# Patient Record
Sex: Male | Born: 1955 | Race: White | Hispanic: No | Marital: Married | Smoking: Former smoker
Health system: Southern US, Community
[De-identification: ages and names within clinical notes are randomized; demographics above are authoritative.]

## PROBLEM LIST (undated history)

## (undated) DIAGNOSIS — R7303 Prediabetes: Principal | ICD-10-CM

## (undated) DIAGNOSIS — C4491 Basal cell carcinoma of skin, unspecified: Secondary | ICD-10-CM

## (undated) DIAGNOSIS — I499 Cardiac arrhythmia, unspecified: Secondary | ICD-10-CM

## (undated) DIAGNOSIS — Z9889 Other specified postprocedural states: Secondary | ICD-10-CM

## (undated) DIAGNOSIS — K219 Gastro-esophageal reflux disease without esophagitis: Secondary | ICD-10-CM

## (undated) DIAGNOSIS — E739 Lactose intolerance, unspecified: Secondary | ICD-10-CM

## (undated) DIAGNOSIS — D649 Anemia, unspecified: Secondary | ICD-10-CM

## (undated) DIAGNOSIS — K5790 Diverticulosis of intestine, part unspecified, without perforation or abscess without bleeding: Secondary | ICD-10-CM

## (undated) DIAGNOSIS — R739 Hyperglycemia, unspecified: Secondary | ICD-10-CM

## (undated) DIAGNOSIS — E785 Hyperlipidemia, unspecified: Secondary | ICD-10-CM

## (undated) DIAGNOSIS — K635 Polyp of colon: Secondary | ICD-10-CM

## (undated) DIAGNOSIS — I10 Essential (primary) hypertension: Secondary | ICD-10-CM

## (undated) DIAGNOSIS — Z86010 Personal history of colonic polyps: Secondary | ICD-10-CM

## (undated) HISTORY — DX: Lactose intolerance, unspecified: E73.9

## (undated) HISTORY — DX: Prediabetes: R73.03

## (undated) HISTORY — PX: NASAL SEPTUM SURGERY: SHX37

## (undated) HISTORY — DX: Anemia, unspecified: D64.9

## (undated) HISTORY — DX: Basal cell carcinoma of skin, unspecified: C44.91

## (undated) HISTORY — DX: Gastro-esophageal reflux disease without esophagitis: K21.9

## (undated) HISTORY — DX: Polyp of colon: K63.5

## (undated) HISTORY — DX: Diverticulosis of intestine, part unspecified, without perforation or abscess without bleeding: K57.90

## (undated) HISTORY — DX: Essential (primary) hypertension: I10

## (undated) HISTORY — DX: Personal history of colonic polyps: Z86.010

## (undated) HISTORY — DX: Hyperglycemia, unspecified: R73.9

## (undated) HISTORY — DX: Hyperlipidemia, unspecified: E78.5

## (undated) HISTORY — DX: Other specified postprocedural states: Z98.890

---

## 1993-07-01 HISTORY — PX: INGUINAL HERNIA REPAIR: SHX194

## 2004-05-01 HISTORY — PX: CYSTECTOMY: SHX5119

## 2004-05-03 ENCOUNTER — Ambulatory Visit (HOSPITAL_BASED_OUTPATIENT_CLINIC_OR_DEPARTMENT_OTHER): Admission: RE | Admit: 2004-05-03 | Discharge: 2004-05-03 | Payer: Self-pay | Admitting: Surgery

## 2004-05-03 ENCOUNTER — Ambulatory Visit (HOSPITAL_COMMUNITY): Admission: RE | Admit: 2004-05-03 | Discharge: 2004-05-03 | Payer: Self-pay | Admitting: Surgery

## 2004-05-03 ENCOUNTER — Encounter (INDEPENDENT_AMBULATORY_CARE_PROVIDER_SITE_OTHER): Payer: Self-pay | Admitting: *Deleted

## 2009-02-06 ENCOUNTER — Ambulatory Visit: Payer: Self-pay | Admitting: Internal Medicine

## 2009-02-06 DIAGNOSIS — I10 Essential (primary) hypertension: Secondary | ICD-10-CM | POA: Insufficient documentation

## 2009-02-06 DIAGNOSIS — E663 Overweight: Secondary | ICD-10-CM | POA: Insufficient documentation

## 2009-02-06 DIAGNOSIS — E785 Hyperlipidemia, unspecified: Secondary | ICD-10-CM | POA: Insufficient documentation

## 2009-02-06 DIAGNOSIS — E7800 Pure hypercholesterolemia, unspecified: Secondary | ICD-10-CM | POA: Insufficient documentation

## 2009-02-06 DIAGNOSIS — J45909 Unspecified asthma, uncomplicated: Secondary | ICD-10-CM

## 2009-02-06 DIAGNOSIS — K219 Gastro-esophageal reflux disease without esophagitis: Secondary | ICD-10-CM

## 2009-02-06 DIAGNOSIS — D649 Anemia, unspecified: Secondary | ICD-10-CM

## 2009-02-06 HISTORY — DX: Gastro-esophageal reflux disease without esophagitis: K21.9

## 2009-02-06 HISTORY — DX: Unspecified asthma, uncomplicated: J45.909

## 2009-02-06 HISTORY — DX: Pure hypercholesterolemia, unspecified: E78.00

## 2009-02-06 HISTORY — DX: Overweight: E66.3

## 2009-02-06 HISTORY — DX: Essential (primary) hypertension: I10

## 2009-02-06 HISTORY — DX: Anemia, unspecified: D64.9

## 2009-02-08 ENCOUNTER — Encounter: Payer: Self-pay | Admitting: Internal Medicine

## 2009-02-21 ENCOUNTER — Ambulatory Visit: Payer: Self-pay | Admitting: Internal Medicine

## 2009-02-22 LAB — CONVERTED CEMR LAB
ALT: 33 units/L (ref 0–53)
AST: 24 units/L (ref 0–37)
Albumin: 4.6 g/dL (ref 3.5–5.2)
Alkaline Phosphatase: 50 units/L (ref 39–117)
Basophils Absolute: 0 10*3/uL (ref 0.0–0.1)
Calcium: 9.6 mg/dL (ref 8.4–10.5)
Chloride: 106 meq/L (ref 96–112)
Cholesterol: 206 mg/dL — ABNORMAL HIGH (ref 0–200)
Creatinine, Ser: 0.87 mg/dL (ref 0.40–1.50)
Eosinophils Absolute: 0.2 10*3/uL (ref 0.0–0.7)
HDL: 39 mg/dL — ABNORMAL LOW (ref >39)
LDL Cholesterol: 114 mg/dL — ABNORMAL HIGH (ref 0–99)
Lymphs Abs: 1.7 10*3/uL (ref 0.7–4.0)
MCV: 87.5 fL (ref 78.0–100.0)
Neutrophils Relative %: 57 % (ref 43–77)
Platelets: 152 10*3/uL (ref 150–400)
Total Protein: 7.2 g/dL (ref 6.0–8.3)
Triglycerides: 264 mg/dL — ABNORMAL HIGH (ref <150)
WBC: 5.2 10*3/uL (ref 4.0–10.5)

## 2009-03-07 ENCOUNTER — Ambulatory Visit: Payer: Self-pay | Admitting: Internal Medicine

## 2009-03-07 ENCOUNTER — Encounter: Payer: Self-pay | Admitting: Internal Medicine

## 2009-03-07 DIAGNOSIS — Z86010 Personal history of colonic polyps: Secondary | ICD-10-CM

## 2009-03-07 DIAGNOSIS — Z860101 Personal history of adenomatous and serrated colon polyps: Secondary | ICD-10-CM

## 2009-03-07 HISTORY — DX: Personal history of adenomatous and serrated colon polyps: Z86.0101

## 2009-03-07 HISTORY — DX: Personal history of colonic polyps: Z86.010

## 2009-03-12 ENCOUNTER — Encounter: Payer: Self-pay | Admitting: Internal Medicine

## 2009-03-20 ENCOUNTER — Ambulatory Visit: Payer: Self-pay | Admitting: Internal Medicine

## 2009-03-20 DIAGNOSIS — L570 Actinic keratosis: Secondary | ICD-10-CM | POA: Insufficient documentation

## 2009-03-20 DIAGNOSIS — L989 Disorder of the skin and subcutaneous tissue, unspecified: Secondary | ICD-10-CM

## 2009-03-20 HISTORY — DX: Disorder of the skin and subcutaneous tissue, unspecified: L98.9

## 2009-03-20 HISTORY — DX: Actinic keratosis: L57.0

## 2009-03-28 ENCOUNTER — Telehealth: Payer: Self-pay | Admitting: Internal Medicine

## 2009-08-25 ENCOUNTER — Encounter: Payer: Self-pay | Admitting: Internal Medicine

## 2009-09-20 ENCOUNTER — Telehealth: Payer: Self-pay | Admitting: Internal Medicine

## 2009-09-28 ENCOUNTER — Encounter: Payer: Self-pay | Admitting: Internal Medicine

## 2009-10-16 ENCOUNTER — Ambulatory Visit: Payer: Self-pay | Admitting: Family

## 2009-10-16 LAB — CONVERTED CEMR LAB
BUN: 16 mg/dL (ref 6–23)
CO2: 26 meq/L (ref 19–32)
Calcium: 9 mg/dL (ref 8.4–10.5)
Creatinine, Ser: 1 mg/dL (ref 0.40–1.50)
Glucose, Bld: 93 mg/dL (ref 70–99)

## 2009-10-24 ENCOUNTER — Encounter: Payer: Self-pay | Admitting: Internal Medicine

## 2009-12-18 ENCOUNTER — Telehealth: Payer: Self-pay | Admitting: Internal Medicine

## 2010-01-03 ENCOUNTER — Encounter: Payer: Self-pay | Admitting: Internal Medicine

## 2010-01-03 LAB — CONVERTED CEMR LAB
ALT: 33 units/L (ref 0–53)
AST: 24 units/L (ref 0–37)
Albumin: 4.7 g/dL (ref 3.5–5.2)
Alkaline Phosphatase: 46 units/L (ref 39–117)
Calcium: 9.4 mg/dL (ref 8.4–10.5)
Cholesterol: 228 mg/dL — ABNORMAL HIGH (ref 0–200)
Creatinine, Ser: 0.84 mg/dL (ref 0.40–1.50)
HDL: 31 mg/dL — ABNORMAL LOW (ref >39)
Sodium: 141 meq/L (ref 135–145)
TSH: 1.835 microintl units/mL (ref 0.350–4.500)
Total CHOL/HDL Ratio: 7.4

## 2010-01-11 ENCOUNTER — Ambulatory Visit: Payer: Self-pay | Admitting: Internal Medicine

## 2010-01-11 ENCOUNTER — Telehealth: Payer: Self-pay | Admitting: Internal Medicine

## 2010-08-02 NOTE — Assessment & Plan Note (Signed)
Summary: CPX/MHF   Vital Signs:  Patient profile:   55 year old male Height:      67 inches Weight:      214 pounds BMI:     33.64 Temp:     97.9 degrees F oral Pulse rate:   76 / minute Pulse rhythm:   regular Resp:     22 per minute BP sitting:   132 / 80  (left arm) Cuff size:   large  Vitals Entered By: Glendell Docker CMA (January 11, 2010 8:36 AM) CC: Rm 2- CPX Is Patient Diabetic? No Pain Assessment Patient in pain? no      Comments medication refills on all meds   Primary Care Provider:  DThomos Lemons DO  CC:  Rm 2- CPX.  History of Present Illness: 55 y/o male for f/u seen by Dr. Terri Piedra skin biopsy - right nostril showed fibrous papule  Htn - stable Hyperlipidemia - stable  Preventive Screening-Counseling & Management  Alcohol-Tobacco     Alcohol drinks/day: 0     Smoking Status: quit  Caffeine-Diet-Exercise     Caffeine use/day: 2-3 beverages daily     Does Patient Exercise: no  Allergies: 1)  ! 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   Head:  normocephalic and atraumatic.   Eyes:  pupils equal, pupils round, and pupils reactive to light.   Ears:  not able to hear finger rub left ear Mouth:  Oral  mucosa and oropharynx without lesions or exudates.  Teeth in good repair. Lungs:  normal respiratory effort and normal breath sounds.   Heart:  normal rate, regular rhythm, and no gallop.   Abdomen:  soft, non-tender, normal bowel sounds, no masses, no hepatomegaly, and no splenomegaly.   Extremities:  No lower extremity edema  Neurologic:  cranial nerves II-XII intact and gait normal.   Psych:  normally interactive, good eye contact, not anxious appearing, and not depressed appearing.     Contraindications/Deferment of Procedures/Staging:    Test/Procedure: FLU VAX    Reason for deferment: patient declined   Impression & Recommendations:  Problem # 1:  HEALTH MAINTENANCE EXAM (ICD-V70.0) Reviewed adult health maintenance protocols. I urged reg exercise LDL elevated.  use pravastatin  Orders: EKG w/ Interpretation (93000) Reviewed adult health maintenance protocols.  Colonoscopy: 1) Three polyps removed, maximum size 6 mm. AENOMA AND HYPERPLASTIC 2) Severe diverticulosis in the sigmoid colon 3) Otherwise normal examination (03/07/2009) Td Booster: Tdap (01/11/2010)   Flu Vax: Declined (01/11/2010)   Chol: 228 (01/03/2010)   HDL: 31 (01/03/2010)   LDL: 160 (01/03/2010)   TG: 187 (01/03/2010) TSH: 1.835 (01/03/2010)   HgbA1C: 5.5 (02/06/2009)   PSA: 0.95 (01/03/2010) Next Colonoscopy due:: 03/2014 (03/07/2009)  Complete  Medication List: 1)  Fexofenadine Hcl 180 Mg Tabs (Fexofenadine hcl) .... One by mouth qd 2)  Aspirin Ec Low Dose 81 Mg Tbec (Aspirin) .... Take 1 tablet by mouth once a day 3)  Famotidine 20 Mg Tabs (Famotidine) .... Take 1 tablet by mouth once a day 4)  Benicar Hct 20-12.5 Mg Tabs (Olmesartan medoxomil-hctz) .... One by mouth qd 5)  Amlodipine Besylate 5 Mg Tabs (Amlodipine besylate) .... One by mouth once daily 6)  Proair Hfa 108 (90 Base) Mcg/act Aers (Albuterol sulfate) .... 2 puffs every 6 hours as needed 7)  Pravastatin Sodium 40 Mg Tabs (Pravastatin  sodium) .... One by mouth qpm  Other Orders: Tdap => 66yrs IM (72536) Admin 1st Vaccine (64403) Future Orders: T-Lipid Profile ([id number]) ... 07/02/2010 T-ALT/SGPT ([id number]) ... 07/02/2010 T-AST/SGOT ([id number]) ... 07/02/2010  Patient Instructions: 1)  Please schedule a follow-up appointment in 6 months. 2)  Lipid Panel prior to visit, ICD-9: 272.4 3)  AST, ALT:  272.4 4)  Please return for lab work one (1) week before your next appointment.  Prescriptions: AMLODIPINE BESYLATE 5 MG TABS (AMLODIPINE BESYLATE) one by mouth once daily  #90 x 3   Entered and Authorized by:   D. Thomos Lemons DO   Signed by:   D. Thomos Lemons DO on 01/11/2010   Method used:   Electronically to        [facility] [phone]* (retail)       [address]       [place], [place]  [address]       Ph: [phone]       Fax: [phone]   RxID:   [id number] FEXOFENADINE HCL 180 MG TABS (FEXOFENADINE HCL) one by mouth qd  #90 x 3   Entered and Authorized by:   D. Thomos Lemons DO   Signed by:   D. Thomos Lemons DO on 01/11/2010   Method used:   Electronically to        [facility] #[id number]* (retail)       [address]       [place], [place]  [address]       Ph: [phone]       Fax: [phone]   RxID:   [id number] BENICAR HCT 20-12.5 MG TABS (OLMESARTAN MEDOXOMIL-HCTZ) one by mouth qd  #90 x 3   Entered and Authorized by:   D. Thomos Lemons DO   Signed by:   D. Thomos Lemons DO on 01/11/2010   Method used:   Electronically to        [facility] [phone]* (retail)       [address]       [place], [place]  [address]       Ph: [phone]       Fax: [phone]   RxID:   [id number] PRAVASTATIN SODIUM 40 MG TABS (PRAVASTATIN SODIUM) one by mouth qpm  #90 x 3   Entered and Authorized by:   D. Thomos Lemons DO   Signed by:   D. Thomos Lemons DO on 01/11/2010   Method  used:   Electronically to        [facility] #[id number]* (retail)       [address]       [place], [place]  [address]       Ph: [phone]       Fax: [phone]   RxID:   [id number]   Current Allergies (reviewed today): ! SULFA   Preventive Care Screening  Last Flu Shot:    Date:  01/11/2010    Results:  Declined    Immunizations Administered:  Tetanus Vaccine:    Vaccine Type: Tdap    Site: left deltoid    Mfr: GlaxoSmithKline    Dose: 0.5 ml    Route: IM    Given by: Mervin Kung CMA (AAMA)    Exp. Date: 09/22/2011    Lot #: [id number]

## 2010-08-02 NOTE — Progress Notes (Signed)
Summary: LAB ORDER FOR CPX   Phone Note Call from Patient   Caller: Patient Call For: YOO  Summary of Call: PATIENT IS SCHEDULED FOR CPX WITH DR YOO ON 7-14.  NEEDS HIS LAB ORDER SENT TO THE LAB IN [place].  Initial call taken by: Roselle Locus,  December 18, 2009 2:21 PM  Follow-up for Phone Call        BMP prior to visit, ICD-9:  401.9 Hepatic Panel prior to visit, ICD-9:  272.4 Lipid Panel prior to visit, ICD-9:  272.4 TSH prior to visit, ICD-9: 272.4 PSA: v76.44 Follow-up by: D. Thomos Lemons DO,  December 18, 2009 5:43 PM  Additional Follow-up for Phone Call Additional follow up Details #1::        Labs entered for [facility] the week of July 5th Additional Follow-up by: Glendell Docker CMA,  December 19, 2009 7:37 AM

## 2010-08-02 NOTE — Progress Notes (Signed)
Summary: cpx coding   Phone Note Call from Patient   Caller: patient  Call For: yoo  Summary of Call: He says his CPX needs to be coded as preventive care V code so that they will pay for the whole visit Initial call taken by: Roselle Locus,  January 11, 2010 9:36 AM

## 2010-08-02 NOTE — Medication Information (Signed)
Summary: Possible Nonadherence with Amlodipine/[employer]  Possible Nonadherence with Amlodipine/[employer]   Imported By: Lanelle Bal 08/29/2009 12:14:24  _____________________________________________________________________  External Attachment:    Type:   Image     Comment:   External Document

## 2010-08-02 NOTE — Medication Information (Signed)
Summary: Generic Meds Report/[employer]  Generic Meds Report/[employer]   Imported By: Lanelle Bal 10/31/2009 13:39:11  _____________________________________________________________________  External Attachment:    Type:   Image     Comment:   External Document

## 2010-08-02 NOTE — Consult Note (Signed)
Summary: [facility]  [facility]   Imported By: Lanelle Bal 11/06/2009 12:15:32  _____________________________________________________________________  External Attachment:    Type:   Image     Comment:   External Document

## 2010-08-02 NOTE — Progress Notes (Signed)
Summary: Follow up appointment  Phone Note Outgoing Call   Call placed by: Glendell Docker CMA,  September 20, 2009 9:13 AM Call placed to: Patient Summary of Call: attempted to contact patient at [phone] no answer, voice message left informing patient he is due for 6 month follow up and labs, please call to schedule Initial call taken by: Glendell Docker CMA,  September 20, 2009 9:14 AM

## 2010-08-02 NOTE — Assessment & Plan Note (Signed)
Summary: fu meds/dt   Vital Signs:  Patient profile:   55 year old male Height:      67 inches Weight:      214.50 pounds BMI:     33.72 Temp:     98.1 degrees F oral Pulse rate:   96 / minute Pulse rhythm:   regular Resp:     16 per minute BP sitting:   140 / 80  (right arm) Cuff size:   large  Vitals Entered By: Mervin Kung CMA (October 16, 2009 3:12 PM) CC: room 6   Follow up.  Pt needs refills on fexofenadine, Benicar HCT and Amlodipine.  Pt. would also like an rx. for an asthma inhaler to use as needed. Is Patient Diabetic? No   Primary Care Provider:  Dondra Spry DO  CC:  room 6   Follow up.  Pt needs refills on fexofenadine and Benicar HCT and Amlodipine.  Pt. would also like an rx. for an asthma inhaler to use as needed.[name]  History of Present Illness: Aaron Ruiz is a 55 year old patient who presents today for follow up of his Asthma and HTN.   Asthma-  notes that this flares up when he does outside yard work without a mask.  He is requesting a prescription for Albuterol.    HTN- denies dizziness  or swelling in his ankles (improved after he started cutting amlodipine 10mg  in half)  Allergies: 1)  ! Sulfa  Physical Exam  General:  Well-developed,well-nourished,in no acute distress; alert,appropriate and cooperative throughout examination Nose:  Pearl- like lesion on right side of his nose. Lungs:  Normal respiratory effort, chest expands symmetrically. Lungs are clear to auscultation, no crackles or wheezes. Heart:  Normal rate and regular rhythm. S1 and S2 normal without gallop, murmur, click, rub or other extra sounds.   Impression & Recommendations:  Problem # 1:  HYPERTENSION (ICD-401.9) Assessment Deteriorated  Notes that he has been using sinus preps OTC.   This is likely contributing to his elevated BP.  Patient instructed to D/C OTC sinus preps and to limit sodium in his diet.  Will monitor.  If BP remains elevated next visit patient will need to  adjust medications.  check  BMET His updated medication list for this problem includes:    Benicar Hct 20-12.5 Mg Tabs (Olmesartan medoxomil-hctz) ..... One by mouth qd    Amlodipine Besylate 10 Mg Tabs (Amlodipine besylate) ..... One half tab by mouth once daily  Orders: T-Basic Metabolic Panel [id number])  BP today: 140/80 Prior BP: 130/70 (03/20/2009)  Labs Reviewed: K+: 4.5 (02/06/2009) Creat: : 0.87 (02/06/2009)   Chol: 206 (02/06/2009)   HDL: 39 (02/06/2009)   LDL: 114 (02/06/2009)   TG: 264 (02/06/2009)  Problem # 2:  SKIN LESION (ICD-709.9) Assessment: Unchanged Patient is agreeable to Derm referral.   Orders: Dermatology Referral (Derma)  Problem # 3:  ASTHMA, INTERMITTENT, MILD (ICD-493.90) Assessment: Unchanged Will proved rx for as needed pro-air His updated medication list for this problem includes:    Proair Hfa 108 (90 Base) Mcg/act Aers (Albuterol sulfate) .[name]... 2 puffs every 6 hours as needed  Complete Medication List: 1)  Fexofenadine Hcl 180 Mg Tabs (Fexofenadine hcl) .... One by mouth qd 2)  Aspirin Ec Low Dose 81 Mg Tbec (Aspirin) .... Take 1 tablet by mouth once a day 3)  Ranitidine Hcl 150 Mg Tabs (Ranitidine hcl) .... One by mouth bid 4)  Benicar Hct 20-12.5 Mg Tabs (Olmesartan medoxomil-hctz) .... One  by mouth qd 5)  Amlodipine Besylate 10 Mg Tabs (Amlodipine besylate) .... One half tab by mouth once daily 6)  Proair Hfa 108 (90 Base) Mcg/act Aers (Albuterol sulfate) .... 2 puffs every 6 hours as needed  Patient Instructions: 1)  Please return in 3 months for a complete physical.   2)  You will be contacted about your dermatology referral.   Prescriptions: PROAIR HFA 108 (90 BASE) MCG/ACT AERS (ALBUTEROL SULFATE) 2 puffs every 6 hours as needed  #1 x 0   Entered and Authorized by:   Lemont Fillers FNP   Signed by:   Lemont Fillers FNP on 10/16/2009   Method used:   Print then Give to Patient   RxID:   [id number] AMLODIPINE  BESYLATE 10 MG TABS (AMLODIPINE BESYLATE) one half tab by mouth once daily  #30 x 3   Entered and Authorized by:   Lemont Fillers FNP   Signed by:   Lemont Fillers FNP on 10/16/2009   Method used:   Electronically to        [facility] #[id number]* (retail)       [address]       [place], [place]  [address]       Ph: [phone]       Fax: [phone]   RxID:   [id number] BENICAR HCT 20-12.5 MG TABS (OLMESARTAN MEDOXOMIL-HCTZ) one by mouth qd  #30.0 Each x 3   Entered and Authorized by:   Lemont Fillers FNP   Signed by:   Lemont Fillers FNP on 10/16/2009   Method used:   Electronically to        [facility] #[id number]* (retail)       [address]       [place], [place]  [address]       Ph: [phone]       Fax: [phone]   RxID:   [id number] FEXOFENADINE HCL 180 MG TABS (FEXOFENADINE HCL) one by mouth qd  #30 x 3   Entered and Authorized by:   Lemont Fillers FNP   Signed by:   Lemont Fillers FNP on 10/16/2009   Method used:   Electronically to        [facility] #[id number]* (retail)       [address]       [place], [place]  [address]       Ph: [phone]       Fax: [phone]   RxID:   [id number]   Current Allergies (reviewed today): ! SULFA

## 2010-08-28 ENCOUNTER — Encounter: Payer: Self-pay | Admitting: Internal Medicine

## 2010-09-18 NOTE — Letter (Signed)
Summary: [facility]   Imported By: Maryln Gottron 09/10/2010 11:14:12  _____________________________________________________________________  External Attachment:    Type:   Image     Comment:   External Document

## 2010-11-05 ENCOUNTER — Telehealth: Payer: Self-pay | Admitting: Internal Medicine

## 2010-11-05 DIAGNOSIS — E785 Hyperlipidemia, unspecified: Secondary | ICD-10-CM

## 2010-11-05 NOTE — Telephone Encounter (Signed)
PLEASE FAX LAB ORDER TO [facility] PATIENT IS HAVING CPE ON 5-16 AND WILL GO THIS WEEK FOR LABS DOWNSTAIRS

## 2010-11-09 LAB — LIPID PANEL
HDL: 31 mg/dL — ABNORMAL LOW (ref >39)
LDL Cholesterol: 113 mg/dL — ABNORMAL HIGH (ref 0–99)
Triglycerides: 201 mg/dL — ABNORMAL HIGH (ref <150)
VLDL: 40 mg/dL (ref 0–40)

## 2010-11-09 LAB — AST: AST: 22 U/L (ref 0–37)

## 2010-11-09 LAB — ALT: ALT: 31 U/L (ref 0–53)

## 2010-11-13 ENCOUNTER — Encounter: Payer: Self-pay | Admitting: Internal Medicine

## 2010-11-14 ENCOUNTER — Ambulatory Visit (INDEPENDENT_AMBULATORY_CARE_PROVIDER_SITE_OTHER): Payer: Managed Care, Other (non HMO) | Admitting: Family

## 2010-11-14 ENCOUNTER — Encounter: Payer: Self-pay | Admitting: Internal Medicine

## 2010-11-14 ENCOUNTER — Encounter: Payer: Self-pay | Admitting: Family

## 2010-11-14 DIAGNOSIS — I10 Essential (primary) hypertension: Secondary | ICD-10-CM

## 2010-11-14 DIAGNOSIS — J45909 Unspecified asthma, uncomplicated: Secondary | ICD-10-CM

## 2010-11-14 DIAGNOSIS — E785 Hyperlipidemia, unspecified: Secondary | ICD-10-CM

## 2010-11-14 DIAGNOSIS — Z0000 Encounter for general adult medical examination without abnormal findings: Secondary | ICD-10-CM

## 2010-11-14 HISTORY — DX: Encounter for general adult medical examination without abnormal findings: Z00.00

## 2010-11-14 LAB — BASIC METABOLIC PANEL
BUN: 18 mg/dL (ref 6–23)
CO2: 25 mEq/L (ref 19–32)
Chloride: 102 mEq/L (ref 96–112)
Glucose, Bld: 85 mg/dL (ref 70–99)
Potassium: 5 mEq/L (ref 3.5–5.3)
Sodium: 140 mEq/L (ref 135–145)

## 2010-11-14 LAB — TSH: TSH: 1.93 u[IU]/mL (ref 0.350–4.500)

## 2010-11-14 MED ORDER — AMLODIPINE BESYLATE 5 MG PO TABS: 5.0000 mg | ORAL_TABLET | Freq: Every day | ORAL | Status: DC

## 2010-11-14 MED ORDER — LOSARTAN POTASSIUM-HCTZ 50-12.5 MG PO TABS: 1.0000 | ORAL_TABLET | Freq: Every day | ORAL | Status: DC

## 2010-11-14 MED ORDER — ALBUTEROL SULFATE HFA 108 (90 BASE) MCG/ACT IN AERS: 2.0000 | INHALATION_SPRAY | Freq: Four times a day (QID) | RESPIRATORY_TRACT | Status: DC | PRN

## 2010-11-14 MED ORDER — PRAVASTATIN SODIUM 40 MG PO TABS: 40.0000 mg | ORAL_TABLET | Freq: Every evening | ORAL | Status: DC

## 2010-11-14 NOTE — Patient Instructions (Addendum)
Please work hard on diet, exercise and weight loss. Try to exercise 20 minutes a day 5 days a week. Follow up with Dr. Artist Pais in 2 months, sooner if problems or concerns.

## 2010-11-14 NOTE — Assessment & Plan Note (Signed)
Stable, continue PRN albuterol.  

## 2010-11-14 NOTE — Progress Notes (Signed)
Subjective:    Patient ID: Aaron Ruiz, male    DOB: 1955-10-29, 55 y.o.   MRN: [id number]  HPI  [name] is a 55 year old male who presents today for CPX.  Not exercising due to knee pain.   Diet- is improving.  Eats too much red meat- not as much fried foods as he used to.  Grills frequently.  Eats chicken several times a week.  Meats are grilled out. Needs to eat more veggies. Tries not to snack.  Diet sodas, no juices.  Colo, tetanus up to date.   HTN- he is requesting switch to losartan HCTZ in place of benicar due to insurance coverage.   Asthma- he reports that this has generally been well controlled.  Requesting refill on his albuterol MDI  Review of Systems  Constitutional: Negative for fever.  HENT: Positive for hearing loss.        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    Eyes:       Wears contacts  Respiratory: Negative for cough and shortness of breath.   Cardiovascular: Negative for chest pain and leg swelling.  Gastrointestinal: Negative for vomiting, diarrhea, constipation and blood in stool.  Genitourinary: Negative for dysuria and frequency.  Musculoskeletal:       Occasional pain in the left ring finger.  Injury as a child- broke that finger. Chronic pain in right knee.  Skin:       Denies rashes or concerning lesions.  Neurological: Negative for weakness and numbness.  Hematological: Negative for adenopathy.  Psychiatric/Behavioral:       Denies depression or anxiety   Past Medical History  Diagnosis Date  . Anemia   . Asthma   . GERD (gastroesophageal reflux disease)   . Hyperlipidemia   . Hypertension   . Diverticulosis   . Colon polyps     History   Social History  . Marital Status: Married    Spouse Name: N/A    Number of Children: N/A  . Years of Education: N/A   Occupational History  . Not on file.   Social History Main Topics  . Smoking status: Former Games developer  . Smokeless tobacco: Not on file  . Alcohol Use: No  .  Drug Use: Not on file  . Sexually Active: Not on file   Other Topics Concern  . 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 Inguinal hernia repair 1995  . Cystectomy 05/2004    removed fromback    Family History  Problem Relation Age of Onset  . Coronary artery disease Father     CABG X5  . Diabetes Father   . Hypertension    . Kidney disease    . Colon cancer Maternal Aunt     Allergies  Allergen Reactions  . Sulfonamide Derivatives     REACTION: Hallucination    Current Outpatient Prescriptions on File Prior to Visit  Medication Sig Dispense Refill  . albuterol (PROAIR HFA) 108 (90 BASE) MCG/ACT inhaler Inhale 2 puffs into the lungs every 6 (six) hours as needed.        [name] amLODipine (NORVASC) 5 MG tablet Take 5  mg by mouth daily.        [name] aspirin 81 MG tablet Take 81 mg by mouth daily.        . famotidine (PEPCID) 20 MG tablet Take 20 mg by mouth 2 (two) times daily.       . fexofenadine (ALLEGRA) 180 MG tablet Take 180 mg by mouth daily.        [name] olmesartan-hydrochlorothiazide (BENICAR HCT) 20-12.5 MG per tablet Take 1 tablet by mouth daily.        . pravastatin (PRAVACHOL) 40 MG tablet Take 40 mg by mouth every evening.          BP 140/86  Pulse 78  Temp(Src) 97.8 F (36.6 C) (Oral)  Resp 16  Ht 5' 7.01" (1.702 m)  Wt 213 lb 1.3 oz (96.652 kg)  BMI 33.36 kg/m2       Objective:   Physical Exam  Constitutional: He is oriented to person, place, and time. He appears well-developed and well-nourished.  HENT:  Head: Normocephalic and atraumatic.  Right Ear: Tympanic membrane and ear canal normal.  Left Ear: Tympanic membrane and ear canal normal.  Eyes: Conjunctivae are normal. Pupils  are equal, round, and reactive to light.  Neck: Normal range of motion. Neck supple. No thyromegaly present.  Cardiovascular: Normal rate and regular rhythm.   No murmur heard. Pulmonary/Chest: No respiratory distress. He has no wheezes.  Abdominal: Soft. Bowel sounds are normal.  Musculoskeletal: Normal range of motion. He exhibits no edema.  Neurological: He is alert and oriented to person, place, and time. He has normal reflexes.  Skin: Skin is warm and dry.  Psychiatric: He has a normal mood and affect. His behavior is normal. Judgment and thought content normal.          Assessment & Plan:

## 2010-11-14 NOTE — Assessment & Plan Note (Signed)
Patient was counseled on diet, exercise and weight loss. PSA to be drawn today.  Immunizations reviewed and up to date. Colo up to date.

## 2010-11-14 NOTE — Assessment & Plan Note (Addendum)
BP Readings from Last 3 Encounters:  11/14/10 140/86  01/11/10 132/80  10/16/09 140/80   Reasonable BP control.  Pt just filled his benicar HCT.  He will completed benicar and then start losartan HCTZ-  F/u with Dr. Artist Pais 1 month after he starts losartan HCTZ

## 2010-11-14 NOTE — Assessment & Plan Note (Signed)
Triglycerides slightly elevated.  We discussed dietary changes that he should make.

## 2010-11-16 ENCOUNTER — Encounter: Payer: Self-pay | Admitting: Family

## 2010-11-16 NOTE — Op Note (Signed)
NAME:  Aaron Ruiz, Aaron Ruiz              ACCOUNT NO.:  [id number]   MEDICAL RECORD NO.:  [id number]          PATIENT TYPE:  AMB   LOCATION:  [facility]                          FACILITY:  [facility]   PHYSICIAN:  Sandria Bales. Ezzard Standing, M.D.  DATE OF BIRTH:  03/15/1956   DATE OF PROCEDURE:  05/03/2004  DATE OF DISCHARGE:                                 OPERATIVE REPORT   PREOPERATIVE DIAGNOSIS:  A 3.5 cm cyst, right back.   POSTOPERATIVE DIAGNOSIS:  A 3.5 cm cyst, right back.   OPERATION PERFORMED:  Incision of cyst, right back.   SURGEON:  Sandria Bales. Ezzard Standing, M.D.   ANESTHESIA:  Approximately 15 mL of 1% Xylocaine.   COMPLICATIONS:  None.   INDICATIONS FOR PROCEDURE:  [name] is a 55 year old white male who has  a cyst of his back which has gotten slightly larger.  He now comes for  excision of this.  The cyst measures approximately 3.5 to 4 cm.  The  indications and potential complications were explained to the patient.  The  potential complications include but are not limited to bleeding, infection  and recurrence of the cyst.   DESCRIPTION OF PROCEDURE:  With the patient in a prone position, his right  back was prepped with Betadine solution and sterilely draped.  I injected  about 15 mL of 1% Xylocaine, excised the cyst with a small ellipse of skin.  This looked like a large sebaceous cyst.  I got the entire cyst lining out.  I then closed it with interrupted 3-0 Vicryl sutures and a running 4-0  Vicryl suture, painted the wound with tincture of benzoin and steri-  stripped.  The patient tolerated the procedure well.  Return to see me in  two to three weeks for wound check and review with pathology.      Davi   DHN/MEDQ  D:  05/03/2004  T:  05/03/2004  Job:  [id number]   cc:   Vale Haven. Andrey Campanile, M.D.  [address]  [place]  [place] [address]  Fax: [phone]

## 2011-02-10 ENCOUNTER — Other Ambulatory Visit: Payer: Self-pay | Admitting: Family

## 2011-03-11 ENCOUNTER — Telehealth: Payer: Self-pay | Admitting: *Deleted

## 2011-03-11 NOTE — Telephone Encounter (Signed)
Patient called and left voice message requesting a return phone call. His message stated he needed to know if he should be fasting for blood work, and had questions about medication refills.

## 2011-03-12 ENCOUNTER — Other Ambulatory Visit: Payer: Self-pay | Admitting: Family

## 2011-03-12 NOTE — Telephone Encounter (Signed)
OK to give 2 week supply of his medications if he is out- please verify medication with him  He needs a follow up apt.  Was due to return in July.  We can check testosterone at his apt.

## 2011-03-12 NOTE — Telephone Encounter (Signed)
Call returned to patient at [phone]. Patient would like to know if he could get his testosterone checked, he stated the he has had a decrease in energy. He stated that he was informed to return for blood work after being on his medication for awhile.  He wanted to know if a refill for his new  blood pressure medication could be sent to pharmacy. Patient was unable to verify which medication he needed a refill on.

## 2011-03-13 NOTE — Telephone Encounter (Signed)
Call placed to patient at [phone], he was informed per Sandford Craze instructions. He has scheduled follow up appointment for Friday 03/15/2011 @ 3:15 pm.   Patient stated he has contacted the pharmacy regarding the refills needed and we should expect to receive something from them regarding medication refills.

## 2011-03-15 ENCOUNTER — Encounter: Payer: Self-pay | Admitting: Family

## 2011-03-15 ENCOUNTER — Ambulatory Visit (INDEPENDENT_AMBULATORY_CARE_PROVIDER_SITE_OTHER): Payer: Managed Care, Other (non HMO) | Admitting: Family

## 2011-03-15 VITALS — BP 150/96 | HR 84 | Temp 97.8°F | Resp 16 | Ht 67.0 in | Wt 218.0 lb

## 2011-03-15 DIAGNOSIS — R5383 Other fatigue: Secondary | ICD-10-CM

## 2011-03-15 DIAGNOSIS — R5381 Other malaise: Secondary | ICD-10-CM

## 2011-03-15 DIAGNOSIS — I10 Essential (primary) hypertension: Secondary | ICD-10-CM

## 2011-03-15 DIAGNOSIS — E785 Hyperlipidemia, unspecified: Secondary | ICD-10-CM

## 2011-03-15 MED ORDER — LOSARTAN POTASSIUM-HCTZ 50-12.5 MG PO TABS: 1.0000 | ORAL_TABLET | Freq: Every day | ORAL | Status: DC

## 2011-03-15 MED ORDER — AMLODIPINE BESYLATE 10 MG PO TABS: 10.0000 mg | ORAL_TABLET | Freq: Every day | ORAL | Status: DC

## 2011-03-15 NOTE — Patient Instructions (Signed)
Please complete your lab work on the first floor.  Follow up in 1 month.   

## 2011-03-15 NOTE — Progress Notes (Signed)
Subjective:    Patient ID: Aaron Ruiz, male    DOB: 1955-07-07, 55 y.o.   MRN: [id number]  HPI  Patient presents today for followup  1. HTN- Patient has been treated for Chronic HTN for quiet sometime. He is currently on Hyzaar and amlodipine, and is poorly controlled. No associated S/S related to HTN.   Quality: chronic Modifying factor: meds Duration: Quite sometime Associated S/S: None.  The patient denies the following associated symptoms: Chest pain, dyspnea, blurred vision, headache, or lower extremity edema.  Reports BP readings at home run 150's/70's.  2. Fatigue- reports that he has no energy in the evenings.  Falls asleep early in the evenings. Stopped working out several years ago. Denies somnolence. + snoring.  Denies family history of sleep apnea.    3. Hyperlipidemia- continues pravastatin- denies unusual myalgias.        Review of Systems see HPI    Past Medical History  Diagnosis Date  . Anemia   . Asthma   . GERD (gastroesophageal reflux disease)   . Hyperlipidemia   . Hypertension   . Diverticulosis   . Colon polyps     History   Social History  . Marital Status: Married    Spouse Name: N/A    Number of Children: N/A  . Years of Education: N/A   Occupational History  . Not on file.   Social History Main Topics  . Smoking status: Former Games developer  . Smokeless tobacco: Not on file  . Alcohol Use: No  . Drug Use: Not on file  . Sexually Active: Not on file   Other Topics Concern  . 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 Inguinal hernia repair 1995  . Cystectomy 05/2004    removed fromback    Family History  Problem Relation Age of Onset  . Coronary  artery disease Father     CABG X5  . Diabetes Father   . Hypertension    . Kidney disease    . Colon cancer Maternal Aunt     Allergies  Allergen Reactions  . Sulfonamide Derivatives     REACTION: Hallucination    Current Outpatient Prescriptions on File Prior to Visit  Medication Sig Dispense Refill  . albuterol (PROAIR HFA) 108 (90 BASE) MCG/ACT inhaler Inhale 2 puffs into the lungs every 6 (six) hours as needed.  1 Inhaler  2  . aspirin 81 MG tablet Take 81 mg by mouth daily.        . famotidine (PEPCID) 20 MG tablet Take 20 mg by mouth 2 (two) times daily.       . pravastatin (PRAVACHOL) 40 MG tablet TAKE 1 TABLET BY MOUTH EVERY EVENING  30 tablet  4    BP 150/96  Pulse 84  Temp(Src) 97.8 F (36.6 C) (Oral)  Resp 16  Ht 5\' 7"  (1.702 m)  Wt 218 lb (98.884 kg)  BMI 34.14 kg/m2  SpO2 97%    Objective:   Physical Exam  Constitutional: He appears well-developed and well-nourished.  HENT:  Mouth/Throat: Uvula is midline.       Narrow oropharynx. Large tonsils  Neck:       Short thick  neck.   Cardiovascular: Normal rate and regular rhythm.   Pulmonary/Chest: Effort normal and breath sounds normal. No respiratory distress. He has no wheezes. He has no rales. He exhibits no tenderness.  Musculoskeletal: He exhibits no edema.  Psychiatric: He has a normal mood and affect. His behavior is normal. Judgment and thought content normal.          Assessment & Plan:

## 2011-03-15 NOTE — Assessment & Plan Note (Signed)
Uncontrolled.  Continue current dose of Hyzaar, increase amlodipine.  Plan to check bmet next visit.

## 2011-03-15 NOTE — Assessment & Plan Note (Signed)
TSH was performed last visit and was normal.  I am supscious for OSA.  I offered to arrange sleep study.  We discussed risks of untreated sleep apnea.  He declines referral at this time but tells me that he will think about it. Will also check testosterone level and a CBC.

## 2011-03-15 NOTE — Assessment & Plan Note (Signed)
On statin- LDL 113. LDL goal is <130.  Continue same.

## 2011-03-16 LAB — CBC WITH DIFFERENTIAL/PLATELET
Basophils Absolute: 0 10*3/uL (ref 0.0–0.1)
Basophils Relative: 0 % (ref 0–1)
Eosinophils Absolute: 0.2 10*3/uL (ref 0.0–0.7)
Eosinophils Relative: 3 % (ref 0–5)
MCH: 29.4 pg (ref 26.0–34.0)
MCHC: 32.2 g/dL (ref 30.0–36.0)
MCV: 91.2 fL (ref 78.0–100.0)
Neutrophils Relative %: 48 % (ref 43–77)
Platelets: 162 10*3/uL (ref 150–400)
RBC: 4.87 MIL/uL (ref 4.22–5.81)
RDW: 13.8 % (ref 11.5–15.5)

## 2011-03-17 ENCOUNTER — Telehealth: Payer: Self-pay | Admitting: Family

## 2011-03-17 NOTE — Telephone Encounter (Signed)
Please call pt and let him know that his testosterone level is low.  I would like to refer him to urology to be evaluated for testosterone replacement therapy.  Aaron Ruiz will call him about the appointment.

## 2011-03-18 LAB — TESTOSTERONE, FREE, TOTAL, SHBG
Testosterone-% Free: 2.7 % (ref 1.6–2.9)
Testosterone: 133.82 ng/dL — ABNORMAL LOW (ref 250–890)

## 2011-03-18 NOTE — Telephone Encounter (Signed)
Left message on machine to return my call. 

## 2011-03-19 NOTE — Telephone Encounter (Signed)
Left message on machine to return my call. 

## 2011-03-19 NOTE — Telephone Encounter (Signed)
Patient returned phone call. Best # [phone]

## 2011-03-20 NOTE — Telephone Encounter (Signed)
Pt.notified

## 2011-04-23 ENCOUNTER — Other Ambulatory Visit: Payer: Self-pay | Admitting: Family

## 2011-04-24 NOTE — Telephone Encounter (Signed)
Pt last seen 03/15/11 and was due for a 1 month f/u after medication adjustment.  2 week supply of Amlodipine sent to pharmacy. Pt needs appt now. Please call pt to arrange appt.

## 2011-04-24 NOTE — Telephone Encounter (Signed)
Left message for patient to return my call.

## 2011-04-30 ENCOUNTER — Ambulatory Visit (INDEPENDENT_AMBULATORY_CARE_PROVIDER_SITE_OTHER): Payer: Managed Care, Other (non HMO) | Admitting: Family

## 2011-04-30 ENCOUNTER — Encounter: Payer: Self-pay | Admitting: Family

## 2011-04-30 VITALS — BP 134/80 | HR 95 | Temp 98.4°F | Resp 18 | Wt 222.0 lb

## 2011-04-30 DIAGNOSIS — I10 Essential (primary) hypertension: Secondary | ICD-10-CM

## 2011-04-30 DIAGNOSIS — E291 Testicular hypofunction: Secondary | ICD-10-CM

## 2011-04-30 DIAGNOSIS — K219 Gastro-esophageal reflux disease without esophagitis: Secondary | ICD-10-CM

## 2011-04-30 HISTORY — DX: Testicular hypofunction: E29.1

## 2011-04-30 MED ORDER — AMLODIPINE BESYLATE 10 MG PO TABS: 10.0000 mg | ORAL_TABLET | Freq: Every day | ORAL | Status: DC

## 2011-04-30 MED ORDER — LOSARTAN POTASSIUM-HCTZ 50-12.5 MG PO TABS: 1.0000 | ORAL_TABLET | Freq: Every day | ORAL | Status: DC

## 2011-04-30 NOTE — Assessment & Plan Note (Signed)
BP here looks good.  I suspect that the cuff that he is using at home which is a standard cuff, is too small for him and that is why he is seeing high readings. Continue hyzaar and amlodipine and will check BMET.

## 2011-04-30 NOTE — Assessment & Plan Note (Signed)
Stable, continue pepcid and dietary modification.

## 2011-04-30 NOTE — Assessment & Plan Note (Signed)
Pt is receiving testosterone therapy from Dr. Vernie Ammons. Defer management to Urology.

## 2011-04-30 NOTE — Progress Notes (Signed)
Subjective:    Patient ID: Aaron Ruiz, male    DOB: 1956/06/04, 55 y.o.   MRN: [id number]  HPI  Mr. Wolters is a 55 yr old male who presents today for follow up.   1. HTN-  He reports that he has been checking his blood pressure at home and has been getting readings of 155-160/60-70. Currently on Hyzaar and amlodipine which he is tolerating without difficulty. Denies shorteness of breath chest pain or swelling in his feet.  2. Hypogonadism- started Androgel with Dr.  Vernie Ammons who is monitoring his PSA.  Fatigue improving.    3.  GERD- reports that this is stable.  He continues to take pepcid.   Review of Systems See HPI  Past Medical History  Diagnosis Date  . Anemia   . Asthma   . GERD (gastroesophageal reflux disease)   . Hyperlipidemia   . Hypertension   . Diverticulosis   . Colon polyps     History   Social History  . Marital Status: Married    Spouse Name: N/A    Number of Children: N/A  . Years of Education: N/A   Occupational History  . Not on file.   Social History Main Topics  . Smoking status: Former Games developer  . Smokeless tobacco: Never Used  . Alcohol Use: No  . Drug Use: Not on file  . Sexually Active: Not on file   Other Topics Concern  . 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 Inguinal hernia repair 1995  . Cystectomy 05/2004    removed fromback    Family History  Problem Relation Age of Onset  . Coronary artery disease Father     CABG X5  . Diabetes Father   . Hypertension    . Kidney disease    . Colon cancer Maternal Aunt     Allergies  Allergen Reactions  . Sulfonamide Derivatives     REACTION: Hallucination    Current Outpatient Prescriptions  on File Prior to Visit  Medication Sig Dispense Refill  . albuterol (PROAIR HFA) 108 (90 BASE) MCG/ACT inhaler Inhale 2 puffs into the lungs every 6 (six) hours as needed.  1 Inhaler  2  . aspirin 81 MG tablet Take 81 mg by mouth daily.        . cetirizine (ZYRTEC) 10 MG tablet Take 10 mg by mouth daily.        . famotidine (PEPCID) 20 MG tablet Take 20 mg by mouth 2 (two) times daily.       [name] ibuprofen (ADVIL,MOTRIN) 200 MG tablet Take 200 mg by mouth 2 (two) times daily as needed.        . pravastatin (PRAVACHOL) 40 MG tablet TAKE 1 TABLET BY MOUTH EVERY EVENING  30 tablet  4    BP 134/80  Pulse 95  Temp(Src) 98.4 F (36.9 C) (Oral)  Resp 18  Wt 222 lb (100.699 kg)  SpO2 96%       Objective:   Physical Exam  Constitutional: He appears well-developed and well-nourished. No distress.  Cardiovascular: Normal rate and regular rhythm.   No murmur heard. Pulmonary/Chest: Effort normal and breath sounds normal. No respiratory distress. He  has no wheezes. He has no rales. He exhibits no tenderness.  Musculoskeletal: He exhibits no edema.  Skin: Skin is warm and dry.  Psychiatric: He has a normal mood and affect. His behavior is normal. Judgment and thought content normal.          Assessment & Plan:   BP Readings from Last 3 Encounters:  04/30/11 134/80  03/15/11 150/96  11/14/10 140/86

## 2011-04-30 NOTE — Patient Instructions (Signed)
Please complete your blood work today on the first floor.  Follow up in 3 months.

## 2011-05-01 ENCOUNTER — Encounter: Payer: Self-pay | Admitting: Family

## 2011-05-01 ENCOUNTER — Telehealth: Payer: Self-pay | Admitting: Family

## 2011-05-01 DIAGNOSIS — R739 Hyperglycemia, unspecified: Secondary | ICD-10-CM | POA: Insufficient documentation

## 2011-05-01 HISTORY — DX: Hyperglycemia, unspecified: R73.9

## 2011-05-01 LAB — BASIC METABOLIC PANEL
BUN: 15 mg/dL (ref 6–23)
Potassium: 3.7 mEq/L (ref 3.5–5.3)
Sodium: 140 mEq/L (ref 135–145)

## 2011-05-01 NOTE — Telephone Encounter (Signed)
Please call pt and let him know that his sugar was elevated at 145.  Normal is <100.  I would like for him to complete an A1C- diagnosis hyperglycemia.

## 2011-05-01 NOTE — Telephone Encounter (Signed)
Call placed to patient at [phone], no answer. A detailed voice message was left informing patient per Sandford Craze instructions. Lab order entered for [facility][name]

## 2011-05-02 ENCOUNTER — Other Ambulatory Visit: Payer: Self-pay | Admitting: Family

## 2011-05-02 ENCOUNTER — Other Ambulatory Visit: Payer: Self-pay | Admitting: *Deleted

## 2011-05-02 DIAGNOSIS — R739 Hyperglycemia, unspecified: Secondary | ICD-10-CM

## 2011-05-02 LAB — HEMOGLOBIN A1C
Hgb A1c MFr Bld: 6.2 % — ABNORMAL HIGH (ref <5.7)
Mean Plasma Glucose: 131 mg/dL — ABNORMAL HIGH (ref <117)

## 2011-05-03 ENCOUNTER — Encounter: Payer: Self-pay | Admitting: Family

## 2011-05-03 ENCOUNTER — Telehealth: Payer: Self-pay | Admitting: Family

## 2011-05-03 DIAGNOSIS — R7303 Prediabetes: Secondary | ICD-10-CM

## 2011-05-03 HISTORY — DX: Prediabetes: R73.03

## 2011-05-03 NOTE — Telephone Encounter (Signed)
Pls contact pt and let him know that his sugar is elevated in the borderline diabetes range.  I would like to see him back some time in the next 1 month to discuss diabetic diet please.

## 2011-05-03 NOTE — Telephone Encounter (Signed)
Left message on machine to return my call. 

## 2011-05-06 NOTE — Telephone Encounter (Signed)
Pt notified and scheduled f/u for 05/15/11 at 11am.

## 2011-05-15 ENCOUNTER — Telehealth: Payer: Self-pay | Admitting: Family

## 2011-05-15 ENCOUNTER — Encounter: Payer: Self-pay | Admitting: Family

## 2011-05-15 ENCOUNTER — Ambulatory Visit (INDEPENDENT_AMBULATORY_CARE_PROVIDER_SITE_OTHER): Payer: Managed Care, Other (non HMO) | Admitting: Family

## 2011-05-15 VITALS — BP 146/80 | HR 78 | Temp 98.3°F | Resp 18 | Ht 67.0 in | Wt 220.1 lb

## 2011-05-15 DIAGNOSIS — R7309 Other abnormal glucose: Secondary | ICD-10-CM

## 2011-05-15 DIAGNOSIS — R7303 Prediabetes: Secondary | ICD-10-CM

## 2011-05-15 NOTE — Progress Notes (Signed)
Subjective:    Patient ID: Aaron Ruiz, male    DOB: 1956-06-08, 55 y.o.   MRN: [id number]  HPI  Mr.  Seabolt is a 55 yr old male who presents today to discuss his hyperglycemia.  He was noted to have a random sugar of 145. A1C was subsequently performed and was 6.2.  He reports + family hx of diabetes on both sides of his family.  He reports poor diet.  Denies polyuria/polydipsia.  Review of Systems    see HPI  Past Medical History  Diagnosis Date  . Anemia   . Asthma   . GERD (gastroesophageal reflux disease)   . Hyperlipidemia   . Hypertension   . Diverticulosis   . Colon polyps   . Hyperglycemia 05/01/2011  . Borderline diabetes 05/03/2011    History   Social History  . Marital Status: Married    Spouse Name: N/A    Number of Children: N/A  . Years of Education: N/A   Occupational History  . Not on file.   Social History Main Topics  . Smoking status: Former Games developer  . Smokeless tobacco: Never Used  . Alcohol Use: No  . Drug Use: Not on file  . Sexually Active: Not on file   Other Topics Concern  . 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 Inguinal hernia repair 1995  . Cystectomy 05/2004    removed fromback    Family History  Problem Relation Age of Onset  . Coronary artery disease Father     CABG X5  . Diabetes Father   . Hypertension    . Kidney disease    . Colon cancer Maternal Aunt     Allergies  Allergen Reactions  . Sulfonamide Derivatives     REACTION: Hallucination    Current Outpatient Prescriptions on File Prior to Visit  Medication Sig Dispense Refill  . albuterol (PROAIR HFA) 108 (90 BASE) MCG/ACT inhaler Inhale 2 puffs into the lungs every 6 (six)  hours as needed.  1 Inhaler  2  . amLODipine (NORVASC) 10 MG tablet Take 1 tablet (10 mg total) by mouth daily.  30 tablet  2  . aspirin 81 MG tablet Take 81 mg by mouth daily.        . cetirizine (ZYRTEC) 10 MG tablet Take 10 mg by mouth daily.        . famotidine (PEPCID) 20 MG tablet Take 20 mg by mouth 2 (two) times daily.       [name] ibuprofen (ADVIL,MOTRIN) 200 MG tablet Take 200 mg by mouth 2 (two) times daily as needed.        [name] losartan-hydrochlorothiazide (HYZAAR) 50-12.5 MG per tablet Take 1 tablet by mouth daily.  30 tablet  2  . pravastatin (PRAVACHOL) 40 MG tablet TAKE 1 TABLET BY MOUTH EVERY EVENING  30 tablet  4  . Testosterone (ANDROGEL PUMP TD) Place onto the skin. Apply topically to each shoulder once a day.         BP 146/80  Pulse 78  Temp(Src) 98.3 F (36.8 C) (Oral)  Resp 18  Ht 5\' 7"  (1.702 m)  Wt 220 lb 1.3 oz (99.828 kg)  BMI  34.47 kg/m2    Objective:   Physical Exam  Constitutional: He appears well-developed and well-nourished. No distress.  Psychiatric: He has a normal mood and affect. His behavior is normal. Judgment and thought content normal.          Assessment & Plan:   15 minutes were spent with the patient today counseling him on a 1600 calorie diabetic diet, weight loss and exercise.  >50% of this time was spent on counseling. A hand out was provided to the patient today.  Pt is to follow up in 3 months and will complete lab work prior to his apt.

## 2011-05-15 NOTE — Patient Instructions (Signed)
Please follow up in 3 months.  Come to the lab 3 days prior to your apt for your blood work.

## 2011-05-17 ENCOUNTER — Telehealth: Payer: Self-pay | Admitting: *Deleted

## 2011-05-17 DIAGNOSIS — E119 Type 2 diabetes mellitus without complications: Secondary | ICD-10-CM

## 2011-05-17 NOTE — Telephone Encounter (Signed)
Order completed and forwarded to the lab. See previous phone note.

## 2011-05-17 NOTE — Telephone Encounter (Signed)
Message copied by Kathi Simpers on Fri May 17, 2011  9:22 AM ------      Message from: O'SULLIVAN, [name]      Created: Wed May 15, 2011 12:53 PM       Pls send A1C order (250.00) for 3 months to lab.

## 2011-05-17 NOTE — Telephone Encounter (Signed)
Future order entered for the week of 08/16/10 and copy sent to lab.

## 2011-07-09 ENCOUNTER — Other Ambulatory Visit: Payer: Self-pay | Admitting: Family

## 2011-07-23 ENCOUNTER — Encounter: Payer: Self-pay | Admitting: Family

## 2011-07-23 ENCOUNTER — Ambulatory Visit (INDEPENDENT_AMBULATORY_CARE_PROVIDER_SITE_OTHER): Payer: Managed Care, Other (non HMO) | Admitting: Family

## 2011-07-23 DIAGNOSIS — J069 Acute upper respiratory infection, unspecified: Secondary | ICD-10-CM | POA: Insufficient documentation

## 2011-07-23 DIAGNOSIS — E663 Overweight: Secondary | ICD-10-CM

## 2011-07-23 DIAGNOSIS — I10 Essential (primary) hypertension: Secondary | ICD-10-CM

## 2011-07-23 HISTORY — DX: Acute upper respiratory infection, unspecified: J06.9

## 2011-07-23 MED ORDER — LOSARTAN POTASSIUM-HCTZ 100-12.5 MG PO TABS: 1.0000 | ORAL_TABLET | Freq: Every day | ORAL | Status: DC

## 2011-07-23 NOTE — Assessment & Plan Note (Signed)
Unchanged.  Still not at goal of 130/80 or less.  Will increase losartan from 50/12.5 to 100/12.5 and plan to repeat bmet at 1 month follow up visit.

## 2011-07-23 NOTE — Assessment & Plan Note (Signed)
He has lost 10 pounds. I commended him for his efforts and success.  Hopefully his A1C will show improvement next visit as well.

## 2011-07-23 NOTE — Assessment & Plan Note (Signed)
Pt with mild uri symptoms today.  Should improve on own.  Monitor.

## 2011-07-23 NOTE — Progress Notes (Signed)
Subjective:    Patient ID: Aaron Ruiz, male    DOB: 1956-02-04, 56 y.o.   MRN: [id number]  HPI  Aaron Ruiz is a 56 yr old male who presents today for follow up.    HTN- he continues amlodipine and Hyzaar.  Hyperglycemia- He has cut back on carbs, eliminated salt.  He has lost 10 pounds.   Has some post-nasal drip, hoarseness, using nasal spray which is helping. Using mucinex- 3-4x a day.  Mild cold like symptoms.    Review of Systems See HPI  Past Medical History  Diagnosis Date  . Anemia   . Asthma   . GERD (gastroesophageal reflux disease)   . Hyperlipidemia   . Hypertension   . Diverticulosis   . Colon polyps   . Hyperglycemia 05/01/2011  . Borderline diabetes 05/03/2011    History   Social History  . Marital Status: Married    Spouse Name: N/A    Number of Children: N/A  . Years of Education: N/A   Occupational History  . Not on file.   Social History Main Topics  . Smoking status: Former Games developer  . Smokeless tobacco: Never Used  . Alcohol Use: No  . Drug Use: Not on file  . Sexually Active: Not on file   Other Topics Concern  . 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 Inguinal hernia repair 1995  . Cystectomy 05/2004    removed fromback    Family History  Problem Relation Age of Onset  . Coronary artery disease Father     CABG X5  . Diabetes Father   . Hypertension    . Kidney disease    . Colon cancer Maternal Aunt     Allergies  Allergen Reactions  . Sulfonamide Derivatives     REACTION: Hallucination    Current Outpatient Prescriptions on File Prior to Visit  Medication Sig Dispense Refill  . albuterol (PROAIR HFA) 108 (90 BASE) MCG/ACT inhaler  Inhale 2 puffs into the lungs every 6 (six) hours as needed.  1 Inhaler  2  . amLODipine (NORVASC) 10 MG tablet Take 1 tablet (10 mg total) by mouth daily.  30 tablet  2  . aspirin 81 MG tablet Take 81 mg by mouth daily.        . cetirizine (ZYRTEC) 10 MG tablet Take 10 mg by mouth daily.        . famotidine (PEPCID) 20 MG tablet Take 20 mg by mouth 2 (two) times daily.       . pravastatin (PRAVACHOL) 40 MG tablet TAKE 1 TABLET BY MOUTH EVERY EVENING  30 tablet  2  . Testosterone (ANDROGEL PUMP TD) Place onto the skin. Apply topically to each shoulder once a day.         BP 146/84  Pulse 84  Temp(Src) 98 F (36.7 C) (Oral)  Resp 16  Wt 210 lb 1.3 oz (95.292 kg)       Objective:   Physical Exam  Constitutional: He appears well-developed and well-nourished.  Cardiovascular: Normal rate and regular rhythm.   No murmur heard. Pulmonary/Chest: Effort normal and breath sounds normal. No respiratory distress. He has no wheezes. He  has no rales. He exhibits no tenderness.  Musculoskeletal: He exhibits no edema.  Skin: Skin is warm and dry.  Psychiatric: He has a normal mood and affect. His behavior is normal. Judgment and thought content normal.          Assessment & Plan:   BP Readings from Last 3 Encounters:  07/23/11 146/84  05/15/11 146/80  04/30/11 134/80

## 2011-07-23 NOTE — Patient Instructions (Signed)
Please follow up in 1 month.  

## 2011-08-08 ENCOUNTER — Other Ambulatory Visit: Payer: Self-pay | Admitting: Family

## 2011-08-13 ENCOUNTER — Ambulatory Visit: Payer: Managed Care, Other (non HMO) | Admitting: Family

## 2011-08-27 ENCOUNTER — Ambulatory Visit (INDEPENDENT_AMBULATORY_CARE_PROVIDER_SITE_OTHER): Payer: Managed Care, Other (non HMO) | Admitting: Family

## 2011-08-27 ENCOUNTER — Encounter: Payer: Self-pay | Admitting: Family

## 2011-08-27 VITALS — BP 144/80 | HR 78 | Temp 98.0°F | Resp 16 | Ht 67.0 in | Wt 210.0 lb

## 2011-08-27 DIAGNOSIS — R7309 Other abnormal glucose: Secondary | ICD-10-CM

## 2011-08-27 DIAGNOSIS — E119 Type 2 diabetes mellitus without complications: Secondary | ICD-10-CM

## 2011-08-27 DIAGNOSIS — R7303 Prediabetes: Secondary | ICD-10-CM

## 2011-08-27 DIAGNOSIS — I10 Essential (primary) hypertension: Secondary | ICD-10-CM

## 2011-08-27 LAB — BASIC METABOLIC PANEL
CO2: 24 mEq/L (ref 19–32)
Calcium: 9.2 mg/dL (ref 8.4–10.5)
Creat: 0.79 mg/dL (ref 0.50–1.35)
Sodium: 140 mEq/L (ref 135–145)

## 2011-08-27 LAB — HEMOGLOBIN A1C: Hgb A1c MFr Bld: 5.8 % — ABNORMAL HIGH (ref <5.7)

## 2011-08-27 MED ORDER — METOPROLOL SUCCINATE ER 25 MG PO TB24: 25.0000 mg | ORAL_TABLET | Freq: Every day | ORAL | Status: DC

## 2011-08-27 NOTE — Patient Instructions (Signed)
Please follow up in 1 month.  

## 2011-08-27 NOTE — Progress Notes (Signed)
Subjective:    Patient ID: Aaron Ruiz, male    DOB: Dec 21, 1955, 56 y.o.   MRN: [id number]  HPI  [name] is a 57 yr old male who presents today for follow up.  1) HTN- last visit losartan-HCTZ was increased from 50/12.5 to 100/12.5.  He continues amlodipine. He reports that he has cut down on salt.  Denies problems with medication. Denies CP, SOB or lower extremity edema.   2) Borderline DM- He reports that he has had to tighten his belt- because it has become loose. Not walking due to tear in right knee.  He has not lost any further weight.    Review of Systems See HPI  Past Medical History  Diagnosis Date  . Anemia   . Asthma   . GERD (gastroesophageal reflux disease)   . Hyperlipidemia   . Hypertension   . Diverticulosis   . Colon polyps   . Hyperglycemia 05/01/2011  . Borderline diabetes 05/03/2011    History   Social History  . Marital Status: Married    Spouse Name: N/A    Number of Children: N/A  . Years of Education: N/A   Occupational History  . Not on file.   Social History Main Topics  . Smoking status: Former Games developer  . Smokeless tobacco: Never Used  . Alcohol Use: No  . Drug Use: Not on file  . Sexually Active: Not on file   Other Topics Concern  . 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 Inguinal hernia repair 1995  . Cystectomy 05/2004    removed fromback    Family History  Problem Relation Age of Onset  . Coronary artery disease Father     CABG X5  . Diabetes Father   . Hypertension    . Kidney disease    . Colon cancer Maternal Aunt     Allergies  Allergen Reactions  . Sulfonamide Derivatives     REACTION: Hallucination    Current Outpatient  Prescriptions on File Prior to Visit  Medication Sig Dispense Refill  . acetaminophen (TYLENOL) 500 MG tablet Take 500 mg by mouth every 6 (six) hours as needed.      [name] albuterol (PROAIR HFA) 108 (90 BASE) MCG/ACT inhaler Inhale 2 puffs into the lungs every 6 (six) hours as needed.  1 Inhaler  2  . amLODipine (NORVASC) 10 MG tablet TAKE 1 TABLET BY MOUTH EVERY DAY  30 tablet  1  . aspirin 81 MG tablet Take 81 mg by mouth daily.        . cetirizine (ZYRTEC) 10 MG tablet Take 10 mg by mouth daily.        . famotidine (PEPCID) 20 MG tablet Take 20 mg by mouth 2 (two) times daily.       [name] losartan-hydrochlorothiazide (HYZAAR) 100-12.5 MG per tablet Take 1 tablet by mouth daily.  30 tablet  2  . pravastatin (PRAVACHOL) 40 MG tablet TAKE 1 TABLET BY MOUTH EVERY EVENING  30 tablet  2  . Testosterone (ANDROGEL PUMP TD) Place onto the skin. Apply topically to each shoulder once a day.         BP 144/80  Pulse  78  Temp(Src) 98 F (36.7 C) (Oral)  Resp 16  Ht 5\' 7"  (1.702 m)  Wt 210 lb (95.255 kg)  BMI 32.89 kg/m2  SpO2 97%       Objective:   Physical Exam  Constitutional: He appears well-developed and well-nourished. No distress.  Cardiovascular: Normal rate and regular rhythm.   No murmur heard. Pulmonary/Chest: Effort normal and breath sounds normal. No respiratory distress. He has no wheezes. He has no rales. He exhibits no tenderness.  Musculoskeletal: He exhibits no edema.  Psychiatric: He has a normal mood and affect. His behavior is normal. Judgment and thought content normal.          Assessment & Plan:

## 2011-08-27 NOTE — Assessment & Plan Note (Signed)
BP Readings from Last 3 Encounters:  08/27/11 144/80  07/23/11 146/84  05/15/11 146/80   No sig improvement in his BP despite increase in losartan-hct dose.  Will plan to continue current meds and add toprol xl.  Obtain BMET today. Follow up in 1 months.

## 2011-08-27 NOTE — Assessment & Plan Note (Signed)
Will obtain A1C, I encouraged him to work on diet/exercise and weight loss.

## 2011-08-28 ENCOUNTER — Encounter: Payer: Self-pay | Admitting: Family

## 2011-09-24 ENCOUNTER — Ambulatory Visit (INDEPENDENT_AMBULATORY_CARE_PROVIDER_SITE_OTHER): Payer: Managed Care, Other (non HMO) | Admitting: Family

## 2011-09-24 ENCOUNTER — Encounter: Payer: Self-pay | Admitting: Family

## 2011-09-24 VITALS — BP 134/74 | HR 71 | Temp 98.4°F | Resp 16 | Ht 67.0 in | Wt 207.0 lb

## 2011-09-24 DIAGNOSIS — M1990 Unspecified osteoarthritis, unspecified site: Secondary | ICD-10-CM | POA: Insufficient documentation

## 2011-09-24 DIAGNOSIS — I10 Essential (primary) hypertension: Secondary | ICD-10-CM

## 2011-09-24 DIAGNOSIS — J45909 Unspecified asthma, uncomplicated: Secondary | ICD-10-CM

## 2011-09-24 HISTORY — DX: Unspecified osteoarthritis, unspecified site: M19.90

## 2011-09-24 MED ORDER — AMLODIPINE BESYLATE 10 MG PO TABS: 10.0000 mg | ORAL_TABLET | Freq: Every day | ORAL | Status: DC

## 2011-09-24 MED ORDER — PRAVASTATIN SODIUM 40 MG PO TABS: 40.0000 mg | ORAL_TABLET | Freq: Every day | ORAL | Status: DC

## 2011-09-24 MED ORDER — LOSARTAN POTASSIUM-HCTZ 100-12.5 MG PO TABS: 1.0000 | ORAL_TABLET | Freq: Every day | ORAL | Status: DC

## 2011-09-24 MED ORDER — METOPROLOL SUCCINATE ER 25 MG PO TB24: 25.0000 mg | ORAL_TABLET | Freq: Every day | ORAL | Status: DC

## 2011-09-24 NOTE — Assessment & Plan Note (Signed)
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  Stable on prn tylenol. Monitor.

## 2011-09-24 NOTE — Progress Notes (Signed)
Subjective:    Patient ID: Aaron Ruiz, male    DOB: September 20, 1955, 56 y.o.   MRN: [id number]  HPI  Aaron Ruiz is a 56 yr old male who presents today for follow up of his blood pressure.   HTN-  Last visit Toprol xl was added to his regimen.  He reports that he has been tolerating this without difficulty.  He continues hyzaar and amlodipine.  Denies CP, shortness of breath.  Asthma- not bothering  him lately.  He uses proair on a PRN basis.  Knee pain- reports that his knees have been bothering him lately due to prolonged standing.  He has been using tylenol PRN which has been helping.   Review of Systems    see HPI  Past Medical History  Diagnosis Date  . Anemia   . Asthma   . GERD (gastroesophageal reflux disease)   . Hyperlipidemia   . Hypertension   . Diverticulosis   . Colon polyps   . Hyperglycemia 05/01/2011  . Borderline diabetes 05/03/2011    History   Social History  . Marital Status: Married    Spouse Name: N/A    Number of Children: N/A  . Years of Education: N/A   Occupational History  . Not on file.   Social History Main Topics  . Smoking status: Former Games developer  . Smokeless tobacco: Never Used  . Alcohol Use: No  . Drug Use: Not on file  . Sexually Active: Not on file   Other Topics Concern  . 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 Inguinal hernia repair 1995  . Cystectomy 05/2004    removed fromback    Family History  Problem Relation Age of Onset  . Coronary artery disease Father     CABG X5  . Diabetes Father   . Hypertension    . Kidney disease    . Colon cancer Maternal Aunt     Allergies  Allergen Reactions  . Sulfonamide Derivatives    REACTION: Hallucination    Current Outpatient Prescriptions on File Prior to Visit  Medication Sig Dispense Refill  . acetaminophen (TYLENOL) 500 MG tablet Take 500 mg by mouth every 6 (six) hours as needed.      [name] albuterol (PROAIR HFA) 108 (90 BASE) MCG/ACT inhaler Inhale 2 puffs into the lungs every 6 (six) hours as needed.  1 Inhaler  2  . aspirin 81 MG tablet Take 81 mg by mouth daily.        . cetirizine (ZYRTEC) 10 MG tablet Take 10 mg by mouth daily.        . famotidine (PEPCID) 20 MG tablet Take 20 mg by mouth 2 (two) times daily.       . Testosterone (ANDROGEL PUMP TD) Place onto the skin. Apply topically to each shoulder once a day.         BP 134/74  Pulse 71  Temp(Src) 98.4 F (36.9 C) (Oral)  Resp 16  Ht 5\' 7"  (1.702 m)  Wt 207 lb (93.895 kg)  BMI 32.42 kg/m2  SpO2 96%    Objective:   Physical Exam  Constitutional: He appears well-developed and well-nourished. No distress.  Cardiovascular: Normal rate and regular rhythm.   No murmur heard. Pulmonary/Chest: Effort normal and breath sounds normal. No respiratory distress. He has no wheezes. He has no rales. He exhibits no tenderness.  Psychiatric: He has a normal mood and affect. His behavior is normal. Judgment and thought content normal.          Assessment & Plan:

## 2011-09-24 NOTE — Assessment & Plan Note (Signed)
  Stable, using proair prn.

## 2011-09-24 NOTE — Patient Instructions (Signed)
Please follow up this Summer for a fasting physical.

## 2011-09-24 NOTE — Assessment & Plan Note (Signed)
BP is now at goal.  Continue current meds.  BP Readings from Last 3 Encounters:  09/24/11 134/74  08/27/11 144/80  07/23/11 146/84

## 2011-12-10 ENCOUNTER — Telehealth: Payer: Self-pay | Admitting: Family

## 2011-12-10 DIAGNOSIS — R7309 Other abnormal glucose: Secondary | ICD-10-CM

## 2011-12-10 DIAGNOSIS — Z0000 Encounter for general adult medical examination without abnormal findings: Secondary | ICD-10-CM

## 2011-12-11 NOTE — Telephone Encounter (Signed)
Please advise what labs and dx codes to order for pt?

## 2011-12-11 NOTE — Telephone Encounter (Signed)
Lab order placed and copy sent to the lab.

## 2011-12-11 NOTE — Telephone Encounter (Signed)
BMET, LFT, CBC, FLP, TSH, UA reflex micro (V70.0).  A1C (hyperglycemia) Thanks

## 2011-12-12 ENCOUNTER — Other Ambulatory Visit: Payer: Self-pay | Admitting: *Deleted

## 2011-12-12 DIAGNOSIS — R7309 Other abnormal glucose: Secondary | ICD-10-CM

## 2011-12-12 DIAGNOSIS — Z0000 Encounter for general adult medical examination without abnormal findings: Secondary | ICD-10-CM

## 2011-12-12 DIAGNOSIS — E119 Type 2 diabetes mellitus without complications: Secondary | ICD-10-CM

## 2011-12-12 LAB — CBC WITH DIFFERENTIAL/PLATELET
Eosinophils Relative: 3 % (ref 0–5)
Hemoglobin: 15.4 g/dL (ref 13.0–17.0)
Lymphocytes Relative: 34 % (ref 12–46)
Lymphs Abs: 1.5 10*3/uL (ref 0.7–4.0)
MCV: 83 fL (ref 78.0–100.0)
Neutrophils Relative %: 55 % (ref 43–77)
Platelets: 142 10*3/uL — ABNORMAL LOW (ref 150–400)
RBC: 5.52 MIL/uL (ref 4.22–5.81)
WBC: 4.4 10*3/uL (ref 4.0–10.5)

## 2011-12-12 LAB — LIPID PANEL: Total CHOL/HDL Ratio: 4.4 Ratio

## 2011-12-12 LAB — BASIC METABOLIC PANEL
BUN: 16 mg/dL (ref 6–23)
CO2: 24 mEq/L (ref 19–32)
Chloride: 106 mEq/L (ref 96–112)
Creat: 0.83 mg/dL (ref 0.50–1.35)
Glucose, Bld: 95 mg/dL (ref 70–99)

## 2011-12-12 LAB — HEPATIC FUNCTION PANEL
ALT: 16 U/L (ref 0–53)
AST: 15 U/L (ref 0–37)
Albumin: 4.5 g/dL (ref 3.5–5.2)
Alkaline Phosphatase: 52 U/L (ref 39–117)
Total Bilirubin: 0.5 mg/dL (ref 0.3–1.2)

## 2011-12-12 LAB — TSH: TSH: 2.002 u[IU]/mL (ref 0.350–4.500)

## 2011-12-13 LAB — URINALYSIS, ROUTINE W REFLEX MICROSCOPIC
Nitrite: NEGATIVE
Specific Gravity, Urine: 1.01 (ref 1.005–1.030)
Urobilinogen, UA: 0.2 mg/dL (ref 0.0–1.0)

## 2011-12-16 ENCOUNTER — Ambulatory Visit (INDEPENDENT_AMBULATORY_CARE_PROVIDER_SITE_OTHER): Payer: Managed Care, Other (non HMO) | Admitting: Family

## 2011-12-16 ENCOUNTER — Encounter: Payer: Self-pay | Admitting: Family

## 2011-12-16 VITALS — BP 140/76 | HR 69 | Temp 98.1°F | Resp 16 | Ht 67.0 in | Wt 207.0 lb

## 2011-12-16 DIAGNOSIS — Z0000 Encounter for general adult medical examination without abnormal findings: Secondary | ICD-10-CM | POA: Insufficient documentation

## 2011-12-16 DIAGNOSIS — I10 Essential (primary) hypertension: Secondary | ICD-10-CM

## 2011-12-16 DIAGNOSIS — Z5181 Encounter for therapeutic drug level monitoring: Secondary | ICD-10-CM | POA: Insufficient documentation

## 2011-12-16 HISTORY — DX: Encounter for general adult medical examination without abnormal findings: Z00.00

## 2011-12-16 NOTE — Assessment & Plan Note (Addendum)
56 yr old male presents today for cpx.  Reviewed fasting labs with pt.  PSA performed by urology and per pt was normal. Pt declines pneumovax.  Tetanus is up to date.  Colo up to date.

## 2011-12-16 NOTE — Patient Instructions (Addendum)
Please follow up in 4 months

## 2011-12-16 NOTE — Progress Notes (Signed)
Subjective:    Patient ID: Aaron Ruiz, male    DOB: 1955-10-15, 56 y.o.   MRN: [id number]  HPI  Preventative- Pt reports that he is not currently exercising, he continues a low carb diet.  Urology checked PSA and completed DRE per patient.    Review of Systems  Constitutional: Negative for unexpected weight change.  HENT: Negative for congestion.        Cannot hear as well out of left ear as right.  Declines referral to audiology  Respiratory: Negative for cough and shortness of breath.   Cardiovascular: Negative for chest pain and leg swelling.  Gastrointestinal: Negative for nausea, vomiting and diarrhea.  Genitourinary: Negative for dysuria and frequency.  Musculoskeletal:       Occasional r knee pain due to torn meniscus.  Skin:       Denies concerning moles.  Neurological: Negative for headaches.  Hematological: Negative for adenopathy.  Psychiatric/Behavioral:       Denis depression/anxiety   Past Medical History  Diagnosis Date  . Anemia   . Asthma   . GERD (gastroesophageal reflux disease)   . Hyperlipidemia   . Hypertension   . Diverticulosis   . Colon polyps   . Hyperglycemia 05/01/2011  . Borderline diabetes 05/03/2011    History   Social History  . Marital Status: Married    Spouse Name: N/A    Number of Children: N/A  . Years of Education: N/A   Occupational History  . Not on file.   Social History Main Topics  . Smoking status: Former Games developer  . Smokeless tobacco: Never Used  . Alcohol Use: No  . Drug Use: Not on file  . Sexually Active: Not on file   Other Topics Concern  . 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 Inguinal hernia repair 1995  . Cystectomy  05/2004    removed fromback    Family History  Problem Relation Age of Onset  . Coronary artery disease Father     CABG X5  . Diabetes Father   . Hypertension    . Kidney disease    . Colon cancer Maternal Aunt     Allergies  Allergen Reactions  . Sulfonamide Derivatives     REACTION: Hallucination    Current Outpatient Prescriptions on File Prior to Visit  Medication Sig Dispense Refill  . acetaminophen (TYLENOL) 500 MG tablet Take 500 mg by mouth every 6 (six) hours as needed.      [name] albuterol (PROAIR HFA) 108 (90 BASE) MCG/ACT inhaler Inhale 2 puffs into the lungs every 6 (six) hours as needed.  1 Inhaler  2  . amLODipine (NORVASC) 10 MG tablet Take 1 tablet (10 mg total) by mouth daily.  30 tablet  5  . aspirin 81 MG tablet Take 81 mg by mouth daily.        . cetirizine (ZYRTEC) 10 MG tablet Take 10 mg by mouth daily.        . famotidine (PEPCID) 20 MG tablet Take 20 mg by mouth 2 (two) times daily.       [name] losartan-hydrochlorothiazide (HYZAAR) 100-12.5 MG per tablet Take 1 tablet by mouth daily.  30  tablet  5  . metoprolol succinate (TOPROL-XL) 25 MG 24 hr tablet Take 1 tablet (25 mg total) by mouth daily.  30 tablet  5  . pravastatin (PRAVACHOL) 40 MG tablet Take 1 tablet (40 mg total) by mouth daily.  30 tablet  5  . Testosterone (ANDROGEL PUMP TD) Place onto the skin. Apply topically to each shoulder once a day.         BP 140/76  Pulse 69  Temp 98.1 F (36.7 C) (Oral)  Resp 16  Ht 5\' 7"  (1.702 m)  Wt 207 lb (93.895 kg)  BMI 32.42 kg/m2  SpO2 97%       Objective:   Physical Exam  Physical Exam  Constitutional: He is oriented to person, place, and time. He appears well-developed and well-nourished. No distress.  HENT:  Head: Normocephalic and atraumatic.  Right Ear: Tympanic membrane and ear canal normal.  Left Ear: Tympanic membrane and ear canal normal.  Mouth/Throat: Oropharynx is clear and moist.  Eyes: Pupils are equal, round, and reactive to  light. No scleral icterus.  Neck: Normal range of motion. No thyromegaly present.  Cardiovascular: Normal rate and regular rhythm.   No murmur heard. Pulmonary/Chest: Effort normal and breath sounds normal. No respiratory distress. He has no wheezes. He has no rales. He exhibits no tenderness.  Abdominal: Soft. Bowel sounds are normal. He exhibits no distension and no mass. There is no tenderness. There is no rebound and no guarding.  Musculoskeletal: He exhibits no edema.  Lymphadenopathy:    He has no cervical adenopathy.  Neurological: He is alert and oriented to person, place, and time. He has normal reflexes. He exhibits normal muscle tone. Coordination normal.  Skin: Skin is warm and dry.  Psychiatric: He has a normal mood and affect. His behavior is normal. Judgment and thought content normal.  Gu: deferred to urology       Assessment & Plan:         Assessment & Plan:

## 2012-04-12 ENCOUNTER — Other Ambulatory Visit: Payer: Self-pay | Admitting: Family

## 2012-04-13 NOTE — Telephone Encounter (Signed)
Pt is due for follow up this month. Please call patient to arrange appt. Pravastatin and amlodipine #30 no refills have been sent to pharmacy.

## 2012-04-13 NOTE — Telephone Encounter (Signed)
Informed patient that a 30 day supply with no refills of both medications have been sent in to the pharmacy and patient states that he will have to call back to schedule follow up appointment.

## 2012-04-28 ENCOUNTER — Encounter: Payer: Self-pay | Admitting: Family

## 2012-04-28 ENCOUNTER — Ambulatory Visit (INDEPENDENT_AMBULATORY_CARE_PROVIDER_SITE_OTHER): Payer: Managed Care, Other (non HMO) | Admitting: Family

## 2012-04-28 VITALS — BP 142/80 | HR 75 | Temp 98.8°F | Resp 16 | Ht 67.0 in | Wt 210.0 lb

## 2012-04-28 DIAGNOSIS — I10 Essential (primary) hypertension: Secondary | ICD-10-CM

## 2012-04-28 DIAGNOSIS — E785 Hyperlipidemia, unspecified: Secondary | ICD-10-CM

## 2012-04-28 DIAGNOSIS — R7303 Prediabetes: Secondary | ICD-10-CM

## 2012-04-28 DIAGNOSIS — R7309 Other abnormal glucose: Secondary | ICD-10-CM

## 2012-04-28 DIAGNOSIS — Z23 Encounter for immunization: Secondary | ICD-10-CM

## 2012-04-28 LAB — BASIC METABOLIC PANEL
Calcium: 9.4 mg/dL (ref 8.4–10.5)
Potassium: 4.7 mEq/L (ref 3.5–5.3)
Sodium: 139 mEq/L (ref 135–145)

## 2012-04-28 MED ORDER — LOSARTAN POTASSIUM-HCTZ 100-12.5 MG PO TABS: 1.0000 | ORAL_TABLET | Freq: Every day | ORAL | Status: DC

## 2012-04-28 MED ORDER — AMLODIPINE BESYLATE 10 MG PO TABS: 10.0000 mg | ORAL_TABLET | Freq: Every day | ORAL | Status: DC

## 2012-04-28 MED ORDER — METOPROLOL SUCCINATE ER 25 MG PO TB24: 25.0000 mg | ORAL_TABLET | Freq: Every day | ORAL | Status: DC

## 2012-04-28 MED ORDER — PRAVASTATIN SODIUM 40 MG PO TABS: 40.0000 mg | ORAL_TABLET | Freq: Every day | ORAL | Status: DC

## 2012-04-28 NOTE — Patient Instructions (Addendum)
Please complete your blood work prior to leaving. Please schedule a follow up appointment in 3 months.  

## 2012-04-28 NOTE — Progress Notes (Signed)
Subjective:    Patient ID: Aaron Ruiz, male    DOB: 04/11/1956, 56 y.o.   MRN: [id number]  HPI  Aaron Ruiz is a 56 yr old male who presents today for follow up.  HTN- the pt is continued on amlodipine and losartan-hctz.  Hyperlipidemia- He continues pravastatin without myalgia.  LDL was at goal in June at 31.  Hyperglycemia- last A1C was mildly elevated at 5.7.  He is requesting a flu shot today.   Review of Systems See HPI Past Medical History  Diagnosis Date  . Anemia   . Asthma   . GERD (gastroesophageal reflux disease)   . Hyperlipidemia   . Hypertension   . Diverticulosis   . Colon polyps   . Hyperglycemia 05/01/2011  . Borderline diabetes 05/03/2011    History   Social History  . Marital Status: Married    Spouse Name: N/A    Number of Children: N/A  . Years of Education: N/A   Occupational History  . Not on file.   Social History Main Topics  . Smoking status: Former Games developer  . Smokeless tobacco: Never Used  . Alcohol Use: No  . Drug Use: Not on file  . Sexually Active: Not on file   Other Topics Concern  . 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 Inguinal hernia repair 1995  . Cystectomy 05/2004    removed fromback    Family History  Problem Relation Age of Onset  . Coronary artery disease Father     CABG X5  . Diabetes Father   . Hypertension    . Kidney disease    . Colon cancer Maternal Aunt     Allergies  Allergen Reactions  . Sulfonamide Derivatives     REACTION: Hallucination    Current Outpatient Prescriptions on File Prior to Visit  Medication Sig Dispense Refill  . acetaminophen (TYLENOL) 500 MG tablet Take 500 mg by mouth every 6 (six) hours  as needed.      [name] albuterol (PROAIR HFA) 108 (90 BASE) MCG/ACT inhaler Inhale 2 puffs into the lungs every 6 (six) hours as needed.  1 Inhaler  2  . amLODipine (NORVASC) 10 MG tablet Take 1 tablet (10 mg total) by mouth daily.  30 tablet  3  . aspirin 81 MG tablet Take 81 mg by mouth daily.        . cetirizine (ZYRTEC) 10 MG tablet Take 10 mg by mouth daily.        . famotidine (PEPCID) 20 MG tablet Take 20 mg by mouth 2 (two) times daily.       [name] losartan-hydrochlorothiazide (HYZAAR) 100-12.5 MG per tablet Take 1 tablet by mouth daily.  30 tablet  3  . metoprolol succinate (TOPROL-XL) 25 MG 24 hr tablet Take 1 tablet (25 mg total) by mouth daily.  30 tablet  3  . pravastatin (PRAVACHOL) 40 MG tablet Take 1 tablet (40 mg total) by mouth daily.  30 tablet  3  . Testosterone (ANDROGEL PUMP TD) Place onto the skin. Apply topically to each shoulder once a day.         BP 142/80  Pulse 75  Temp 98.8 F (  37.1 C) (Oral)  Resp 16  Ht 5\' 7"  (1.702 m)  Wt 210 lb (95.255 kg)  BMI 32.89 kg/m2  SpO2 99%       Objective:   Physical Exam  Constitutional: He appears well-developed and well-nourished. No distress.  Cardiovascular: Normal rate and regular rhythm.   No murmur heard. Pulmonary/Chest: Effort normal and breath sounds normal. No respiratory distress. He has no wheezes. He has no rales. He exhibits no tenderness.  Musculoskeletal: He exhibits no edema.  Psychiatric: He has a normal mood and affect. His behavior is normal. Judgment and thought content normal.          Assessment & Plan:   BP Readings from Last 3 Encounters:  04/28/12 142/80  12/16/11 140/76  09/24/11 134/74

## 2012-04-29 ENCOUNTER — Encounter: Payer: Self-pay | Admitting: Family

## 2012-05-08 NOTE — Assessment & Plan Note (Signed)
Fair BP control on current meds. Continue same.  

## 2012-05-08 NOTE — Assessment & Plan Note (Signed)
a1C has been borderline- around 5.7.  Monitor.

## 2012-05-08 NOTE — Assessment & Plan Note (Signed)
On statin- LDL at goal. Continue pravachol.

## 2012-05-26 ENCOUNTER — Ambulatory Visit (INDEPENDENT_AMBULATORY_CARE_PROVIDER_SITE_OTHER): Payer: Managed Care, Other (non HMO) | Admitting: Family

## 2012-05-26 ENCOUNTER — Encounter: Payer: Self-pay | Admitting: Family

## 2012-05-26 VITALS — BP 138/80 | HR 86 | Temp 99.5°F | Resp 18 | Wt 215.1 lb

## 2012-05-26 DIAGNOSIS — N390 Urinary tract infection, site not specified: Secondary | ICD-10-CM

## 2012-05-26 DIAGNOSIS — R3129 Other microscopic hematuria: Secondary | ICD-10-CM

## 2012-05-26 DIAGNOSIS — R3915 Urgency of urination: Secondary | ICD-10-CM

## 2012-05-26 LAB — POCT URINALYSIS DIPSTICK
Glucose, UA: NEGATIVE
Ketones, UA: NEGATIVE
Spec Grav, UA: 1.02
Urobilinogen, UA: 0.2

## 2012-05-26 MED ORDER — CIPROFLOXACIN HCL 500 MG PO TABS: 500.0000 mg | ORAL_TABLET | Freq: Two times a day (BID) | ORAL | Status: DC

## 2012-05-26 NOTE — Patient Instructions (Addendum)
Urinary Tract Infection Urinary tract infections (UTIs) can develop anywhere along your urinary tract. Your urinary tract is your body's drainage system for removing wastes and extra water. Your urinary tract includes two kidneys, two ureters, a bladder, and a urethra. Your kidneys are a pair of bean-shaped organs. Each kidney is about the size of your fist. They are located below your ribs, one on each side of your spine. CAUSES Infections are caused by microbes, which are microscopic organisms, including fungi, viruses, and bacteria. These organisms are so small that they can only be seen through a microscope. Bacteria are the microbes that most commonly cause UTIs. SYMPTOMS  Symptoms of UTIs may vary by age and gender of the patient and by the location of the infection. Symptoms in young women typically include a frequent and intense urge to urinate and a painful, burning feeling in the bladder or urethra during urination. Older women and men are more likely to be tired, shaky, and weak and have muscle aches and abdominal pain. A fever may mean the infection is in your kidneys. Other symptoms of a kidney infection include pain in your back or sides below the ribs, nausea, and vomiting. DIAGNOSIS To diagnose a UTI, your caregiver will ask you about your symptoms. Your caregiver also will ask to provide a urine sample. The urine sample will be tested for bacteria and white blood cells. White blood cells are made by your body to help fight infection. TREATMENT  Typically, UTIs can be treated with medication. Because most UTIs are caused by a bacterial infection, they usually can be treated with the use of antibiotics. The choice of antibiotic and length of treatment depend on your symptoms and the type of bacteria causing your infection. HOME CARE INSTRUCTIONS  If you were prescribed antibiotics, take them exactly as your caregiver instructs you. Finish the medication even if you feel better after you  have only taken some of the medication.  Drink enough water and fluids to keep your urine clear or pale yellow.  Avoid caffeine, tea, and carbonated beverages. They tend to irritate your bladder.  Empty your bladder often. Avoid holding urine for long periods of time.  Empty your bladder before and after sexual intercourse.  After a bowel movement, women should cleanse from front to back. Use each tissue only once. SEEK MEDICAL CARE IF:   You have back pain.  You develop a fever.  Your symptoms do not begin to resolve within 3 days. SEEK IMMEDIATE MEDICAL CARE IF:   You have severe back pain or lower abdominal pain.  You develop chills.  You have nausea or vomiting.  You have continued burning or discomfort with urination. MAKE SURE YOU:   Understand these instructions.  Will watch your condition.  Will get help right away if you are not doing well or get worse. Document Released: 03/27/2005 Document Revised: 12/17/2011 Document Reviewed: 07/26/2011 Exit[employer] Patient Information 2013 [employer].  

## 2012-05-26 NOTE — Assessment & Plan Note (Signed)
Symptoms and UA consistent with UTI.  Will plan to treat with cipro.  I have asked him to let us know if he has any urinary difficulties/frequency or nocturia after completing abx.  He should go to ED if unable to void.

## 2012-05-26 NOTE — Progress Notes (Signed)
Subjective:    Patient ID: Aaron Ruiz, male    DOB: [birth year], 56 y.o.   MRN: [id number]  HPI  Aaron Ruiz is a 56 yr old male who presents today with chief complaint of dysuria.  He reports that symptoms started about 1 week ago and are associated with urinary frequency. He denies associated low back pain or hematuria.  He reports good urinary stream.  Denies nocturia.  Review of Systems See HPI  Past Medical History  Diagnosis Date  . Anemia   . Asthma   . GERD (gastroesophageal reflux disease)   . Hyperlipidemia   . Hypertension   . Diverticulosis   . Colon polyps   . Hyperglycemia 05/01/2011  . Borderline diabetes 05/03/2011    History   Social History  . Marital Status: Married    Spouse Name: N/A    Number of Children: N/A  . Years of Education: N/A   Occupational History  . Not on file.   Social History Main Topics  . Smoking status: Former Games developer  . Smokeless tobacco: Never Used  . Alcohol Use: No  . Drug Use: Not on file  . Sexually Active: Not on file   Other Topics Concern  . 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 Inguinal hernia repair 1995  . Cystectomy 05/2004    removed fromback    Family History  Problem Relation Age of Onset  . Coronary artery disease Father     CABG X5  . Diabetes Father   . Hypertension    . Kidney disease    . Colon cancer Maternal Aunt     Allergies  Allergen Reactions  . Sulfonamide Derivatives     REACTION: Hallucination    Current Outpatient Prescriptions on File Prior to Visit  Medication Sig Dispense Refill  . acetaminophen (TYLENOL) 500 MG tablet Take 500 mg by mouth every 6 (six) hours as needed.      [name] albuterol  (PROAIR HFA) 108 (90 BASE) MCG/ACT inhaler Inhale 2 puffs into the lungs every 6 (six) hours as needed.  1 Inhaler  2  . amLODipine (NORVASC) 10 MG tablet Take 1 tablet (10 mg total) by mouth daily.  30 tablet  3  . aspirin 81 MG tablet Take 81 mg by mouth daily.        . cetirizine (ZYRTEC) 10 MG tablet Take 10 mg by mouth daily.        [name] losartan-hydrochlorothiazide (HYZAAR) 100-12.5 MG per tablet Take 1 tablet by mouth daily.  30 tablet  3  . metoprolol succinate (TOPROL-XL) 25 MG 24 hr tablet Take 1 tablet (25 mg total) by mouth daily.  30 tablet  3  . pravastatin (PRAVACHOL) 40 MG tablet Take 1 tablet (40 mg total) by mouth daily.  30 tablet  3  . Ranitidine HCl (ZANTAC PO) Take 1 tablet by mouth 2 (two) times daily.      . Testosterone (ANDROGEL PUMP TD) Place onto the skin. Apply topically to each shoulder once a day.       . famotidine (PEPCID) 20 MG tablet Take 20 mg by mouth 2 (two) times daily.  BP 138/80  Pulse 86  Temp 99.5 F (37.5 C) (Oral)  Resp 18  Wt 215 lb 1.9 oz (97.578 kg)  SpO2 96%       Objective:   Physical Exam  Constitutional: He appears well-developed and well-nourished. No distress.  HENT:  Head: Normocephalic and atraumatic.  Cardiovascular: Normal rate and regular rhythm.   No murmur heard. Pulmonary/Chest: Effort normal and breath sounds normal. No respiratory distress. He has no wheezes. He has no rales. He exhibits no tenderness.  Abdominal: Soft. Bowel sounds are normal. He exhibits no distension.  Genitourinary:       Neg CVAT bilaterally.  Musculoskeletal: He exhibits no edema.  Psychiatric: He has a normal mood and affect. His behavior is normal. Judgment and thought content normal.          Assessment & Plan:

## 2012-05-28 LAB — URINE CULTURE: Colony Count: >=100000

## 2012-06-19 ENCOUNTER — Telehealth: Payer: Self-pay | Admitting: Family

## 2012-06-19 NOTE — Telephone Encounter (Signed)
Patient Information:  Caller Name: Jeriko  Phone: [phone]  Patient: Aaron Ruiz, Aaron Ruiz  Gender: Male  DOB: 14-Feb-1956  Age: 56 Years  PCP: Sandford Craze (Adults only)  Office Follow Up:  Does the office need to follow up with this patient?: Yes  Instructions For The Office: Please call to advise if can be worked into office for appointment 06/19/12 or if needs to go to UC.   Symptoms  Reason For Call & Symptoms: Called regarding urine symptoms with  mild dysuria, urgency and frequency. Requesting antibiotic for holiday weekend.  Reviewed Health History In EMR: Yes  Reviewed Medications In EMR: Yes  Reviewed Allergies In EMR: Yes  Reviewed Surgeries / Procedures: Yes  Date of Onset of Symptoms: 06/18/2012  Treatments Tried: Drinking fluids, Tylenol  Treatments Tried Worked: No  Guideline(s) Used:  Urination Pain - Male  Disposition Per Guideline:   See Today in Office  Reason For Disposition Reached:   All other males with painful urination, or patient wants to be seen  Advice Given:  Fluids  : Drink extra fluids (Reason: to produce a dilute, nonirritating urine).  Call Back If:  You become worse.

## 2012-06-19 NOTE — Telephone Encounter (Signed)
Notified pt he should be seen in urgent care over the weekend and he voices understanding.

## 2012-07-28 ENCOUNTER — Encounter: Payer: Self-pay | Admitting: Family

## 2012-07-28 ENCOUNTER — Ambulatory Visit (INDEPENDENT_AMBULATORY_CARE_PROVIDER_SITE_OTHER): Payer: Managed Care, Other (non HMO) | Admitting: Family

## 2012-07-28 VITALS — BP 138/80 | HR 75 | Temp 98.3°F | Resp 16 | Ht 67.0 in | Wt 216.0 lb

## 2012-07-28 DIAGNOSIS — I10 Essential (primary) hypertension: Secondary | ICD-10-CM

## 2012-07-28 DIAGNOSIS — R7309 Other abnormal glucose: Secondary | ICD-10-CM

## 2012-07-28 DIAGNOSIS — E291 Testicular hypofunction: Secondary | ICD-10-CM

## 2012-07-28 DIAGNOSIS — E785 Hyperlipidemia, unspecified: Secondary | ICD-10-CM

## 2012-07-28 DIAGNOSIS — R739 Hyperglycemia, unspecified: Secondary | ICD-10-CM

## 2012-07-28 DIAGNOSIS — R7303 Prediabetes: Secondary | ICD-10-CM

## 2012-07-28 LAB — HEPATIC FUNCTION PANEL
Albumin: 4.9 g/dL (ref 3.5–5.2)
Alkaline Phosphatase: 46 U/L (ref 39–117)
Total Bilirubin: 0.6 mg/dL (ref 0.3–1.2)

## 2012-07-28 LAB — HEMOGLOBIN A1C
Hgb A1c MFr Bld: 6.3 % — ABNORMAL HIGH (ref <5.7)
Mean Plasma Glucose: 134 mg/dL — ABNORMAL HIGH (ref <117)

## 2012-07-28 MED ORDER — AMLODIPINE BESYLATE 10 MG PO TABS: 10.0000 mg | ORAL_TABLET | Freq: Every day | ORAL | Status: DC

## 2012-07-28 MED ORDER — PRAVASTATIN SODIUM 40 MG PO TABS: 40.0000 mg | ORAL_TABLET | Freq: Every day | ORAL | Status: DC

## 2012-07-28 MED ORDER — LOSARTAN POTASSIUM-HCTZ 100-12.5 MG PO TABS: 1.0000 | ORAL_TABLET | Freq: Every day | ORAL | Status: DC

## 2012-07-28 MED ORDER — METOPROLOL SUCCINATE ER 25 MG PO TB24: 25.0000 mg | ORAL_TABLET | Freq: Every day | ORAL | Status: DC

## 2012-07-28 NOTE — Patient Instructions (Addendum)
Please follow up in 6 months for your complete physical.  Complete your lab work prior to leaving.

## 2012-07-28 NOTE — Assessment & Plan Note (Signed)
Will plan to continue current meds.

## 2012-07-28 NOTE — Progress Notes (Signed)
Subjective:    Patient ID: Aaron Ruiz, male    DOB: January 08, 1956, 57 y.o.   MRN: [id number]  HPI  Mr. bahl is a 57 yr old male who presents today for follow up.  1) HTN- He is currently maintained on losartan hctz.  Denies CP, SOB, or swelling.  2) Hyperglycemia- last A1C performed in June 2013 was 5.7.    3) Testosterone deficiency- had been on androgel per Urology. He stopped this due to urinary frequency.  Never did feel more energy.      Review of Systems    see HPI  Past Medical History  Diagnosis Date  . Anemia   . Asthma   . GERD (gastroesophageal reflux disease)   . Hyperlipidemia   . Hypertension   . Diverticulosis   . Colon polyps   . Hyperglycemia 05/01/2011  . Borderline diabetes 05/03/2011    History   Social History  . Marital Status: Married    Spouse Name: N/A    Number of Children: N/A  . Years of Education: N/A   Occupational History  . Not on file.   Social History Main Topics  . Smoking status: Former Games developer  . Smokeless tobacco: Never Used  . Alcohol Use: No  . Drug Use: Not on file  . Sexually Active: Not on file   Other Topics Concern  . 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 Inguinal hernia repair 1995  . Cystectomy 05/2004    removed fromback    Family History  Problem Relation Age of Onset  . Coronary artery disease Father     CABG X5  . Diabetes Father   . Hypertension    . Kidney disease    . Colon cancer Maternal Aunt     Allergies  Allergen Reactions  . Sulfonamide Derivatives     REACTION: Hallucination    Current Outpatient Prescriptions on File Prior to Visit  Medication Sig Dispense Refill  . acetaminophen (TYLENOL)  500 MG tablet Take 500 mg by mouth every 6 (six) hours as needed.      [name] albuterol (PROAIR HFA) 108 (90 BASE) MCG/ACT inhaler Inhale 2 puffs into the lungs every 6 (six) hours as needed.  1 Inhaler  2  . amLODipine (NORVASC) 10 MG tablet Take 1 tablet (10 mg total) by mouth daily.  30 tablet  3  . aspirin 81 MG tablet Take 81 mg by mouth daily.        . cetirizine (ZYRTEC) 10 MG tablet Take 10 mg by mouth daily.        . famotidine (PEPCID) 20 MG tablet Take 20 mg by mouth 2 (two) times daily.       [name] losartan-hydrochlorothiazide (HYZAAR) 100-12.5 MG per tablet Take 1 tablet by mouth daily.  30 tablet  3  . metoprolol succinate (TOPROL-XL) 25 MG 24 hr tablet Take 1 tablet (25 mg total) by mouth daily.  30 tablet  3  . pravastatin (PRAVACHOL) 40 MG tablet Take 1 tablet (40 mg total) by mouth daily.  30 tablet  3  . Ranitidine HCl (ZANTAC PO) Take 1 tablet by mouth 2 (two) times daily.      [name]  Testosterone (ANDROGEL PUMP TD) Place onto the skin. Apply topically to each shoulder once a day.         BP 138/80  Pulse 75  Temp 98.3 F (36.8 C) (Oral)  Resp 16  Ht 5\' 7"  (1.702 m)  Wt 216 lb 0.6 oz (97.995 kg)  BMI 33.84 kg/m2  SpO2 98%    Objective:   Physical Exam  Constitutional: He appears well-developed and well-nourished. No distress.  Cardiovascular: Normal rate and regular rhythm.   No murmur heard. Pulmonary/Chest: Effort normal and breath sounds normal. No respiratory distress. He has no wheezes. He has no rales. He exhibits no tenderness.  Musculoskeletal: He exhibits no edema.  Lymphadenopathy:    He has no cervical adenopathy.  Skin: Skin is warm and dry.  Psychiatric: He has a normal mood and affect. His behavior is normal. Judgment and thought content normal.          Assessment & Plan:

## 2012-07-28 NOTE — Assessment & Plan Note (Signed)
Obtain A1C. 

## 2012-07-28 NOTE — Assessment & Plan Note (Signed)
He wishes to remain off of androgel for now which is reasonable.

## 2012-08-06 ENCOUNTER — Telehealth: Payer: Self-pay | Admitting: Family

## 2012-08-06 NOTE — Telephone Encounter (Signed)
He was treated for UTI in the end of November.  He needs OV as we need to obtain urine for culture.

## 2012-08-06 NOTE — Telephone Encounter (Signed)
Notified pt and scheduled appt for tomorrow at 3:15pm.

## 2012-08-06 NOTE — Telephone Encounter (Signed)
Patient states that he was seen at the end of January for a bladder infection?. He says that all symptoms, but fever has returned and would like to know if [name] needs to see him or can she call him in something?

## 2012-08-07 ENCOUNTER — Ambulatory Visit (INDEPENDENT_AMBULATORY_CARE_PROVIDER_SITE_OTHER): Payer: Managed Care, Other (non HMO) | Admitting: Family

## 2012-08-07 ENCOUNTER — Encounter: Payer: Self-pay | Admitting: Family

## 2012-08-07 VITALS — BP 148/80 | HR 75 | Temp 99.0°F | Resp 16 | Wt 215.0 lb

## 2012-08-07 DIAGNOSIS — N390 Urinary tract infection, site not specified: Secondary | ICD-10-CM | POA: Insufficient documentation

## 2012-08-07 DIAGNOSIS — L259 Unspecified contact dermatitis, unspecified cause: Secondary | ICD-10-CM

## 2012-08-07 DIAGNOSIS — L309 Dermatitis, unspecified: Secondary | ICD-10-CM

## 2012-08-07 DIAGNOSIS — L500 Allergic urticaria: Secondary | ICD-10-CM

## 2012-08-07 HISTORY — DX: Allergic urticaria: L50.0

## 2012-08-07 LAB — POCT URINALYSIS DIPSTICK
Bilirubin, UA: NEGATIVE
Glucose, UA: NEGATIVE
Nitrite, UA: NEGATIVE
Urobilinogen, UA: 0.2

## 2012-08-07 LAB — BASIC METABOLIC PANEL
CO2: 25 mEq/L (ref 19–32)
Calcium: 9.3 mg/dL (ref 8.4–10.5)
Creat: 0.89 mg/dL (ref 0.50–1.35)
Glucose, Bld: 97 mg/dL (ref 70–99)

## 2012-08-07 MED ORDER — BETAMETHASONE DIPROPIONATE 0.05 % EX CREA: TOPICAL_CREAM | Freq: Two times a day (BID) | CUTANEOUS | Status: DC

## 2012-08-07 MED ORDER — CIPROFLOXACIN HCL 500 MG PO TABS: 500.0000 mg | ORAL_TABLET | Freq: Two times a day (BID) | ORAL | Status: DC

## 2012-08-07 MED ORDER — TAMSULOSIN HCL 0.4 MG PO CAPS: 0.4000 mg | ORAL_CAPSULE | Freq: Every day | ORAL | Status: DC

## 2012-08-07 NOTE — Patient Instructions (Addendum)
Please complete your lab work prior to leaving. Call if your symptoms worsen or if not improved in 2-3 days. Go to ER if you are unable to urinate. You will be contacted about your referral to Urology.  Please let us know if you have not heard back within 1 week about your referral. Follow up in 3 months.

## 2012-08-07 NOTE — Assessment & Plan Note (Signed)
rx with cipro- refer to urology, I am concerned re: PVR causing UTI. Will also give trial of flomax.

## 2012-08-07 NOTE — Progress Notes (Signed)
Subjective:    Patient ID: Aaron Ruiz, male    DOB: Nov 09, 1955, 57 y.o.   MRN: [id number]  HPI  Aaron Ruiz is a 57 yr old male who presents today with chief complaint of urinary urgency. He had UTI in the end of November which grew enterococcus.  Notes Mild dysuria.  He denies associated fever, low back pain or abdominal pain. Reports that he sometimes feels like he is unable to fully empty his bladder.  Review of Systems See HPI  Past Medical History  Diagnosis Date  . Anemia   . Asthma   . GERD (gastroesophageal reflux disease)   . Hyperlipidemia   . Hypertension   . Diverticulosis   . Colon polyps   . Hyperglycemia 05/01/2011  . Borderline diabetes 05/03/2011    History   Social History  . Marital Status: Married    Spouse Name: N/A    Number of Children: N/A  . Years of Education: N/A   Occupational History  . Not on file.   Social History Main Topics  . Smoking status: Former Games developer  . Smokeless tobacco: Never Used  . Alcohol Use: No  . Drug Use: Not on file  . Sexually Active: Not on file   Other Topics Concern  . 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  son 59 - [name] (football scholarship - majoring in history) Alcohol use-noSmoking Status:  quitCaffeine use/day:  2-3 beverages daily    Past Surgical History  Procedure Date  . Inguinal hernia repair 1995  . Cystectomy 05/2004    removed fromback    Family History  Problem Relation Age of Onset  . Coronary artery disease Father     CABG X5  . Diabetes Father   . Hypertension    . Kidney disease    . Colon cancer Maternal Aunt     Allergies  Allergen Reactions  . Sulfonamide Derivatives     REACTION: Hallucination    Current Outpatient Prescriptions on File Prior to Visit  Medication Sig Dispense Refill  . acetaminophen (TYLENOL) 500 MG tablet Take 500 mg by mouth every 6 (six)  hours as needed.      [name] albuterol (PROAIR HFA) 108 (90 BASE) MCG/ACT inhaler Inhale 2 puffs into the lungs every 6 (six) hours as needed.  1 Inhaler  2  . amLODipine (NORVASC) 10 MG tablet Take 1 tablet (10 mg total) by mouth daily.  30 tablet  5  . aspirin 81 MG tablet Take 81 mg by mouth daily.        . cetirizine (ZYRTEC) 10 MG tablet Take 10 mg by mouth daily.        . famotidine (PEPCID) 20 MG tablet Take 20 mg by mouth 2 (two) times daily.       [name] losartan-hydrochlorothiazide (HYZAAR) 100-12.5 MG per tablet Take 1 tablet by mouth daily.  30 tablet  5  . metoprolol succinate (TOPROL-XL) 25 MG 24 hr tablet Take 1 tablet (25 mg total) by mouth daily.  30 tablet  5  . pravastatin (PRAVACHOL) 40 MG tablet Take 1 tablet (40 mg total) by mouth daily.  30 tablet  5    BP 148/80  Pulse 75  Temp 99 F (37.2 C) (Oral)  Resp 16  Wt 215 lb 0.6 oz (97.542 kg)  SpO2 98%  Objective:   Physical Exam  Constitutional: He appears well-developed and well-nourished. No distress.  Cardiovascular: Normal rate and regular rhythm.   No murmur heard. Pulmonary/Chest: Effort normal and breath sounds normal. No respiratory distress. He has no wheezes. He has no rales. He exhibits no tenderness.  Abdominal: Soft. Bowel sounds are normal.  Skin: Skin is warm and dry.       Skin rash noted on bilateral shins- red raised maculopapular rash          Assessment & Plan:

## 2012-08-07 NOTE — Assessment & Plan Note (Signed)
Trial of betamethasone cream.  

## 2012-08-08 ENCOUNTER — Encounter: Payer: Self-pay | Admitting: Family

## 2012-08-10 ENCOUNTER — Telehealth: Payer: Self-pay | Admitting: *Deleted

## 2012-08-10 DIAGNOSIS — N289 Disorder of kidney and ureter, unspecified: Secondary | ICD-10-CM

## 2012-08-10 LAB — URINE CULTURE: Colony Count: 45000

## 2012-08-10 NOTE — Telephone Encounter (Signed)
Message copied by Kathi Simpers on Mon Aug 10, 2012  3:42 PM ------      Message from: Sandford Craze      Created: Sat Aug 08, 2012 11:01 AM       Please let pt know that his BUN is very mildly elevated.  Lets have him repeat BMET in 1 week. Diagnosis is abnormal kidney function. ------

## 2012-08-10 NOTE — Telephone Encounter (Signed)
Pt notified. Lab order entered and given to the lab.

## 2012-08-14 LAB — BASIC METABOLIC PANEL
Calcium: 9 mg/dL (ref 8.4–10.5)
Creat: 0.94 mg/dL (ref 0.50–1.35)

## 2012-11-25 ENCOUNTER — Encounter: Payer: Self-pay | Admitting: Family

## 2012-11-25 ENCOUNTER — Ambulatory Visit (INDEPENDENT_AMBULATORY_CARE_PROVIDER_SITE_OTHER): Payer: Managed Care, Other (non HMO) | Admitting: Family

## 2012-11-25 VITALS — BP 120/82 | HR 73 | Temp 98.3°F | Resp 16 | Ht 67.0 in | Wt 215.1 lb

## 2012-11-25 DIAGNOSIS — Z0000 Encounter for general adult medical examination without abnormal findings: Secondary | ICD-10-CM

## 2012-11-25 LAB — CBC WITH DIFFERENTIAL/PLATELET
Basophils Absolute: 0 10*3/uL (ref 0.0–0.1)
HCT: 42.2 % (ref 39.0–52.0)
Hemoglobin: 14.3 g/dL (ref 13.0–17.0)
Lymphocytes Relative: 31 % (ref 12–46)
Monocytes Absolute: 0.3 10*3/uL (ref 0.1–1.0)
Monocytes Relative: 7 % (ref 3–12)
Neutro Abs: 2.7 10*3/uL (ref 1.7–7.7)
RDW: 13.9 % (ref 11.5–15.5)
WBC: 5.2 10*3/uL (ref 4.0–10.5)

## 2012-11-25 LAB — BASIC METABOLIC PANEL WITH GFR
Chloride: 105 mEq/L (ref 96–112)
GFR, Est Non African American: >89 mL/min
Potassium: 3.9 mEq/L (ref 3.5–5.3)
Sodium: 138 mEq/L (ref 135–145)

## 2012-11-25 LAB — LIPID PANEL
LDL Cholesterol: 102 mg/dL — ABNORMAL HIGH (ref 0–99)
VLDL: 41 mg/dL — ABNORMAL HIGH (ref 0–40)

## 2012-11-25 LAB — HEPATIC FUNCTION PANEL
ALT: 24 U/L (ref 0–53)
Bilirubin, Direct: 0.1 mg/dL (ref 0.0–0.3)
Indirect Bilirubin: 0.5 mg/dL (ref 0.0–0.9)

## 2012-11-25 LAB — TSH: TSH: 2.124 u[IU]/mL (ref 0.350–4.500)

## 2012-11-25 MED ORDER — OMEPRAZOLE 40 MG PO CPDR: 40.0000 mg | DELAYED_RELEASE_CAPSULE | Freq: Every day | ORAL | Status: DC

## 2012-11-25 NOTE — Assessment & Plan Note (Signed)
Immunizations reviewed and up to date.  Obtain fasting lab work.  Colo up to date. EKG today. Continue healthy diet and exercise.

## 2012-11-25 NOTE — Patient Instructions (Addendum)
Please complete your lab work prior to leaving. Follow up in 3 months.   

## 2012-11-25 NOTE — Progress Notes (Signed)
Subjective:    Patient ID: Aaron Ruiz, male    DOB: 1955-12-28, 57 y.o.   MRN: [id number]  HPI  Aaron Ruiz is a 57 yr old male who presents today for cpx.  Immunizations:  Tetanus is up to date. Diet: reports healthy diet Exercise: reports that he has started walking. He is walking often. Colonoscopy: last colo 2010 he was told to repeat in 2015  He is off of testosterone.    Review of Systems  Constitutional: Negative for unexpected weight change.  HENT: Negative for congestion.   Eyes: Negative for visual disturbance.  Respiratory: Negative for cough.   Cardiovascular: Negative for chest pain and leg swelling.  Gastrointestinal: Negative for vomiting, diarrhea, constipation and blood in stool.  Genitourinary:       Denies difficulty with urination or nocturia since he started flomax  Neurological: Negative for headaches.  Hematological: Negative for adenopathy.  Psychiatric/Behavioral:       Denies depression/anxiety   Past Medical History  Diagnosis Date  . Anemia   . Asthma   . GERD (gastroesophageal reflux disease)   . Hyperlipidemia   . Hypertension   . Diverticulosis   . Colon polyps   . Hyperglycemia 05/01/2011  . Borderline diabetes 05/03/2011    History   Social History  . Marital Status: Married    Spouse Name: N/A    Number of Children: N/A  . Years of Education: N/A   Occupational History  . Not on file.   Social History Main Topics  . Smoking status: Former Games developer  . Smokeless tobacco: Never Used  . Alcohol Use: No  . Drug Use: Not on file  . Sexually Active: Not on file   Other Topics Concern  . 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 Inguinal hernia repair  1995  . Cystectomy  05/2004    removed fromback    Family History  Problem Relation Age of Onset  . Coronary artery disease Father     CABG X5  . Diabetes Father   . Hypertension    . Kidney disease    . Colon cancer Maternal Aunt     Allergies  Allergen Reactions  . Sulfonamide Derivatives     REACTION: Hallucination    Current Outpatient Prescriptions on File Prior to Visit  Medication Sig Dispense Refill  . acetaminophen (TYLENOL) 500 MG tablet Take 500 mg by mouth every 6 (six) hours as needed.      [name] albuterol (PROAIR HFA) 108 (90 BASE) MCG/ACT inhaler Inhale 2 puffs into the lungs every 6 (six) hours as needed.  1 Inhaler  2  . amLODipine (NORVASC) 10 MG tablet Take 1 tablet (10 mg total) by mouth daily.  30 tablet  5  . aspirin 81 MG tablet Take 81 mg by mouth daily.        . cetirizine (ZYRTEC) 10 MG tablet Take 10 mg by mouth daily.        [name] losartan-hydrochlorothiazide (HYZAAR) 100-12.5 MG per tablet Take 1 tablet by mouth daily.  30 tablet  5  .  metoprolol succinate (TOPROL-XL) 25 MG 24 hr tablet Take 1 tablet (25 mg total) by mouth daily.  30 tablet  5  . pravastatin (PRAVACHOL) 40 MG tablet Take 1 tablet (40 mg total) by mouth daily.  30 tablet  5  . Tamsulosin HCl (FLOMAX) 0.4 MG CAPS Take 1 capsule (0.4 mg total) by mouth daily.  30 capsule  3   No current facility-administered medications on file prior to visit.    BP 120/82  Pulse 73  Temp(Src) 98.3 F (36.8 C) (Oral)  Resp 16  Ht 5\' 7"  (1.702 m)  Wt 215 lb 1.9 oz (97.578 kg)  BMI 33.68 kg/m2  SpO2 98%       Objective:   Physical Exam  Physical Exam  Constitutional: He is oriented to person, place, and time. He appears well-developed and well-nourished. No distress.  HENT:  Head: Normocephalic and atraumatic.  Right Ear: Tympanic membrane and ear canal normal.  Left Ear: Tympanic membrane and ear canal normal.  Mouth/Throat: Oropharynx is clear and  moist.  Eyes: Pupils are equal, round, and reactive to light. No scleral icterus.  Neck: Normal range of motion. No thyromegaly present.  Cardiovascular: Normal rate and regular rhythm.   No murmur heard. Pulmonary/Chest: Effort normal and breath sounds normal. No respiratory distress. He has no wheezes. He has no rales. He exhibits no tenderness.  Abdominal: Soft. Bowel sounds are normal. He exhibits no distension and no mass. There is no tenderness. There is no rebound and no guarding.  Musculoskeletal: He exhibits no edema.  Lymphadenopathy:    He has no cervical adenopathy.  Neurological: He is alert and oriented to person, place, and time.  He exhibits normal muscle tone. Coordination normal.  Skin: Skin is warm and dry.  Psychiatric: He has a normal mood and affect. His behavior is normal. Judgment and thought content normal.  GU:  Prostate is enlarged.  No nodules noted.  Stool is heme negative         Assessment & Plan:         Assessment & Plan:

## 2012-11-26 LAB — URINALYSIS, ROUTINE W REFLEX MICROSCOPIC
Glucose, UA: NEGATIVE mg/dL
Hgb urine dipstick: NEGATIVE
Leukocytes, UA: NEGATIVE
Protein, ur: NEGATIVE mg/dL
Urobilinogen, UA: 0.2 mg/dL (ref 0.0–1.0)

## 2012-11-26 LAB — PSA: PSA: 1.18 ng/mL (ref <=4.00)

## 2012-11-27 ENCOUNTER — Telehealth: Payer: Self-pay | Admitting: Family

## 2012-11-27 MED ORDER — OMEGA-3 FATTY ACIDS 1000 MG PO CAPS: 2.0000 g | ORAL_CAPSULE | Freq: Two times a day (BID) | ORAL | Status: DC

## 2012-11-27 NOTE — Telephone Encounter (Signed)
Pls let pt know that total cholesterol is good, but triglycerides remain elevated.  I would like him to use fish oil 2000mg  bid, and continue to limit concentrated sweets and white fluffy carbs.  This will also help his sugar which is mildly elevated.

## 2012-11-27 NOTE — Telephone Encounter (Signed)
Left message on voice mail for patient to return call concerning lab results.

## 2012-11-30 NOTE — Telephone Encounter (Signed)
Notified pt and he voices understanding. Needs copy of labs for employer wellness form. Advised him result should be available in mychart. He will let me know if he is unable to view results.

## 2012-12-03 ENCOUNTER — Telehealth: Payer: Self-pay | Admitting: Family

## 2012-12-03 DIAGNOSIS — N390 Urinary tract infection, site not specified: Secondary | ICD-10-CM

## 2012-12-03 MED ORDER — TAMSULOSIN HCL 0.4 MG PO CAPS: 0.4000 mg | ORAL_CAPSULE | Freq: Every day | ORAL | Status: DC

## 2012-12-03 NOTE — Telephone Encounter (Signed)
tamsulosin 0.4mg  capsules take 1 capsule by mouth daily qty 30

## 2012-12-03 NOTE — Telephone Encounter (Signed)
Refill sent.

## 2013-02-10 ENCOUNTER — Ambulatory Visit (INDEPENDENT_AMBULATORY_CARE_PROVIDER_SITE_OTHER): Payer: Managed Care, Other (non HMO) | Admitting: Family

## 2013-02-10 ENCOUNTER — Encounter: Payer: Self-pay | Admitting: Family

## 2013-02-10 VITALS — BP 160/80 | HR 84 | Temp 98.9°F | Resp 18 | Wt 223.1 lb

## 2013-02-10 DIAGNOSIS — R739 Hyperglycemia, unspecified: Secondary | ICD-10-CM

## 2013-02-10 DIAGNOSIS — R7303 Prediabetes: Secondary | ICD-10-CM

## 2013-02-10 DIAGNOSIS — K219 Gastro-esophageal reflux disease without esophagitis: Secondary | ICD-10-CM

## 2013-02-10 DIAGNOSIS — J45909 Unspecified asthma, uncomplicated: Secondary | ICD-10-CM

## 2013-02-10 DIAGNOSIS — R7309 Other abnormal glucose: Secondary | ICD-10-CM

## 2013-02-10 DIAGNOSIS — I10 Essential (primary) hypertension: Secondary | ICD-10-CM

## 2013-02-10 MED ORDER — PRAVASTATIN SODIUM 40 MG PO TABS: 40.0000 mg | ORAL_TABLET | Freq: Every day | ORAL | Status: DC

## 2013-02-10 MED ORDER — AMLODIPINE BESYLATE 10 MG PO TABS: 10.0000 mg | ORAL_TABLET | Freq: Every day | ORAL | Status: DC

## 2013-02-10 MED ORDER — METOPROLOL SUCCINATE ER 25 MG PO TB24: 25.0000 mg | ORAL_TABLET | Freq: Every day | ORAL | Status: DC

## 2013-02-10 MED ORDER — LOSARTAN POTASSIUM-HCTZ 100-12.5 MG PO TABS: 1.0000 | ORAL_TABLET | Freq: Every day | ORAL | Status: DC

## 2013-02-10 NOTE — Assessment & Plan Note (Signed)
Stable on prn albuterol.  

## 2013-02-10 NOTE — Assessment & Plan Note (Signed)
Improved on PPI, continue same.  

## 2013-02-10 NOTE — Assessment & Plan Note (Signed)
Deteriorated.  Pt ran out of bp med.  Resume. Refills sent.

## 2013-02-10 NOTE — Progress Notes (Signed)
Subjective:    Patient ID: Aaron Ruiz, male    DOB: [birth year], 57 y.o.   MRN: [id number]  HPI  Aaron Ruiz is a 57 yr old male who presents today for folow up of his htn.  Denies CP/sob or swelling.  Asthma- reports well controlled only using albuterol 3 x this year.   GERD-reports well controlled on prilosec.  Review of Systems    see HPI  Past Medical History  Diagnosis Date  . Anemia   . Asthma   . GERD (gastroesophageal reflux disease)   . Hyperlipidemia   . Hypertension   . Diverticulosis   . Colon polyps   . Hyperglycemia 05/01/2011  . Borderline diabetes 05/03/2011    History   Social History  . Marital Status: Married    Spouse Name: N/A    Number of Children: N/A  . Years of Education: N/A   Occupational History  . Not on file.   Social History Main Topics  . Smoking status: Former Games developer  . Smokeless tobacco: Never Used  . Alcohol Use: No  . Drug Use: Not on file  . Sexual Activity: Not on file   Other Topics Concern  . 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 Inguinal hernia repair  1995  . Cystectomy  05/2004    removed fromback    Family History  Problem Relation Age of Onset  . Coronary artery disease Father     CABG X5  . Diabetes Father   . Hypertension    . Kidney disease    . Colon cancer Maternal Aunt     Allergies  Allergen Reactions  . Sulfonamide Derivatives     REACTION: Hallucination    Current Outpatient Prescriptions on File Prior to Visit  Medication Sig Dispense Refill  . acetaminophen (TYLENOL) 500 MG tablet Take 500 mg by mouth every 6 (six) hours as needed.      [name] albuterol (PROAIR HFA) 108  (90 BASE) MCG/ACT inhaler Inhale 2 puffs into the lungs every 6 (six) hours as needed.  1 Inhaler  2  . aspirin 81 MG tablet Take 81 mg by mouth daily.        . cetirizine (ZYRTEC) 10 MG tablet Take 10 mg by mouth daily.        . fish oil-omega-3 fatty acids 1000 MG capsule Take 2 capsules (2 g total) by mouth 2 (two) times daily.      [name] omeprazole (PRILOSEC) 40 MG capsule Take 1 capsule (40 mg total) by mouth daily.  30 capsule  3  . tamsulosin (FLOMAX) 0.4 MG CAPS Take 1 capsule (0.4 mg total) by mouth daily.  30 capsule  3   No current facility-administered medications on file prior to visit.    BP 160/80  Pulse 84  Temp(Src) 98.9 F (37.2 C) (Oral)  Resp 18  Wt 223 lb 1.9 oz (101.207 kg)  BMI 34.94 kg/m2  SpO2 98%    Objective:   Physical Exam  Constitutional: He is oriented to person, place, and time. He appears well-developed and  well-nourished. No distress.  HENT:  Head: Normocephalic and atraumatic.  Cardiovascular: Normal rate and regular rhythm.   No murmur heard. Pulmonary/Chest: Effort normal and breath sounds normal. No respiratory distress. He has no wheezes. He has no rales. He exhibits no tenderness.  Musculoskeletal: He exhibits no edema.  Neurological: He is alert and oriented to person, place, and time.  Psychiatric: He has a normal mood and affect. His behavior is normal. Judgment and thought content normal.          Assessment & Plan:

## 2013-02-10 NOTE — Patient Instructions (Addendum)
Please complete lab work prior to leaving. Follow up in 3 months.  

## 2013-02-10 NOTE — Assessment & Plan Note (Signed)
Check follow up A1C. He has gained 8 pounds since last visit.

## 2013-02-11 ENCOUNTER — Telehealth: Payer: Self-pay | Admitting: *Deleted

## 2013-02-11 ENCOUNTER — Encounter: Payer: Self-pay | Admitting: Family

## 2013-02-11 NOTE — Telephone Encounter (Signed)
Received call from pt stating he was not aware that he was taking losartan hctz. Thinks he has been off of it for a while and was not expecting a refill on it. Pt is unsure of the when the medication may have stopped but states he will resume medication as office notes indicate he should be still be taking it. Pt also asked about hgb a1c result and was notified. Please advise if further instructions.

## 2013-02-11 NOTE — Telephone Encounter (Signed)
Lets have him resume med and then follow up in 2 weeks for nurse visit bp check to make sure bp stable.

## 2013-02-12 NOTE — Telephone Encounter (Signed)
Notified pt and scheduled 2 week nurse visit for BP check on 02/26/13 at 8:30.

## 2013-02-26 ENCOUNTER — Ambulatory Visit (INDEPENDENT_AMBULATORY_CARE_PROVIDER_SITE_OTHER): Payer: Managed Care, Other (non HMO) | Admitting: Family

## 2013-02-26 VITALS — BP 138/74 | HR 82

## 2013-02-26 DIAGNOSIS — I10 Essential (primary) hypertension: Secondary | ICD-10-CM

## 2013-02-26 NOTE — Assessment & Plan Note (Signed)
BP Readings from Last 3 Encounters:  02/26/13 138/74  02/10/13 160/80  11/25/12 120/82   BP stable now back on meds.  Continue same.

## 2013-03-29 ENCOUNTER — Other Ambulatory Visit: Payer: Self-pay | Admitting: Family

## 2013-03-29 NOTE — Telephone Encounter (Signed)
Rx request to pharmacy/[facility]  

## 2013-04-11 ENCOUNTER — Other Ambulatory Visit: Payer: Self-pay | Admitting: Family

## 2013-05-06 ENCOUNTER — Other Ambulatory Visit: Payer: Self-pay

## 2013-07-10 ENCOUNTER — Other Ambulatory Visit: Payer: Self-pay | Admitting: Family

## 2013-07-12 NOTE — Telephone Encounter (Signed)
Please let pt know that he was due for follow up of BP in November and needs to schedule follow up.

## 2013-07-13 NOTE — Telephone Encounter (Signed)
Left message for patient to return my call.

## 2013-07-14 NOTE — Telephone Encounter (Signed)
Spoke with pt and scheduled follow up for 07/30/13 at 8am.

## 2013-07-30 ENCOUNTER — Ambulatory Visit (INDEPENDENT_AMBULATORY_CARE_PROVIDER_SITE_OTHER): Payer: Managed Care, Other (non HMO) | Admitting: Family

## 2013-07-30 ENCOUNTER — Encounter: Payer: Self-pay | Admitting: Family

## 2013-07-30 VITALS — BP 130/80 | HR 83 | Temp 98.0°F | Resp 16 | Ht 67.0 in | Wt 222.0 lb

## 2013-07-30 DIAGNOSIS — R7303 Prediabetes: Secondary | ICD-10-CM

## 2013-07-30 DIAGNOSIS — E785 Hyperlipidemia, unspecified: Secondary | ICD-10-CM

## 2013-07-30 DIAGNOSIS — N390 Urinary tract infection, site not specified: Secondary | ICD-10-CM

## 2013-07-30 DIAGNOSIS — I10 Essential (primary) hypertension: Secondary | ICD-10-CM

## 2013-07-30 DIAGNOSIS — J45909 Unspecified asthma, uncomplicated: Secondary | ICD-10-CM

## 2013-07-30 DIAGNOSIS — R7309 Other abnormal glucose: Secondary | ICD-10-CM

## 2013-07-30 HISTORY — DX: Urinary tract infection, site not specified: N39.0

## 2013-07-30 LAB — BASIC METABOLIC PANEL WITH GFR
BUN: 12 mg/dL (ref 6–23)
CHLORIDE: 103 meq/L (ref 96–112)
CO2: 28 mEq/L (ref 19–32)
Calcium: 8.9 mg/dL (ref 8.4–10.5)
Creat: 0.81 mg/dL (ref 0.50–1.35)
Glucose, Bld: 135 mg/dL — ABNORMAL HIGH (ref 70–99)
POTASSIUM: 4.1 meq/L (ref 3.5–5.3)
Sodium: 138 mEq/L (ref 135–145)

## 2013-07-30 LAB — HEPATIC FUNCTION PANEL
ALT: 29 U/L (ref 0–53)
AST: 20 U/L (ref 0–37)
Albumin: 4.4 g/dL (ref 3.5–5.2)
Alkaline Phosphatase: 53 U/L (ref 39–117)
BILIRUBIN DIRECT: 0.1 mg/dL (ref 0.0–0.3)
Indirect Bilirubin: 0.4 mg/dL (ref 0.2–1.2)
Total Bilirubin: 0.5 mg/dL (ref 0.2–1.2)
Total Protein: 6.7 g/dL (ref 6.0–8.3)

## 2013-07-30 LAB — LIPID PANEL
CHOL/HDL RATIO: 5 ratio
CHOLESTEROL: 156 mg/dL (ref 0–200)
HDL: 31 mg/dL — ABNORMAL LOW (ref >39)
LDL Cholesterol: 82 mg/dL (ref 0–99)
Triglycerides: 214 mg/dL — ABNORMAL HIGH (ref <150)
VLDL: 43 mg/dL — ABNORMAL HIGH (ref 0–40)

## 2013-07-30 LAB — HEMOGLOBIN A1C
Hgb A1c MFr Bld: 6 % — ABNORMAL HIGH (ref <5.7)
MEAN PLASMA GLUCOSE: 126 mg/dL — AB (ref <117)

## 2013-07-30 MED ORDER — ALBUTEROL SULFATE HFA 108 (90 BASE) MCG/ACT IN AERS: 2.0000 | INHALATION_SPRAY | Freq: Four times a day (QID) | RESPIRATORY_TRACT | Status: DC | PRN

## 2013-07-30 MED ORDER — OMEPRAZOLE 40 MG PO CPDR: DELAYED_RELEASE_CAPSULE | ORAL | Status: DC

## 2013-07-30 MED ORDER — TAMSULOSIN HCL 0.4 MG PO CAPS: ORAL_CAPSULE | ORAL | Status: DC

## 2013-07-30 MED ORDER — METOPROLOL SUCCINATE ER 25 MG PO TB24: 25.0000 mg | ORAL_TABLET | Freq: Every day | ORAL | Status: DC

## 2013-07-30 MED ORDER — LOSARTAN POTASSIUM-HCTZ 100-12.5 MG PO TABS: 1.0000 | ORAL_TABLET | Freq: Every day | ORAL | Status: DC

## 2013-07-30 MED ORDER — AMLODIPINE BESYLATE 10 MG PO TABS: 10.0000 mg | ORAL_TABLET | Freq: Every day | ORAL | Status: DC

## 2013-07-30 MED ORDER — PRAVASTATIN SODIUM 40 MG PO TABS: 40.0000 mg | ORAL_TABLET | Freq: Every day | ORAL | Status: DC

## 2013-07-30 NOTE — Progress Notes (Signed)
Subjective:    Patient ID: Aaron Ruiz, male    DOB: [birth year], 58 y.o.   MRN: [id number]  HPI  Aaron Ruiz is a 58 yr old male who presents today for follow up.  1) HTN- current meds include amlodipine, hyzaar, toprol xl.  BP Readings from Last 3 Encounters:  07/30/13 130/80  02/26/13 138/74  02/10/13 160/80    2) Hyperglycemia- last A1C 8/14 was 5.7.  Started walking on a treadmill 3 days a week.   3) Hyperlipidemia- maintained on pravastatin.  Last LDL 102, Trigs were elevated.  Denies myalgia.   4) Hx of UTI/? retention- he is on flomax. Has not had any further UTI's since he started flomax.   5) asthma- reports rare use of albuterol.    Review of Systems See HPI  Past Medical History  Diagnosis Date  . Anemia   . Asthma   . GERD (gastroesophageal reflux disease)   . Hyperlipidemia   . Hypertension   . Diverticulosis   . Colon polyps   . Hyperglycemia 05/01/2011  . Borderline diabetes 05/03/2011    History   Social History  . Marital Status: Married    Spouse Name: N/A    Number of Children: N/A  . Years of Education: N/A   Occupational History  . Not on file.   Social History Main Topics  . Smoking status: Former Research scientist (life sciences)  . Smokeless tobacco: Never Used  . Alcohol Use: No  . Drug Use: Not on file  . Sexual Activity: Not on file   Other Topics Concern  . 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 Inguinal hernia repair  1995  . Cystectomy  05/2004    removed fromback    Family History  Problem Relation Age of Onset  . Coronary artery disease Father     CABG X5  . Diabetes Father   . Hypertension    . Kidney disease    .  Colon cancer Maternal Aunt     Allergies  Allergen Reactions  . Sulfonamide Derivatives     REACTION: Hallucination    Current Outpatient Prescriptions on File Prior to Visit  Medication Sig Dispense Refill  . acetaminophen (TYLENOL) 500 MG tablet Take 500 mg by mouth every 6 (six) hours as needed.      [name] albuterol (PROAIR HFA) 108 (90 BASE) MCG/ACT inhaler Inhale 2 puffs into the lungs every 6 (six) hours as needed.  1 Inhaler  2  . amLODipine (NORVASC) 10 MG tablet Take 1 tablet (10 mg total) by mouth daily.  30 tablet  5  . aspirin 81 MG tablet Take 81 mg by mouth daily.        . cetirizine (ZYRTEC) 10 MG tablet Take 10 mg by mouth daily.        . fish oil-omega-3 fatty acids 1000 MG capsule Take 2 capsules (2 g total) by mouth 2 (two) times daily.      [name] losartan-hydrochlorothiazide (HYZAAR) 100-12.5 MG per tablet Take 1 tablet by mouth daily.  30 tablet  5  .  metoprolol succinate (TOPROL-XL) 25 MG 24 hr tablet Take 1 tablet (25 mg total) by mouth daily.  30 tablet  5  . omeprazole (PRILOSEC) 40 MG capsule TAKE 1 CAPSULE BY MOUTH EVERY DAY  30 capsule  2  . pravastatin (PRAVACHOL) 40 MG tablet Take 1 tablet (40 mg total) by mouth daily.  30 tablet  5  . tamsulosin (FLOMAX) 0.4 MG CAPS capsule TAKE 1 CAPSULE BY MOUTH EVERY DAY  30 capsule  3   No current facility-administered medications on file prior to visit.    BP 130/80  Pulse 83  Temp(Src) 98 F (36.7 C) (Oral)  Resp 16  Ht 5\' 7"  (1.702 m)  Wt 222 lb (100.699 kg)  BMI 34.76 kg/m2  SpO2 99%       Objective:   Physical Exam  Constitutional: He is oriented to person, place, and time. He appears well-developed and well-nourished. No distress.  HENT:  Head: Normocephalic and atraumatic.  Cardiovascular: Normal rate and regular rhythm.   No murmur heard. Pulmonary/Chest: Effort normal and breath sounds normal. No respiratory distress. He has no wheezes. He has no rales. He exhibits no tenderness.  Musculoskeletal: He  exhibits no edema.  Neurological: He is alert and oriented to person, place, and time.  Psychiatric: He has a normal mood and affect. His behavior is normal. Judgment and thought content normal.          Assessment & Plan:

## 2013-07-30 NOTE — Progress Notes (Signed)
Pre visit review using our clinic review tool, if applicable. No additional management support is needed unless otherwise documented below in the visit note. 

## 2013-07-30 NOTE — Assessment & Plan Note (Signed)
No further UTI since being placed on flomax.  Continue same. PSA has been stable  Lab Results  Component Value Date   PSA 1.18 11/25/2012   PSA 0.85 11/14/2010   PSA 0.95 01/03/2010

## 2013-07-30 NOTE — Assessment & Plan Note (Signed)
Clinically stable, obtain A1C.  

## 2013-07-30 NOTE — Patient Instructions (Signed)
Please complete your lab work prior to leaving. Follow up in 6 months.  

## 2013-07-30 NOTE — Assessment & Plan Note (Signed)
Obtain lipid panel/lft, continue statin and fish oil

## 2013-07-30 NOTE — Assessment & Plan Note (Signed)
Stable on current meds.  Obtain bmet.  

## 2013-07-30 NOTE — Assessment & Plan Note (Signed)
Controlled, continue albuterol.

## 2013-08-02 ENCOUNTER — Encounter: Payer: Self-pay | Admitting: Family

## 2014-01-05 ENCOUNTER — Ambulatory Visit (INDEPENDENT_AMBULATORY_CARE_PROVIDER_SITE_OTHER): Payer: Managed Care, Other (non HMO) | Admitting: Family

## 2014-01-05 ENCOUNTER — Encounter: Payer: Self-pay | Admitting: Family

## 2014-01-05 ENCOUNTER — Telehealth: Payer: Self-pay | Admitting: Family

## 2014-01-05 VITALS — BP 112/78 | HR 62 | Temp 97.9°F | Resp 16 | Ht 66.5 in | Wt 198.1 lb

## 2014-01-05 DIAGNOSIS — E119 Type 2 diabetes mellitus without complications: Secondary | ICD-10-CM

## 2014-01-05 DIAGNOSIS — Z0000 Encounter for general adult medical examination without abnormal findings: Secondary | ICD-10-CM

## 2014-01-05 DIAGNOSIS — R7303 Prediabetes: Secondary | ICD-10-CM

## 2014-01-05 DIAGNOSIS — Z860100 Personal history of colon polyps, unspecified: Secondary | ICD-10-CM

## 2014-01-05 DIAGNOSIS — I10 Essential (primary) hypertension: Secondary | ICD-10-CM

## 2014-01-05 DIAGNOSIS — K2100 Gastro-esophageal reflux disease with esophagitis, without bleeding: Secondary | ICD-10-CM

## 2014-01-05 DIAGNOSIS — E785 Hyperlipidemia, unspecified: Secondary | ICD-10-CM

## 2014-01-05 DIAGNOSIS — J45909 Unspecified asthma, uncomplicated: Secondary | ICD-10-CM

## 2014-01-05 DIAGNOSIS — Z23 Encounter for immunization: Secondary | ICD-10-CM

## 2014-01-05 DIAGNOSIS — E291 Testicular hypofunction: Secondary | ICD-10-CM

## 2014-01-05 DIAGNOSIS — R7309 Other abnormal glucose: Secondary | ICD-10-CM

## 2014-01-05 LAB — CBC WITH DIFFERENTIAL/PLATELET
BASOS ABS: 0 10*3/uL (ref 0.0–0.1)
BASOS PCT: 0 % (ref 0–1)
EOS ABS: 0.2 10*3/uL (ref 0.0–0.7)
Eosinophils Relative: 4 % (ref 0–5)
HCT: 41.1 % (ref 39.0–52.0)
HEMOGLOBIN: 14.1 g/dL (ref 13.0–17.0)
Lymphocytes Relative: 34 % (ref 12–46)
Lymphs Abs: 1.5 10*3/uL (ref 0.7–4.0)
MCH: 28.6 pg (ref 26.0–34.0)
MCHC: 34.3 g/dL (ref 30.0–36.0)
MCV: 83.4 fL (ref 78.0–100.0)
MONOS PCT: 6 % (ref 3–12)
Monocytes Absolute: 0.3 10*3/uL (ref 0.1–1.0)
NEUTROS ABS: 2.4 10*3/uL (ref 1.7–7.7)
NEUTROS PCT: 56 % (ref 43–77)
Platelets: 129 10*3/uL — ABNORMAL LOW (ref 150–400)
RBC: 4.93 MIL/uL (ref 4.22–5.81)
RDW: 14.3 % (ref 11.5–15.5)
WBC: 4.3 10*3/uL (ref 4.0–10.5)

## 2014-01-05 LAB — HEMOGLOBIN A1C
Hgb A1c MFr Bld: 5.8 % — ABNORMAL HIGH (ref <5.7)
Mean Plasma Glucose: 120 mg/dL — ABNORMAL HIGH (ref <117)

## 2014-01-05 NOTE — Assessment & Plan Note (Signed)
BP stable on current meds. Continue same.  

## 2014-01-05 NOTE — Patient Instructions (Signed)
Please complete lab work prior to leaving. Follow up in 3 months. Keep up the great work with diet, exercise, weight loss.  You will be contacted about your referral to GI.  Please let us know if you have not heard back within 1 week about your referral.

## 2014-01-05 NOTE — Assessment & Plan Note (Signed)
Clinically stable off testosterone therapy.

## 2014-01-05 NOTE — Progress Notes (Signed)
Subjective:    Patient ID: Aaron Ruiz, male    DOB: May 25, 1956, 58 y.o.   MRN: [id number]  HPI  Patient presents today for complete physical.  Immunizations: would like shingles vaccine Diet: has changed his diet. He has eliminated sugars, chips Exercise: treadmill- walks 30 min a day 5-6 days a week Wt Readings from Last 3 Encounters:  01/05/14 198 lb 1.3 oz (89.848 kg)  07/30/13 222 lb (100.699 kg)  02/10/13 223 lb 1.9 oz (101.207 kg)  Colonoscopy: 2010 Dental: overdue  Eye exam: last eye exam last year  HTN-  BP Readings from Last 3 Encounters:  01/05/14 112/78  07/30/13 130/80  02/26/13 138/74   Asthma- reports that asthma is well controlled.   GERD- stable on prilosec.    Hypogonadism- he is off of androgel, reports feeling fine.  Review of Systems    see hpi  Past Medical History  Diagnosis Date  . Anemia   . Asthma   . GERD (gastroesophageal reflux disease)   . Hyperlipidemia   . Hypertension   . Diverticulosis   . Colon polyps   . Hyperglycemia 05/01/2011  . Borderline diabetes 05/03/2011    History   Social History  . Marital Status: Married    Spouse Name: N/A    Number of Children: N/A  . Years of Education: N/A   Occupational History  . Not on file.   Social History Main Topics  . Smoking status: Former Research scientist (life sciences)  . Smokeless tobacco: Never Used  . Alcohol Use: No  . Drug Use: Not on file  . Sexual Activity: Not on file   Other Topics Concern  . 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 Inguinal hernia repair  1995  . Cystectomy  05/2004    removed fromback    Family History  Problem Relation Age of Onset  .  Coronary artery disease Father     CABG X5  . Diabetes Father   . Hypertension    . Kidney disease    . Colon cancer Maternal Aunt   . Cancer Brother     lung    Allergies  Allergen Reactions  . Sulfonamide Derivatives     REACTION: Hallucination    Current Outpatient Prescriptions on File Prior to Visit  Medication Sig Dispense Refill  . acetaminophen (TYLENOL) 500 MG tablet Take 500 mg by mouth every 6 (six) hours as needed.      [name] albuterol (PROAIR HFA) 108 (90 BASE) MCG/ACT inhaler Inhale 2 puffs into the lungs every 6 (six) hours as needed.  1 Inhaler  2  . amLODipine (NORVASC) 10 MG tablet Take 1 tablet (10 mg total) by mouth daily.  30 tablet  5  . aspirin 81 MG tablet Take 81 mg by mouth daily.        . cetirizine (ZYRTEC) 10 MG tablet Take 10 mg by mouth daily.        [name] losartan-hydrochlorothiazide (HYZAAR) 100-12.5 MG per tablet Take 1 tablet by mouth daily.  30 tablet  5  . metoprolol succinate (TOPROL-XL)  25 MG 24 hr tablet Take 1 tablet (25 mg total) by mouth daily.  30 tablet  5  . omeprazole (PRILOSEC) 40 MG capsule TAKE 1 CAPSULE BY MOUTH EVERY DAY  30 capsule  5  . pravastatin (PRAVACHOL) 40 MG tablet Take 1 tablet (40 mg total) by mouth daily.  30 tablet  5  . tamsulosin (FLOMAX) 0.4 MG CAPS capsule TAKE 1 CAPSULE BY MOUTH EVERY DAY  30 capsule  5   No current facility-administered medications on file prior to visit.    BP 112/78  Pulse 62  Temp(Src) 97.9 F (36.6 C) (Oral)  Resp 16  Ht 5' 6.5" (1.689 m)  Wt 198 lb 1.3 oz (89.848 kg)  BMI 31.50 kg/m2  SpO2 97%    Objective:   Physical Exam  Constitutional: He is oriented to person, place, and time. He appears well-developed and well-nourished. No distress.  HENT:  Head: Normocephalic and atraumatic.  Right Ear: Tympanic membrane and ear canal normal.  Left Ear: Tympanic membrane and ear canal normal.  Mouth/Throat: No oropharyngeal exudate, posterior oropharyngeal edema or posterior oropharyngeal  erythema.  Cardiovascular: Normal rate and regular rhythm.   No murmur heard. Pulmonary/Chest: Effort normal and breath sounds normal. No respiratory distress. He has no wheezes. He has no rales. He exhibits no tenderness.  Abdominal: Soft. Bowel sounds are normal. He exhibits no distension and no mass. There is no tenderness. There is no rebound and no guarding.  Musculoskeletal: He exhibits no edema.  Neurological: He is alert and oriented to person, place, and time.  Reflex Scores:      Patellar reflexes are 2+ on the right side and 2+ on the left side. Skin: Skin is warm and dry.  Psychiatric: He has a normal mood and affect. His behavior is normal. Judgment and thought content normal.          Assessment & Plan:

## 2014-01-05 NOTE — Assessment & Plan Note (Signed)
Obtain follow up lipid panel.

## 2014-01-05 NOTE — Progress Notes (Signed)
Pre visit review using our clinic review tool, if applicable. No additional management support is needed unless otherwise documented below in the visit note. 

## 2014-01-05 NOTE — Assessment & Plan Note (Signed)
Stable, monitor.  °

## 2014-01-05 NOTE — Telephone Encounter (Signed)
Relevant patient education mailed to patient.  

## 2014-01-05 NOTE — Assessment & Plan Note (Signed)
Stable on PPI, continue same.  

## 2014-01-05 NOTE — Assessment & Plan Note (Signed)
Encouraged pt to continue healthy diet, exercise, weight loss efforts. EKG, fasting labs today. Zostavax today at pt request.  Obtain fasting lab work.

## 2014-01-05 NOTE — Assessment & Plan Note (Signed)
Obtain A1C,  Urine microalbumin.  Eye exam is up to date.

## 2014-01-06 ENCOUNTER — Encounter: Payer: Self-pay | Admitting: Family

## 2014-01-06 LAB — BASIC METABOLIC PANEL WITH GFR
BUN: 16 mg/dL (ref 6–23)
CO2: 22 mEq/L (ref 19–32)
CREATININE: 0.81 mg/dL (ref 0.50–1.35)
Calcium: 9.3 mg/dL (ref 8.4–10.5)
Chloride: 104 mEq/L (ref 96–112)
GFR, Est Non African American: >89 mL/min
Glucose, Bld: 100 mg/dL — ABNORMAL HIGH (ref 70–99)
Potassium: 4 mEq/L (ref 3.5–5.3)
Sodium: 138 mEq/L (ref 135–145)

## 2014-01-06 LAB — LIPID PANEL
CHOL/HDL RATIO: 4.4 ratio
CHOLESTEROL: 149 mg/dL (ref 0–200)
HDL: 34 mg/dL — ABNORMAL LOW (ref >39)
LDL Cholesterol: 85 mg/dL (ref 0–99)
TRIGLYCERIDES: 149 mg/dL (ref <150)
VLDL: 30 mg/dL (ref 0–40)

## 2014-01-06 LAB — URINALYSIS, ROUTINE W REFLEX MICROSCOPIC
Bilirubin Urine: NEGATIVE
GLUCOSE, UA: NEGATIVE mg/dL
HGB URINE DIPSTICK: NEGATIVE
Ketones, ur: NEGATIVE mg/dL
LEUKOCYTES UA: NEGATIVE
NITRITE: NEGATIVE
PROTEIN: NEGATIVE mg/dL
Specific Gravity, Urine: 1.005 (ref 1.005–1.030)
Urobilinogen, UA: 0.2 mg/dL (ref 0.0–1.0)
pH: 6.5 (ref 5.0–8.0)

## 2014-01-06 LAB — HEPATIC FUNCTION PANEL
ALBUMIN: 4.8 g/dL (ref 3.5–5.2)
ALT: 20 U/L (ref 0–53)
AST: 18 U/L (ref 0–37)
Alkaline Phosphatase: 54 U/L (ref 39–117)
BILIRUBIN DIRECT: 0.1 mg/dL (ref 0.0–0.3)
Indirect Bilirubin: 0.5 mg/dL (ref 0.2–1.2)
Total Bilirubin: 0.6 mg/dL (ref 0.2–1.2)
Total Protein: 7 g/dL (ref 6.0–8.3)

## 2014-01-06 LAB — MICROALBUMIN / CREATININE URINE RATIO
CREATININE, URINE: 30.7 mg/dL
Microalb Creat Ratio: 16.3 mg/g (ref 0.0–30.0)
Microalb, Ur: 0.5 mg/dL (ref 0.00–1.89)

## 2014-01-06 LAB — TSH: TSH: 1.354 u[IU]/mL (ref 0.350–4.500)

## 2014-02-03 ENCOUNTER — Other Ambulatory Visit: Payer: Self-pay | Admitting: Family

## 2014-02-16 ENCOUNTER — Encounter: Payer: Self-pay | Admitting: Internal Medicine

## 2014-03-01 ENCOUNTER — Encounter: Payer: Self-pay | Admitting: Family

## 2014-03-08 ENCOUNTER — Other Ambulatory Visit: Payer: Self-pay | Admitting: Family

## 2014-04-12 ENCOUNTER — Ambulatory Visit: Payer: Managed Care, Other (non HMO) | Admitting: Family

## 2014-04-13 ENCOUNTER — Ambulatory Visit: Payer: Managed Care, Other (non HMO) | Admitting: Family

## 2014-04-15 ENCOUNTER — Ambulatory Visit: Payer: Managed Care, Other (non HMO) | Admitting: Family

## 2014-04-19 ENCOUNTER — Encounter: Payer: Self-pay | Admitting: Family

## 2014-04-19 ENCOUNTER — Ambulatory Visit (INDEPENDENT_AMBULATORY_CARE_PROVIDER_SITE_OTHER): Payer: Managed Care, Other (non HMO) | Admitting: Family

## 2014-04-19 VITALS — BP 144/76 | HR 60 | Temp 97.6°F | Resp 16 | Ht 66.5 in | Wt 194.6 lb

## 2014-04-19 DIAGNOSIS — I10 Essential (primary) hypertension: Secondary | ICD-10-CM

## 2014-04-19 DIAGNOSIS — Z23 Encounter for immunization: Secondary | ICD-10-CM

## 2014-04-19 DIAGNOSIS — R7309 Other abnormal glucose: Secondary | ICD-10-CM

## 2014-04-19 DIAGNOSIS — R7303 Prediabetes: Secondary | ICD-10-CM

## 2014-04-19 NOTE — Progress Notes (Signed)
   Subjective:    Patient ID: MICKY SHELLER, male    DOB: 1956/05/31, 58 y.o.   MRN: [id number]  HPI Mr. Padmanabhan is a 58 year old male who presents today for follow up.  1.Prediabetes - Last A1C was 5.8 in July. Currently controlled on diet and exercise.  Uses the treadmill 5-6 days per week for 30-40 minutes.  Notes that he no longer takes the elevator and uses steps where ever he goes.  Maintains a low carb diet, eats more fruit and salads, does not eat sweets.  Denies polydipsia, polyphagia, and polyuria.     2. Hypertension - Stable on meds. BP is 144/76 today.  Currently takes losartan-hctz, amlodipine and  Metoprolol.  Reports high compliance with medicine.  BP Readings from Last 3 Encounters:  04/19/14 144/76  01/05/14 112/78  07/30/13 130/80    Review of Systems  Constitutional: Negative for appetite change, fatigue and unexpected weight change.  Respiratory: Negative.  Negative for cough, chest tightness and shortness of breath.   Cardiovascular: Negative.  Negative for chest pain, palpitations and leg swelling.  Gastrointestinal: Negative.   Skin: Negative for rash and wound.       Objective:   Physical Exam  Constitutional: He is oriented to person, place, and time. He appears well-developed and well-nourished.  HENT:  Head: Atraumatic.  Neck: Normal range of motion. Neck supple. No JVD present.  Cardiovascular: Normal rate, regular rhythm, normal heart sounds and intact distal pulses.   No murmur heard. Pulmonary/Chest: Effort normal and breath sounds normal. No respiratory distress.  Musculoskeletal: Normal range of motion.  Neurological: He is alert and oriented to person, place, and time.  Skin: Skin is warm and dry.  Psychiatric: He has a normal mood and affect. His behavior is normal.          Assessment & Plan:  Continue with current medications.  Continue with healthy diet and daily exercise. Will need to follow up in 3 months.  Would like flu shot  today.    I have personally seen and examined patient and agree with Jettie Booze NP student's assessment and plan.

## 2014-04-19 NOTE — Progress Notes (Signed)
Pre visit review using our clinic review tool, if applicable. No additional management support is needed unless otherwise documented below in the visit note. 

## 2014-04-19 NOTE — Assessment & Plan Note (Signed)
Stable with diet and exercise.  Will need A1C at next visit in 3 months.

## 2014-04-19 NOTE — Patient Instructions (Signed)
Please schedule a follow up appointment in 3 months.

## 2014-04-19 NOTE — Assessment & Plan Note (Signed)
BP stable on meds.  Will obtain Bmet at next visit in 3 months.

## 2014-05-03 ENCOUNTER — Encounter: Payer: Self-pay | Admitting: Internal Medicine

## 2014-07-05 ENCOUNTER — Other Ambulatory Visit: Payer: Self-pay | Admitting: Family

## 2014-07-06 ENCOUNTER — Other Ambulatory Visit: Payer: Self-pay | Admitting: Family

## 2014-07-29 ENCOUNTER — Encounter: Payer: Self-pay | Admitting: Family

## 2014-07-29 ENCOUNTER — Ambulatory Visit (INDEPENDENT_AMBULATORY_CARE_PROVIDER_SITE_OTHER): Payer: Managed Care, Other (non HMO) | Admitting: Family

## 2014-07-29 VITALS — BP 120/68 | HR 58 | Temp 97.6°F | Resp 18 | Ht 66.5 in | Wt 197.6 lb

## 2014-07-29 DIAGNOSIS — R739 Hyperglycemia, unspecified: Secondary | ICD-10-CM

## 2014-07-29 DIAGNOSIS — E785 Hyperlipidemia, unspecified: Secondary | ICD-10-CM

## 2014-07-29 DIAGNOSIS — I10 Essential (primary) hypertension: Secondary | ICD-10-CM

## 2014-07-29 DIAGNOSIS — N400 Enlarged prostate without lower urinary tract symptoms: Secondary | ICD-10-CM

## 2014-07-29 DIAGNOSIS — R7309 Other abnormal glucose: Secondary | ICD-10-CM

## 2014-07-29 DIAGNOSIS — R7303 Prediabetes: Secondary | ICD-10-CM

## 2014-07-29 HISTORY — DX: Benign prostatic hyperplasia without lower urinary tract symptoms: N40.0

## 2014-07-29 LAB — BASIC METABOLIC PANEL
BUN: 22 mg/dL (ref 6–23)
CALCIUM: 9.5 mg/dL (ref 8.4–10.5)
CHLORIDE: 105 meq/L (ref 96–112)
CO2: 27 mEq/L (ref 19–32)
Creatinine, Ser: 0.87 mg/dL (ref 0.40–1.50)
GFR: 95.12 mL/min (ref >60.00)
Glucose, Bld: 107 mg/dL — ABNORMAL HIGH (ref 70–99)
Potassium: 4.6 mEq/L (ref 3.5–5.1)
Sodium: 141 mEq/L (ref 135–145)

## 2014-07-29 LAB — HEMOGLOBIN A1C: HEMOGLOBIN A1C: 5.9 % (ref 4.6–6.5)

## 2014-07-29 LAB — PSA: PSA: 0.94 ng/mL (ref 0.10–4.00)

## 2014-07-29 NOTE — Assessment & Plan Note (Signed)
Obtain follow up a1c.  

## 2014-07-29 NOTE — Assessment & Plan Note (Signed)
BP stable on current meds. Continue same. Obtain bmet

## 2014-07-29 NOTE — Progress Notes (Signed)
Pre visit review using our clinic review tool, if applicable. No additional management support is needed unless otherwise documented below in the visit note. 

## 2014-07-29 NOTE — Progress Notes (Signed)
   Subjective:    Patient ID: Aaron Ruiz, male    DOB: 04-26-1956, 59 y.o.   MRN: [id number]  HPI  Patient here for follow up of multiple medical problems:    HTN-Patient is currently maintained on the following medications for blood pressure: on amlodipine 10, hyzaar, toprol xl. BP Readings from Last 3 Encounters:  07/29/14 120/68  04/19/14 144/76  01/05/14 112/78  Patient reports good compliance with blood pressure medications. Patient denies chest pain, shortness of breath or swelling.  Hyperlipidemia-Patient is currently maintained on the following medication for hyperlipidemia: pravastatin, fish oil Last lipid panel as follows: Lab Results  Component Value Date   CHOL 149 01/05/2014   HDL 34* 01/05/2014   LDLCALC 85 01/05/2014   TRIG 149 01/05/2014   CHOLHDL 4.4 01/05/2014  Patient denies myalgia. Patient reports good compliance with low fat/low cholesterol diet.   BPH- maintained on flomax.  No nocturia.   Past Medical History  Diagnosis Date  . Anemia   . Asthma   . GERD (gastroesophageal reflux disease)   . Hyperlipidemia   . Hypertension   . Diverticulosis   . Colon polyps   . Hyperglycemia 05/01/2011  . Borderline diabetes 05/03/2011    Review of Systems     Objective:    Physical Exam  Constitutional: He is oriented to person, place, and time. He appears well-developed and well-nourished. No distress.  HENT:  Head: Normocephalic and atraumatic.  Eyes: No scleral icterus.  Cardiovascular: Normal rate and regular rhythm.   No murmur heard. Pulmonary/Chest: Effort normal and breath sounds normal. No respiratory distress. He has no wheezes. He has no rales. He exhibits no tenderness.  Neurological: He is alert and oriented to person, place, and time.  Skin: Skin is warm and dry.  Psychiatric: He has a normal mood and affect. His behavior is normal. Judgment and thought content normal.       Ht 5' 6.5" (1.689 m) Wt Readings from Last 3  Encounters:  04/19/14 194 lb 9.6 oz (88.27 kg)  01/05/14 198 lb 1.3 oz (89.848 kg)  07/30/13 222 lb (100.699 kg)     Lab Results  Component Value Date   WBC 4.3 01/05/2014   HGB 14.1 01/05/2014   HCT 41.1 01/05/2014   PLT 129* 01/05/2014   GLUCOSE 100* 01/05/2014   CHOL 149 01/05/2014   TRIG 149 01/05/2014   HDL 34* 01/05/2014   LDLCALC 85 01/05/2014   ALT 20 01/05/2014   AST 18 01/05/2014   NA 138 01/05/2014   K 4.0 01/05/2014   CL 104 01/05/2014   CREATININE 0.81 01/05/2014   BUN 16 01/05/2014   CO2 22 01/05/2014   TSH 1.354 01/05/2014   PSA 1.18 11/25/2012   HGBA1C 5.8* 01/05/2014   MICROALBUR 0.50 01/05/2014      Assessment & Plan:   Problem List Items Addressed This Visit    None       O'SULLIVAN,Rameen Gohlke S., NP

## 2014-07-29 NOTE — Progress Notes (Signed)
   Subjective:    Patient ID: Aaron Ruiz, male    DOB: [birth year], 59 y.o.   MRN: [id number]  HPI  Mr. Aaron Ruiz is a 59 yr old male who presents today    Review of Systems     Objective:   Physical Exam        Assessment & Plan:

## 2014-07-29 NOTE — Assessment & Plan Note (Signed)
Lipids stable on statin/fish oil, encouraged exercise to raise hdl.  Continue same.

## 2014-07-29 NOTE — Patient Instructions (Signed)
Please schedule fasting physical in 6 months. Complete lab work prior to leaving.

## 2014-07-29 NOTE — Assessment & Plan Note (Signed)
Stable on flomax, continue same.  

## 2014-09-01 ENCOUNTER — Encounter: Payer: Self-pay | Admitting: Internal Medicine

## 2014-10-04 ENCOUNTER — Other Ambulatory Visit: Payer: Self-pay | Admitting: Family

## 2014-10-05 ENCOUNTER — Other Ambulatory Visit: Payer: Self-pay | Admitting: Family

## 2014-10-05 NOTE — Telephone Encounter (Signed)
Refill sent per [facility] refill protocol/SLS  

## 2014-10-18 ENCOUNTER — Ambulatory Visit (AMBULATORY_SURGERY_CENTER): Payer: Self-pay

## 2014-10-18 VITALS — Ht 67.0 in | Wt 199.0 lb

## 2014-10-18 DIAGNOSIS — Z86010 Personal history of colonic polyps: Secondary | ICD-10-CM

## 2014-10-18 DIAGNOSIS — Z800 Family history of malignant neoplasm of digestive organs: Secondary | ICD-10-CM

## 2014-10-18 NOTE — Progress Notes (Signed)
Per pt, no allergies to soy or egg products.Pt not taking any weight loss meds or using  O2 at home. 

## 2014-10-21 ENCOUNTER — Other Ambulatory Visit: Payer: Self-pay | Admitting: Family

## 2014-10-21 NOTE — Telephone Encounter (Signed)
Refill sent per [facility] refill protocol/SLS  

## 2014-10-27 ENCOUNTER — Ambulatory Visit (AMBULATORY_SURGERY_CENTER): Payer: Managed Care, Other (non HMO) | Admitting: Internal Medicine

## 2014-10-27 ENCOUNTER — Encounter: Payer: Self-pay | Admitting: Internal Medicine

## 2014-10-27 VITALS — BP 111/69 | HR 70 | Temp 98.0°F | Resp 10 | Ht 67.0 in | Wt 199.0 lb

## 2014-10-27 DIAGNOSIS — K5730 Diverticulosis of large intestine without perforation or abscess without bleeding: Secondary | ICD-10-CM | POA: Diagnosis not present

## 2014-10-27 DIAGNOSIS — Z86010 Personal history of colonic polyps: Secondary | ICD-10-CM

## 2014-10-27 MED ORDER — SODIUM CHLORIDE 0.9 % IV SOLN: 500.0000 mL | INTRAVENOUS | Status: DC

## 2014-10-27 NOTE — Patient Instructions (Addendum)
No polyps this time! You do have diverticulosis - thickened muscle rings and pouches in the colon wall. Please read the handout about this condition.  I appreciate the opportunity to care for you.  I appreciate the opportunity to care for you. Gatha Mayer, MD, FACG    YOU HAD AN ENDOSCOPIC PROCEDURE TODAY AT [facility]:   Refer to the procedure report that was given to you for any specific questions about what was found during the examination.  If the procedure report does not answer your questions, please call your gastroenterologist to clarify.  If you requested that your care partner not be given the details of your procedure findings, then the procedure report has been included in a sealed envelope for you to review at your convenience later.  YOU SHOULD EXPECT: Some feelings of bloating in the abdomen. Passage of more gas than usual.  Walking can help get rid of the air that was put into your GI tract during the procedure and reduce the bloating. If you had a lower endoscopy (such as a colonoscopy or flexible sigmoidoscopy) you may notice spotting of blood in your stool or on the toilet paper. If you underwent a bowel prep for your procedure, you may not have a normal bowel movement for a few days.  Please Note:  You might notice some irritation and congestion in your nose or some drainage.  This is from the oxygen used during your procedure.  There is no need for concern and it should clear up in a day or so.  SYMPTOMS TO REPORT IMMEDIATELY:   Following lower endoscopy (colonoscopy or flexible sigmoidoscopy):  Excessive amounts of blood in the stool  Significant tenderness or worsening of abdominal pains  Swelling of the abdomen that is new, acute  Fever of 100F or higher   Following upper endoscopy (EGD)  Vomiting of blood or coffee ground material  New chest pain or pain under the shoulder blades  Painful or persistently difficult swallowing  New  shortness of breath  Fever of 100F or higher  Black, tarry-looking stools  For urgent or emergent issues, a gastroenterologist can be reached at any hour by calling [phone].   DIET: Your first meal following the procedure should be a small meal and then it is ok to progress to your normal diet. Heavy or fried foods are harder to digest and may make you feel nauseous or bloated.  Likewise, meals heavy in dairy and vegetables can increase bloating.  Drink plenty of fluids but you should avoid alcoholic beverages for 24 hours.  ACTIVITY:  You should plan to take it easy for the rest of today and you should NOT DRIVE or use heavy machinery until tomorrow (because of the sedation medicines used during the test).    FOLLOW UP: Our staff will call the number listed on your records the next business day following your procedure to check on you and address any questions or concerns that you may have regarding the information given to you following your procedure. If we do not reach you, we will leave a message.  However, if you are feeling well and you are not experiencing any problems, there is no need to return our call.  We will assume that you have returned to your regular daily activities without incident.  If any biopsies were taken you will be contacted by phone or by letter within the next 1-3 weeks.  Please call us at [phone]  if you have not heard about the biopsies in 3 weeks.    SIGNATURES/CONFIDENTIALITY: You and/or your care partner have signed paperwork which will be entered into your electronic medical record.  These signatures attest to the fact that that the information above on your After Visit Summary has been reviewed and is understood.  Full responsibility of the confidentiality of this discharge information lies with you and/or your care-partner.    Information on diverticulosis given to you today

## 2014-10-27 NOTE — Progress Notes (Signed)
Report to PACU, RN, vss, BBS= Clear.  

## 2014-10-27 NOTE — Op Note (Signed)
[facility]  [address] [place], [id number]   COLONOSCOPY PROCEDURE REPORT  PATIENT: Aaron Ruiz, Aaron Ruiz  MR#: [id number] BIRTHDATE: 09/15/1955 , 59  yrs. old GENDER: male ENDOSCOPIST: Gatha Mayer, MD, [facility] PROCEDURE DATE:  10/27/2014 PROCEDURE:   Colonoscopy, surveillance First Screening Colonoscopy - Avg.  risk and is 50 yrs.  old or older - No.  Prior Negative Screening - Now for repeat screening. N/A  History of Adenoma - Now for follow-up colonoscopy & has been > or = to 3 yrs.  Yes hx of adenoma.  Has been 3 or more years since last colonoscopy. ASA CLASS:   Class II INDICATIONS:Surveillance due to prior colonic neoplasia and PH Colon Adenoma. MEDICATIONS: Propofol 200 mg IV and Monitored anesthesia care  DESCRIPTION OF PROCEDURE:   After the risks benefits and alternatives of the procedure were thoroughly explained, informed consent was obtained.  The digital rectal exam revealed no abnormalities of the rectum, revealed no prostatic nodules, and revealed the prostate was not enlarged.   The [facility] [id number] endoscope was introduced through the anus and advanced to the cecum, which was identified by both the appendix and ileocecal valve. No adverse events experienced.   The quality of the prep was good.  (MiraLax was used)  The instrument was then slowly withdrawn as the colon was fully examined.      COLON FINDINGS: There was moderate diverticulosis noted in the sigmoid colon.   The examination was otherwise normal.  Retroflexed views revealed no abnormalities. The time to cecum = 2.3 Withdrawal time = 9.5   The scope was withdrawn and the procedure completed. COMPLICATIONS: There were no immediate complications.  ENDOSCOPIC IMPRESSION: 1.   Moderate diverticulosis was noted in the sigmoid colon 2.   The examination was otherwise normal - hx 2 adenomas < 1 cm 2010  RECOMMENDATIONS: Repeat colonoscopy 10 years. 2026  eSigned:  Gatha Mayer, MD, [facility] [place] 10/27/2014 2:29 PM   cc: The Patient

## 2014-10-28 ENCOUNTER — Telehealth: Payer: Self-pay

## 2014-10-28 NOTE — Telephone Encounter (Signed)
  Follow up Call-  Call back number 10/27/2014  Post procedure Call Back phone  # [phone]  Permission to leave phone message Yes     Patient questions:  Do you have a fever, pain , or abdominal swelling? No. Pain Score  0 *  Have you tolerated food without any problems? Yes.    Have you been able to return to your normal activities? Yes.    Do you have any questions about your discharge instructions: Diet   No. Medications  No. Follow up visit  No.  Do you have questions or concerns about your Care? No.  Actions: * If pain score is 4 or above: No action needed, pain <4.

## 2014-11-01 ENCOUNTER — Other Ambulatory Visit: Payer: Self-pay | Admitting: Family

## 2014-11-02 ENCOUNTER — Other Ambulatory Visit: Payer: Self-pay | Admitting: Family

## 2015-01-16 ENCOUNTER — Telehealth: Payer: Self-pay | Admitting: Family

## 2015-01-16 NOTE — Telephone Encounter (Signed)
pre visit letter mailed 01/11/15

## 2015-01-31 ENCOUNTER — Telehealth: Payer: Self-pay | Admitting: Behavioral Health

## 2015-01-31 ENCOUNTER — Encounter: Payer: Self-pay | Admitting: Behavioral Health

## 2015-01-31 NOTE — Telephone Encounter (Signed)
Pre-Visit Call completed with patient and chart updated.   Pre-Visit Info documented in Specialty Comments under [employer].    

## 2015-02-01 ENCOUNTER — Ambulatory Visit (INDEPENDENT_AMBULATORY_CARE_PROVIDER_SITE_OTHER): Payer: Managed Care, Other (non HMO) | Admitting: Family

## 2015-02-01 ENCOUNTER — Encounter: Payer: Self-pay | Admitting: Family

## 2015-02-01 VITALS — BP 124/76 | HR 88 | Temp 97.6°F | Resp 16 | Ht 68.0 in | Wt 190.8 lb

## 2015-02-01 DIAGNOSIS — Z0000 Encounter for general adult medical examination without abnormal findings: Secondary | ICD-10-CM | POA: Diagnosis not present

## 2015-02-01 LAB — CBC WITH DIFFERENTIAL/PLATELET
BASOS ABS: 0 10*3/uL (ref 0.0–0.1)
Basophils Relative: 0.1 % (ref 0.0–3.0)
Eosinophils Absolute: 0.1 10*3/uL (ref 0.0–0.7)
Eosinophils Relative: 2.5 % (ref 0.0–5.0)
HCT: 42.8 % (ref 39.0–52.0)
Hemoglobin: 14.4 g/dL (ref 13.0–17.0)
LYMPHS ABS: 1.5 10*3/uL (ref 0.7–4.0)
Lymphocytes Relative: 29.5 % (ref 12.0–46.0)
MCHC: 33.5 g/dL (ref 30.0–36.0)
MCV: 85.9 fl (ref 78.0–100.0)
MONOS PCT: 7.4 % (ref 3.0–12.0)
Monocytes Absolute: 0.4 10*3/uL (ref 0.1–1.0)
NEUTROS PCT: 60.5 % (ref 43.0–77.0)
Neutro Abs: 3 10*3/uL (ref 1.4–7.7)
Platelets: 134 10*3/uL — ABNORMAL LOW (ref 150.0–400.0)
RBC: 4.98 Mil/uL (ref 4.22–5.81)
RDW: 14.1 % (ref 11.5–15.5)
WBC: 4.9 10*3/uL (ref 4.0–10.5)

## 2015-02-01 LAB — URINALYSIS, ROUTINE W REFLEX MICROSCOPIC
BILIRUBIN URINE: NEGATIVE
Hgb urine dipstick: NEGATIVE
Ketones, ur: NEGATIVE
LEUKOCYTES UA: NEGATIVE
Nitrite: NEGATIVE
PH: 6 (ref 5.0–8.0)
RBC / HPF: NONE SEEN (ref 0–?)
Specific Gravity, Urine: 1.01 (ref 1.000–1.030)
Total Protein, Urine: NEGATIVE
Urine Glucose: NEGATIVE
Urobilinogen, UA: 0.2 (ref 0.0–1.0)

## 2015-02-01 LAB — LIPID PANEL
Cholesterol: 161 mg/dL (ref 0–200)
HDL: 38.2 mg/dL — AB (ref >39.00)
LDL Cholesterol: 98 mg/dL (ref 0–99)
NONHDL: 122.9
Total CHOL/HDL Ratio: 4
Triglycerides: 123 mg/dL (ref 0.0–149.0)
VLDL: 24.6 mg/dL (ref 0.0–40.0)

## 2015-02-01 LAB — HEPATIC FUNCTION PANEL
ALK PHOS: 51 U/L (ref 39–117)
ALT: 22 U/L (ref 0–53)
AST: 19 U/L (ref 0–37)
Albumin: 4.6 g/dL (ref 3.5–5.2)
BILIRUBIN TOTAL: 0.5 mg/dL (ref 0.2–1.2)
Bilirubin, Direct: 0.1 mg/dL (ref 0.0–0.3)
TOTAL PROTEIN: 7.4 g/dL (ref 6.0–8.3)

## 2015-02-01 LAB — BASIC METABOLIC PANEL
BUN: 22 mg/dL (ref 6–23)
CHLORIDE: 104 meq/L (ref 96–112)
CO2: 28 meq/L (ref 19–32)
Calcium: 9.5 mg/dL (ref 8.4–10.5)
Creatinine, Ser: 0.92 mg/dL (ref 0.40–1.50)
GFR: 89.03 mL/min (ref >60.00)
GLUCOSE: 100 mg/dL — AB (ref 70–99)
Potassium: 4.3 mEq/L (ref 3.5–5.1)
Sodium: 140 mEq/L (ref 135–145)

## 2015-02-01 LAB — TSH: TSH: 1.24 u[IU]/mL (ref 0.35–4.50)

## 2015-02-01 MED ORDER — PRAVASTATIN SODIUM 40 MG PO TABS: 40.0000 mg | ORAL_TABLET | Freq: Every day | ORAL | Status: DC

## 2015-02-01 MED ORDER — OMEPRAZOLE 40 MG PO CPDR: 40.0000 mg | DELAYED_RELEASE_CAPSULE | Freq: Every day | ORAL | Status: DC

## 2015-02-01 MED ORDER — METOPROLOL SUCCINATE ER 25 MG PO TB24
25.0000 mg | ORAL_TABLET | Freq: Every day | ORAL | Status: DC
Start: 2015-02-01 — End: 2015-08-04

## 2015-02-01 MED ORDER — AMLODIPINE BESYLATE 10 MG PO TABS: 10.0000 mg | ORAL_TABLET | Freq: Every day | ORAL | Status: DC

## 2015-02-01 MED ORDER — LOSARTAN POTASSIUM-HCTZ 100-12.5 MG PO TABS: 1.0000 | ORAL_TABLET | Freq: Every day | ORAL | Status: DC

## 2015-02-01 MED ORDER — TAMSULOSIN HCL 0.4 MG PO CAPS: 0.4000 mg | ORAL_CAPSULE | Freq: Every day | ORAL | Status: DC

## 2015-02-01 NOTE — Patient Instructions (Signed)
Please complete lab work prior to leaving. Keep up the good work with diet/exercise/weight loss!

## 2015-02-01 NOTE — Progress Notes (Signed)
Subjective:    Patient ID: Aaron Ruiz, male    DOB: 1955-11-05, 59 y.o.   MRN: [id number]  HPI  Mr.  [name] is a 59 yr old male who presents today for cpx.  Immunizations- up to date.  Diet:  Healthy Exercise:  Treadmill/walking, 4-5 days a week. Colo: April, no polyps. Dental:  Up to date Eye:  Up to date Wt Readings from Last 3 Encounters:  02/01/15 190 lb 12.8 oz (86.546 kg)  10/27/14 199 lb (90.266 kg)  10/18/14 199 lb (90.266 kg)     Review of Systems  Constitutional: Negative for unexpected weight change.  HENT: Negative for rhinorrhea.        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  Respiratory: Negative for cough and shortness of breath.   Cardiovascular: Negative for chest pain.  Gastrointestinal: Negative for diarrhea, constipation and blood in stool.  Genitourinary:       Voiding well on flomax.   Musculoskeletal: Negative for myalgias and arthralgias.  Skin: Negative for rash.  Neurological: Negative for headaches.  Hematological: Negative for adenopathy.  Psychiatric/Behavioral:       Denies depression/anxiety   Past Medical History  Diagnosis Date  . Anemia   . Asthma     uses inhaler  . GERD (gastroesophageal reflux disease)     on meds  . Hyperlipidemia   . Hypertension   . Diverticulosis   . Colon polyps   . Hyperglycemia 05/01/2011  . Borderline diabetes 05/03/2011  . Lactose intolerance   . Hx of adenomatous colonic polyps 03/07/2009    History   Social History  . Marital Status: Married    Spouse Name: N/A  . Number of Children: N/A  . Years of Education: N/A   Occupational History  . Not on file.   Social History Main Topics  . Smoking status: Former Smoker    Types: Cigarettes  . Smokeless tobacco: Never Used  . Alcohol Use: No  . Drug Use: No  . Sexual Activity: Not on file   Other Topics Concern  . 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 Inguinal hernia repair  1995    left  . Cystectomy  05/2004    removed fromback /sebaceous cyst  . Nasal septum surgery      Family History  Problem Relation Age of Onset  . Coronary artery disease Father     CABG X5  . Diabetes Father   . Hypertension    . Kidney disease    . Colon cancer Maternal Aunt   . Cancer Brother     lung    Allergies  Allergen Reactions  . Sulfonamide Derivatives     REACTION: Hallucination    Current Outpatient Prescriptions on File Prior to Visit  Medication Sig Dispense Refill  . acetaminophen (TYLENOL) 500 MG tablet Take 500 mg by mouth every 6 (six) hours as needed.    [name] amLODipine (NORVASC) 10 MG tablet TAKE 1 TABLET BY MOUTH DAILY 30 tablet 3  . aspirin 81 MG  tablet Take 81 mg by mouth daily.      . cetirizine (ZYRTEC) 10 MG tablet Take 10 mg by mouth daily.      . fish oil-omega-3 fatty acids 1000 MG capsule Take 1,400 mg by mouth daily.    [name] losartan-hydrochlorothiazide (HYZAAR) 100-12.5 MG per tablet TAKE 1 TABLET BY MOUTH DAILY 30 tablet 2  . metoprolol succinate (TOPROL-XL) 25 MG 24 hr tablet TAKE 1 TABLET BY MOUTH DAILY 30 tablet 2  . omeprazole (PRILOSEC) 40 MG capsule TAKE ONE CAPSULE BY MOUTH DAILY 30 capsule 3  . pravastatin (PRAVACHOL) 40 MG tablet TAKE 1 TABLET BY MOUTH DAILY 30 tablet 2  . PROAIR HFA 108 (90 BASE) MCG/ACT inhaler INHALE 2 PUFFS INTO THE LUNGS EVERY 6 HOURS AS NEEDED 8.5 g 2  . tamsulosin (FLOMAX) 0.4 MG CAPS capsule TAKE 1 CAPSULE BY MOUTH DAILY 30 capsule 2   No current facility-administered medications on file prior to visit.    BP 124/76 mmHg  Pulse 88  Temp(Src) 97.6 F (36.4 C) (Oral)  Resp 16  Ht 5\' 8"  (1.727 m)   Wt 190 lb 12.8 oz (86.546 kg)  BMI 29.02 kg/m2  SpO2 98%        Objective:   Physical Exam  Physical Exam  Constitutional: He is oriented to person, place, and time. He appears well-developed and well-nourished. No distress.  HENT:  Head: Normocephalic and atraumatic.  Right Ear: Tympanic membrane and ear canal normal.  Left Ear: Tympanic membrane and ear canal normal.  Mouth/Throat: Oropharynx is clear and moist.  Eyes: Pupils are equal, round, and reactive to light. No scleral icterus.  Neck: Normal range of motion. No thyromegaly present.  Cardiovascular: Normal rate and regular rhythm.   No murmur heard. Pulmonary/Chest: Effort normal and breath sounds normal. No respiratory distress. He has no wheezes. He has no rales. He exhibits no tenderness.  Abdominal: Soft. Bowel sounds are normal. He exhibits no distension and no mass. There is no tenderness. There is no rebound and no guarding.  Musculoskeletal: He exhibits no edema.  Lymphadenopathy:    He has no cervical adenopathy.  Neurological: He is alert and oriented to person, place, and time. He has normal patellar reflexes. He exhibits normal muscle tone. Coordination normal.  Skin: Skin is warm and dry.  Psychiatric: He has a normal mood and affect. His behavior is normal. Judgment and thought content normal.          Assessment & Plan:         Assessment & Plan:  EKG tracing is personally reviewed.  EKG notes NSR.  No acute changes.

## 2015-02-01 NOTE — Progress Notes (Signed)
Pre visit review using our clinic review tool, if applicable. No additional management support is needed unless otherwise documented below in the visit note. 

## 2015-02-02 NOTE — Assessment & Plan Note (Signed)
Immunizations reviewed and up to date.  Continue healthy diet, exercise.  Obtain routine labs.  Commended pt on his recent weight loss.

## 2015-07-02 DIAGNOSIS — C4491 Basal cell carcinoma of skin, unspecified: Secondary | ICD-10-CM

## 2015-07-02 HISTORY — PX: OTHER SURGICAL HISTORY: SHX169

## 2015-07-02 HISTORY — DX: Basal cell carcinoma of skin, unspecified: C44.91

## 2015-08-04 ENCOUNTER — Encounter: Payer: Self-pay | Admitting: Family

## 2015-08-04 ENCOUNTER — Ambulatory Visit (INDEPENDENT_AMBULATORY_CARE_PROVIDER_SITE_OTHER): Payer: Managed Care, Other (non HMO) | Admitting: Family

## 2015-08-04 VITALS — BP 138/62 | HR 63 | Temp 98.9°F | Resp 16 | Ht 68.0 in | Wt 193.8 lb

## 2015-08-04 DIAGNOSIS — R739 Hyperglycemia, unspecified: Secondary | ICD-10-CM | POA: Diagnosis not present

## 2015-08-04 DIAGNOSIS — E785 Hyperlipidemia, unspecified: Secondary | ICD-10-CM

## 2015-08-04 DIAGNOSIS — I10 Essential (primary) hypertension: Secondary | ICD-10-CM

## 2015-08-04 DIAGNOSIS — K219 Gastro-esophageal reflux disease without esophagitis: Secondary | ICD-10-CM

## 2015-08-04 DIAGNOSIS — R7303 Prediabetes: Secondary | ICD-10-CM

## 2015-08-04 DIAGNOSIS — N400 Enlarged prostate without lower urinary tract symptoms: Secondary | ICD-10-CM

## 2015-08-04 DIAGNOSIS — L989 Disorder of the skin and subcutaneous tissue, unspecified: Secondary | ICD-10-CM

## 2015-08-04 LAB — HEMOGLOBIN A1C: Hgb A1c MFr Bld: 5.6 % (ref 4.6–6.5)

## 2015-08-04 LAB — LIPID PANEL
CHOL/HDL RATIO: 4
Cholesterol: 137 mg/dL (ref 0–200)
HDL: 33 mg/dL — AB (ref >39.00)
LDL CALC: 74 mg/dL (ref 0–99)
NONHDL: 104.49
TRIGLYCERIDES: 151 mg/dL — AB (ref 0.0–149.0)
VLDL: 30.2 mg/dL (ref 0.0–40.0)

## 2015-08-04 LAB — BASIC METABOLIC PANEL
BUN: 25 mg/dL — AB (ref 6–23)
CO2: 27 meq/L (ref 19–32)
Calcium: 9.6 mg/dL (ref 8.4–10.5)
Chloride: 107 mEq/L (ref 96–112)
Creatinine, Ser: 0.85 mg/dL (ref 0.40–1.50)
GFR: 97.38 mL/min (ref >60.00)
GLUCOSE: 103 mg/dL — AB (ref 70–99)
POTASSIUM: 4.9 meq/L (ref 3.5–5.1)
SODIUM: 142 meq/L (ref 135–145)

## 2015-08-04 MED ORDER — LOSARTAN POTASSIUM-HCTZ 100-12.5 MG PO TABS: 1.0000 | ORAL_TABLET | Freq: Every day | ORAL | Status: DC

## 2015-08-04 MED ORDER — PRAVASTATIN SODIUM 40 MG PO TABS: 40.0000 mg | ORAL_TABLET | Freq: Every day | ORAL | Status: DC

## 2015-08-04 MED ORDER — AMLODIPINE BESYLATE 10 MG PO TABS: 10.0000 mg | ORAL_TABLET | Freq: Every day | ORAL | Status: DC

## 2015-08-04 MED ORDER — METOPROLOL SUCCINATE ER 25 MG PO TB24: 25.0000 mg | ORAL_TABLET | Freq: Every day | ORAL | Status: DC

## 2015-08-04 MED ORDER — TAMSULOSIN HCL 0.4 MG PO CAPS: 0.4000 mg | ORAL_CAPSULE | Freq: Every day | ORAL | Status: DC

## 2015-08-04 MED ORDER — OMEPRAZOLE 40 MG PO CPDR: 40.0000 mg | DELAYED_RELEASE_CAPSULE | Freq: Every day | ORAL | Status: DC

## 2015-08-04 NOTE — Assessment & Plan Note (Signed)
Clinically stable, obtain a1c.  

## 2015-08-04 NOTE — Assessment & Plan Note (Signed)
Advised pt on trial off of PPI, if recurrent gerd sxs ok to resume.

## 2015-08-04 NOTE — Progress Notes (Signed)
Pre visit review using our clinic review tool, if applicable. No additional management support is needed unless otherwise documented below in the visit note. 

## 2015-08-04 NOTE — Patient Instructions (Signed)
Please complete lab work prior to leaving. Try stopping prilosec to see if you feel ok off of medication.  If reflux symptoms return, ok to restart prilosec.  You will be contacted about your referral to dermatology- please let me know if you have not heard back in 1 week. Follow up in 3 months.

## 2015-08-04 NOTE — Assessment & Plan Note (Signed)
Continue pravachol, obtain flp.

## 2015-08-04 NOTE — Assessment & Plan Note (Signed)
BP stable. Obtain bmet, continue current meds.

## 2015-08-04 NOTE — Assessment & Plan Note (Signed)
Stable, continue flomax.

## 2015-08-04 NOTE — Progress Notes (Signed)
Subjective:    Patient ID: Aaron Ruiz, male    DOB: [birth year], 60 y.o.   MRN: [id number]  HPI  Mr. Aaron Ruiz is a 60 yr old male who presents today for follow up.  1) Hyperlipidemia- maintained on pravachol.  Lab Results  Component Value Date   CHOL 161 02/01/2015   HDL 38.20* 02/01/2015   LDLCALC 98 02/01/2015   TRIG 123.0 02/01/2015   CHOLHDL 4 02/01/2015   2) HTN- maintained on amlodipine, toprol xl, hyzaar. BP Readings from Last 3 Encounters:  08/04/15 138/62  02/01/15 124/76  10/27/14 111/69   3) Borderline DM2- Reports good compliance with diet Lab Results  Component Value Date   HGBA1C 5.9 07/29/2014   HGBA1C 5.8* 01/05/2014   HGBA1C 6.0* 07/30/2013   Lab Results  Component Value Date   MICROALBUR 0.50 01/05/2014   LDLCALC 98 02/01/2015   CREATININE 0.92 02/01/2015   4) Skin lesion- on nose, has been present for "years"  5) BPH- Maintained on flomax.  Lab Results  Component Value Date   PSA 0.94 07/29/2014   PSA 1.18 11/25/2012   PSA 0.85 11/14/2010   6) GERD- maintained on prilosec.  Reports that he has never tried coming off prilosce.      Review of Systems  Respiratory: Negative for shortness of breath.   Cardiovascular: Negative for chest pain and leg swelling.  Genitourinary: Negative for difficulty urinating.     Past Medical History  Diagnosis Date  . Anemia   . Asthma     uses inhaler  . GERD (gastroesophageal reflux disease)     on meds  . Hyperlipidemia   . Hypertension   . Diverticulosis   . Colon polyps   . Hyperglycemia 05/01/2011  . Borderline diabetes 05/03/2011  . Lactose intolerance   . Hx of adenomatous colonic polyps 03/07/2009    Social History   Social History  . Marital Status: Married    Spouse Name: N/A  . Number of Children: N/A  . Years of Education: N/A   Occupational History  . Not on file.   Social History Main Topics  . Smoking status: Former Smoker    Types: Cigarettes  . Smokeless  tobacco: Never Used  . Alcohol Use: No  . Drug Use: No  . Sexual Activity: Not on file   Other Topics Concern  . 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 Inguinal hernia repair  1995    left  . Cystectomy  05/2004    removed fromback /sebaceous cyst  . Nasal septum surgery      Family History  Problem Relation Age of Onset  . Coronary artery disease Father     CABG X5  . Diabetes Father   . Hypertension    . Kidney disease    . Colon cancer Maternal Aunt   . Cancer Brother     lung    Allergies  Allergen Reactions  . Sulfonamide Derivatives     REACTION: Hallucination    Current Outpatient Prescriptions on File Prior to Visit  Medication Sig  Dispense Refill  . acetaminophen (TYLENOL) 500 MG tablet Take 500 mg by mouth every 6 (six) hours as needed.    [name] amLODipine (NORVASC) 10 MG tablet Take 1 tablet (10 mg total) by mouth daily. 30 tablet 5  . aspirin 81 MG tablet Take 81 mg by mouth daily.      . cetirizine (ZYRTEC) 10 MG tablet Take 10 mg by mouth daily.      . fish oil-omega-3 fatty acids 1000 MG capsule Take 1,400 mg by mouth daily.    [name] losartan-hydrochlorothiazide (HYZAAR) 100-12.5 MG per tablet Take 1 tablet by mouth daily. 30 tablet 5  . metoprolol succinate (TOPROL-XL) 25 MG 24 hr tablet Take 1 tablet (25 mg total) by mouth daily. 30 tablet 5  . omeprazole (PRILOSEC) 40 MG capsule Take 1 capsule (40 mg total) by mouth daily. 30 capsule 5  . pravastatin (PRAVACHOL) 40 MG tablet Take 1 tablet (40 mg total) by mouth daily. 30 tablet 5  . PROAIR HFA 108 (90 BASE) MCG/ACT inhaler INHALE 2 PUFFS INTO THE LUNGS EVERY 6 HOURS AS NEEDED 8.5 g 2  .  tamsulosin (FLOMAX) 0.4 MG CAPS capsule Take 1 capsule (0.4 mg total) by mouth daily. 30 capsule 5   No current facility-administered medications on file prior to visit.    BP 138/62 mmHg  Pulse 63  Temp(Src) 98.9 F (37.2 C) (Oral)  Resp 16  Ht 5\' 8"  (1.727 m)  Wt 193 lb 12.8 oz (87.907 kg)  BMI 29.47 kg/m2  SpO2 97%       Objective:   Physical Exam  Constitutional: He is oriented to person, place, and time. He appears well-developed and well-nourished. No distress.  HENT:  Head: Normocephalic and atraumatic.  Cardiovascular: Normal rate and regular rhythm.   No murmur heard. Pulmonary/Chest: Effort normal and breath sounds normal. No respiratory distress. He has no wheezes. He has no rales.  Musculoskeletal: He exhibits no edema.  Neurological: He is alert and oriented to person, place, and time.  Skin: Skin is warm and dry.  Small irregular hyperpigmented lesion left side of nose  Psychiatric: He has a normal mood and affect. His behavior is normal. Thought content normal.          Assessment & Plan:

## 2015-10-16 ENCOUNTER — Ambulatory Visit (INDEPENDENT_AMBULATORY_CARE_PROVIDER_SITE_OTHER): Payer: Managed Care, Other (non HMO) | Admitting: Family

## 2015-10-16 ENCOUNTER — Encounter: Payer: Self-pay | Admitting: Family

## 2015-10-16 VITALS — BP 136/57 | HR 70 | Temp 99.0°F | Resp 16 | Ht 68.0 in | Wt 198.6 lb

## 2015-10-16 DIAGNOSIS — L259 Unspecified contact dermatitis, unspecified cause: Secondary | ICD-10-CM | POA: Diagnosis not present

## 2015-10-16 MED ORDER — PREDNISONE 20 MG PO TABS: ORAL_TABLET | ORAL | Status: DC

## 2015-10-16 NOTE — Patient Instructions (Signed)
Please start prednisone. Continue zyrtec and as needed benadryl. Call if rash/itching worsens or if it does not improve.

## 2015-10-16 NOTE — Progress Notes (Signed)
Subjective:    Patient ID: Aaron Ruiz, male    DOB: [birth year], 60 y.o.   MRN: [id number]  HPI   Aaron Ruiz is a 60 yr old male who presents today for follow up from his urgent care visit for poison oak 10/02/15.  Pt was given a steroid shot and prednisone.  Notes that initially his symptoms improved until the middle of last week when he began to itch again. Initially had rash on arms, face.    Now back legs, chest are breaking out. Benadryl helps some with his symptoms but makes him sleepy.  He continues zyrtec.      Review of Systems See HPI  Past Medical History  Diagnosis Date  . Anemia   . Asthma     uses inhaler  . GERD (gastroesophageal reflux disease)     on meds  . Hyperlipidemia   . Hypertension   . Diverticulosis   . Colon polyps   . Hyperglycemia 05/01/2011  . Borderline diabetes 05/03/2011  . Lactose intolerance   . Hx of adenomatous colonic polyps 03/07/2009     Social History   Social History  . Marital Status: Married    Spouse Name: N/A  . Number of Children: N/A  . Years of Education: N/A   Occupational History  . Not on file.   Social History Main Topics  . Smoking status: Former Smoker    Types: Cigarettes  . Smokeless tobacco: Never Used  . Alcohol Use: No  . Drug Use: No  . Sexual Activity: Not on file   Other Topics Concern  . 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 Inguinal hernia repair  1995    left  . Cystectomy  05/2004    removed fromback /sebaceous cyst  . Nasal septum surgery      Family History  Problem Relation Age of Onset  . Coronary artery disease Father     CABG X5  . Diabetes Father   .  Hypertension    . Kidney disease    . Colon cancer Maternal Aunt   . Cancer Brother     lung    Allergies  Allergen Reactions  . Sulfonamide Derivatives     REACTION: Hallucination    Current Outpatient Prescriptions on File Prior to Visit  Medication Sig Dispense Refill  . amLODipine (NORVASC) 10 MG tablet Take 1 tablet (10 mg total) by mouth daily. 30 tablet 5  . aspirin 81 MG tablet Take 81 mg by mouth daily.      . cetirizine (ZYRTEC) 10 MG tablet Take 10 mg by mouth daily.      . fish oil-omega-3 fatty acids 1000 MG capsule Take 1,400 mg by mouth daily.    [name] losartan-hydrochlorothiazide (HYZAAR) 100-12.5 MG tablet Take 1 tablet by mouth daily. 30 tablet 5  . metoprolol succinate (TOPROL-XL) 25 MG 24 hr tablet Take 1 tablet (25 mg total) by mouth daily. 30 tablet 5  . omeprazole (PRILOSEC) 40 MG capsule Take 1 capsule (40 mg total) by mouth daily. 30 capsule 5  .  pravastatin (PRAVACHOL) 40 MG tablet Take 1 tablet (40 mg total) by mouth daily. 30 tablet 5  . tamsulosin (FLOMAX) 0.4 MG CAPS capsule Take 1 capsule (0.4 mg total) by mouth daily. 30 capsule 5  . acetaminophen (TYLENOL) 500 MG tablet Take 500 mg by mouth every 6 (six) hours as needed.    [name] PROAIR HFA 108 (90 BASE) MCG/ACT inhaler INHALE 2 PUFFS INTO THE LUNGS EVERY 6 HOURS AS NEEDED (Patient not taking: Reported on 10/16/2015) 8.5 g 2   No current facility-administered medications on file prior to visit.    BP 136/57 mmHg  Pulse 70  Temp(Src) 99 F (37.2 C) (Oral)  Resp 16  Ht 5\' 8"  (1.727 m)  Wt 198 lb 9.6 oz (90.084 kg)  BMI 30.20 kg/m2  SpO2 99%       Objective:   Physical Exam  Constitutional: He is oriented to person, place, and time. He appears well-developed and well-nourished. No distress.  Neurological: He is alert and oriented to person, place, and time.  Skin:  + erythematous rash noted on back, abdomen and legs           Assessment & Plan:  Contact dermatitis- seems to have had some  rebound in his rash after discontinuation of steroids. Will rx with a short burst of prednisone. Continue zyrtec, prn benadryl. When PO steroids complete, advised pt to transition to topical steroid cream (has one from urgent care).  He is advised to call if symptoms worsen or do not improve. Pt verbalizes understanding.

## 2015-10-16 NOTE — Progress Notes (Signed)
Pre visit review using our clinic review tool, if applicable. No additional management support is needed unless otherwise documented below in the visit note. 

## 2015-11-01 ENCOUNTER — Ambulatory Visit: Payer: Managed Care, Other (non HMO) | Admitting: Family

## 2015-12-25 ENCOUNTER — Ambulatory Visit (INDEPENDENT_AMBULATORY_CARE_PROVIDER_SITE_OTHER): Payer: Managed Care, Other (non HMO) | Admitting: Family

## 2015-12-25 ENCOUNTER — Encounter: Payer: Self-pay | Admitting: Family

## 2015-12-25 VITALS — BP 118/60 | HR 67 | Temp 98.2°F | Resp 16 | Ht 68.0 in | Wt 195.6 lb

## 2015-12-25 DIAGNOSIS — Z0000 Encounter for general adult medical examination without abnormal findings: Secondary | ICD-10-CM

## 2015-12-25 DIAGNOSIS — R739 Hyperglycemia, unspecified: Secondary | ICD-10-CM | POA: Diagnosis not present

## 2015-12-25 DIAGNOSIS — Z1159 Encounter for screening for other viral diseases: Secondary | ICD-10-CM

## 2015-12-25 LAB — CBC WITH DIFFERENTIAL/PLATELET
Basophils Absolute: 0 10*3/uL (ref 0.0–0.1)
Basophils Relative: 0.3 % (ref 0.0–3.0)
EOS ABS: 0.2 10*3/uL (ref 0.0–0.7)
Eosinophils Relative: 4 % (ref 0.0–5.0)
HEMATOCRIT: 40.4 % (ref 39.0–52.0)
HEMOGLOBIN: 13.6 g/dL (ref 13.0–17.0)
LYMPHS PCT: 31.7 % (ref 12.0–46.0)
Lymphs Abs: 1.4 10*3/uL (ref 0.7–4.0)
MCHC: 33.6 g/dL (ref 30.0–36.0)
MCV: 84.2 fl (ref 78.0–100.0)
Monocytes Absolute: 0.4 10*3/uL (ref 0.1–1.0)
Monocytes Relative: 9 % (ref 3.0–12.0)
NEUTROS ABS: 2.4 10*3/uL (ref 1.4–7.7)
Neutrophils Relative %: 55 % (ref 43.0–77.0)
PLATELETS: 141 10*3/uL — AB (ref 150.0–400.0)
RBC: 4.8 Mil/uL (ref 4.22–5.81)
RDW: 13.6 % (ref 11.5–15.5)
WBC: 4.4 10*3/uL (ref 4.0–10.5)

## 2015-12-25 LAB — HEMOGLOBIN A1C: Hgb A1c MFr Bld: 5.8 % (ref 4.6–6.5)

## 2015-12-25 LAB — URINALYSIS, ROUTINE W REFLEX MICROSCOPIC
Bilirubin Urine: NEGATIVE
Hgb urine dipstick: NEGATIVE
Ketones, ur: NEGATIVE
Leukocytes, UA: NEGATIVE
Nitrite: NEGATIVE
PH: 6 (ref 5.0–8.0)
RBC / HPF: NONE SEEN (ref 0–?)
Specific Gravity, Urine: 1.015 (ref 1.000–1.030)
TOTAL PROTEIN, URINE-UPE24: NEGATIVE
UROBILINOGEN UA: 0.2 (ref 0.0–1.0)
Urine Glucose: NEGATIVE
WBC, UA: NONE SEEN (ref 0–?)

## 2015-12-25 LAB — LIPID PANEL
CHOL/HDL RATIO: 4
Cholesterol: 132 mg/dL (ref 0–200)
HDL: 33.4 mg/dL — AB (ref >39.00)
LDL Cholesterol: 78 mg/dL (ref 0–99)
NONHDL: 98.25
Triglycerides: 101 mg/dL (ref 0.0–149.0)
VLDL: 20.2 mg/dL (ref 0.0–40.0)

## 2015-12-25 LAB — BASIC METABOLIC PANEL
BUN: 15 mg/dL (ref 6–23)
CALCIUM: 9.1 mg/dL (ref 8.4–10.5)
CO2: 26 mEq/L (ref 19–32)
CREATININE: 0.83 mg/dL (ref 0.40–1.50)
Chloride: 107 mEq/L (ref 96–112)
GFR: 99.96 mL/min (ref >60.00)
Glucose, Bld: 104 mg/dL — ABNORMAL HIGH (ref 70–99)
Potassium: 4 mEq/L (ref 3.5–5.1)
Sodium: 141 mEq/L (ref 135–145)

## 2015-12-25 LAB — HEPATIC FUNCTION PANEL
ALK PHOS: 50 U/L (ref 39–117)
ALT: 14 U/L (ref 0–53)
AST: 13 U/L (ref 0–37)
Albumin: 4.5 g/dL (ref 3.5–5.2)
BILIRUBIN DIRECT: 0 mg/dL (ref 0.0–0.3)
TOTAL PROTEIN: 6.9 g/dL (ref 6.0–8.3)
Total Bilirubin: 0.4 mg/dL (ref 0.2–1.2)

## 2015-12-25 LAB — TSH: TSH: 1.57 u[IU]/mL (ref 0.35–4.50)

## 2015-12-25 MED ORDER — ALBUTEROL SULFATE HFA 108 (90 BASE) MCG/ACT IN AERS: INHALATION_SPRAY | RESPIRATORY_TRACT | Status: DC

## 2015-12-25 NOTE — Patient Instructions (Signed)
Please complete lab work prior to leaving. Continue healthy diet, exercise and weight loss efforts.

## 2015-12-25 NOTE — Progress Notes (Signed)
Pre visit review using our clinic review tool, if applicable. No additional management support is needed unless otherwise documented below in the visit note. 

## 2015-12-25 NOTE — Assessment & Plan Note (Signed)
Discussed healthy diet, exercise and weight loss.  Obtain routine lab work. EKG tracing is personally reviewed.  EKG notes NSR.  No acute changes.  Immunizations reviewed and up to date.

## 2015-12-25 NOTE — Progress Notes (Signed)
Subjective:    Patient ID: Aaron Ruiz, male    DOB: 08-17-1955, 60 y.o.   MRN: [id number]  HPI   Patient presents today for complete physical.  Immunizations: tetanus/shingles vaccine up to date Diet: healthy Exercise: 30-45 walk 3 + days a week Colonoscopy: 4/16  Wt Readings from Last 3 Encounters:  12/25/15 195 lb 9.6 oz (88.724 kg)  10/16/15 198 lb 9.6 oz (90.084 kg)  08/04/15 193 lb 12.8 oz (87.907 kg)      Review of Systems  Constitutional: Negative for unexpected weight change.  HENT: Negative for rhinorrhea.        Some hearing loss left ear, declines referral to audiology  Eyes: Negative for visual disturbance.  Respiratory: Negative for cough.   Cardiovascular: Negative for leg swelling.  Gastrointestinal: Negative for diarrhea, constipation and blood in stool.  Endocrine: Positive for cold intolerance.  Genitourinary: Negative for dysuria and frequency.  Musculoskeletal: Negative for myalgias and arthralgias.  Skin: Negative for rash.  Neurological: Negative for headaches.  Hematological: Negative for adenopathy.  Psychiatric/Behavioral:       Denies depression/anxiety   Past Medical History  Diagnosis Date  . Anemia   . Asthma     uses inhaler  . GERD (gastroesophageal reflux disease)     on meds  . Hyperlipidemia   . Hypertension   . Diverticulosis   . Colon polyps   . Hyperglycemia 05/01/2011  . Borderline diabetes 05/03/2011  . Lactose intolerance   . Hx of adenomatous colonic polyps 03/07/2009  . Basal cell carcinoma 2017    left side of nose     Social History   Social History  . Marital Status: Married    Spouse Name: N/A  . Number of Children: N/A  . Years of Education: N/A   Occupational History  . Not on file.   Social History Main Topics  . Smoking status: Former Smoker    Types: Cigarettes  . Smokeless tobacco: Never Used  . Alcohol Use: No  . Drug Use: No  . Sexual Activity: Not on file   Other Topics Concern  .  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 Inguinal hernia repair  1995    left  . Cystectomy  05/2004    removed fromback /sebaceous cyst  . Nasal septum surgery    . Mohs procedure  2017    left side of nose    Family History  Problem Relation Age of Onset  . Coronary artery disease Father     CABG X5  . Diabetes Father   . Hypertension    . Kidney disease    . Colon cancer Maternal Aunt   . Cancer Brother     lung    Allergies  Allergen Reactions  . Sulfonamide Derivatives     REACTION: Hallucination    Current Outpatient Prescriptions on File Prior to Visit  Medication Sig Dispense Refill  . acetaminophen (TYLENOL) 500 MG tablet Take 500 mg by mouth every 6 (six) hours as needed.    [name] amLODipine (NORVASC) 10 MG  tablet Take 1 tablet (10 mg total) by mouth daily. 30 tablet 5  . aspirin 81 MG tablet Take 81 mg by mouth daily.      . cetirizine (ZYRTEC) 10 MG tablet Take 10 mg by mouth daily.      . fish oil-omega-3 fatty acids 1000 MG capsule Take 1,400 mg by mouth daily.    [name] losartan-hydrochlorothiazide (HYZAAR) 100-12.5 MG tablet Take 1 tablet by mouth daily. 30 tablet 5  . metoprolol succinate (TOPROL-XL) 25 MG 24 hr tablet Take 1 tablet (25 mg total) by mouth daily. 30 tablet 5  . omeprazole (PRILOSEC) 40 MG capsule Take 1 capsule (40 mg total) by mouth daily. 30 capsule 5  . pravastatin (PRAVACHOL) 40 MG tablet Take 1 tablet (40 mg total) by mouth daily. 30 tablet 5  . tamsulosin (FLOMAX) 0.4 MG CAPS capsule Take 1 capsule (0.4 mg total) by mouth daily. 30 capsule 5   No current facility-administered medications on file prior to visit.    BP 118/60  mmHg  Pulse 67  Temp(Src) 98.2 F (36.8 C) (Oral)  Resp 16  Ht 5\' 8"  (1.727 m)  Wt 195 lb 9.6 oz (88.724 kg)  BMI 29.75 kg/m2  SpO2 97%       Objective:   Physical Exam  Physical Exam  Constitutional: He is oriented to person, place, and time. He appears well-developed and well-nourished. No distress.  HENT:  Head: Normocephalic and atraumatic.  Right Ear: Tympanic membrane and ear canal normal.  Left Ear: Tympanic membrane and ear canal normal.  Mouth/Throat: Oropharynx is clear and moist.  Eyes: Pupils are equal, round, and reactive to light. No scleral icterus.  Neck: Normal range of motion. No thyromegaly present.  Cardiovascular: Normal rate and regular rhythm.   No murmur heard. Pulmonary/Chest: Effort normal and breath sounds normal. No respiratory distress. He has no wheezes. He has no rales. He exhibits no tenderness.  Abdominal: Soft. Bowel sounds are normal. He exhibits no distension and no mass. There is no tenderness. There is no rebound and no guarding.  Musculoskeletal: He exhibits no edema.  Lymphadenopathy:    He has no cervical adenopathy.  Neurological: He is alert and oriented to person, place, and time. He has normal patellar reflexes. He exhibits normal muscle tone. Coordination normal.  Skin: Skin is warm and dry.  Psychiatric: He has a normal mood and affect. His behavior is normal. Judgment and thought content normal.          Assessment & Plan:         Assessment & Plan:

## 2015-12-26 LAB — HEPATITIS C ANTIBODY: HCV AB: NEGATIVE

## 2015-12-27 ENCOUNTER — Encounter: Payer: Self-pay | Admitting: Family

## 2016-01-30 ENCOUNTER — Other Ambulatory Visit: Payer: Self-pay | Admitting: Family

## 2016-03-28 ENCOUNTER — Ambulatory Visit (INDEPENDENT_AMBULATORY_CARE_PROVIDER_SITE_OTHER): Payer: Managed Care, Other (non HMO) | Admitting: Family Medicine

## 2016-03-28 ENCOUNTER — Encounter: Payer: Self-pay | Admitting: Family Medicine

## 2016-03-28 VITALS — BP 100/50 | HR 79 | Temp 98.2°F | Ht 68.0 in | Wt 197.8 lb

## 2016-03-28 DIAGNOSIS — R350 Frequency of micturition: Secondary | ICD-10-CM | POA: Diagnosis not present

## 2016-03-28 DIAGNOSIS — J020 Streptococcal pharyngitis: Secondary | ICD-10-CM

## 2016-03-28 LAB — POC URINALSYSI DIPSTICK (AUTOMATED)
Bilirubin, UA: NEGATIVE
Blood, UA: NEGATIVE
GLUCOSE UA: NEGATIVE
Ketones, UA: NEGATIVE
Leukocytes, UA: NEGATIVE
NITRITE UA: NEGATIVE
Protein, UA: NEGATIVE
Spec Grav, UA: >=1.03
Urobilinogen, UA: 0.2
pH, UA: 5.5

## 2016-03-28 LAB — POCT RAPID STREP A (OFFICE): Rapid Strep A Screen: POSITIVE — AB

## 2016-03-28 MED ORDER — AMOXICILLIN 500 MG PO CAPS: 500.0000 mg | ORAL_CAPSULE | Freq: Three times a day (TID) | ORAL | 0 refills | Status: DC

## 2016-03-28 NOTE — Progress Notes (Signed)
Pre visit review using our clinic review tool, if applicable. No additional management support is needed unless otherwise documented below in the visit note. 

## 2016-03-28 NOTE — Patient Instructions (Addendum)
New toothbrush after 24 hours of being on antibiotic. Strep Throat Strep throat is a bacterial infection of the throat. Your health care provider may call the infection tonsillitis or pharyngitis, depending on whether there is swelling in the tonsils or at the back of the throat. Strep throat is most common during the cold months of the year in children who are 32-61 years of age, but it can happen during any season in people of any age. This infection is spread from person to person (contagious) through coughing, sneezing, or close contact. CAUSES Strep throat is caused by the bacteria called Streptococcus pyogenes. RISK FACTORS This condition is more likely to develop in:  People who spend time in crowded places where the infection can spread easily.  People who have close contact with someone who has strep throat. SYMPTOMS Symptoms of this condition include:  Fever or chills.   Redness, swelling, or pain in the tonsils or throat.  Pain or difficulty when swallowing.  White or yellow spots on the tonsils or throat.  Swollen, tender glands in the neck or under the jaw.  Red rash all over the body (rare). DIAGNOSIS This condition is diagnosed by performing a rapid strep test or by taking a swab of your throat (throat culture test). Results from a rapid strep test are usually ready in a few minutes, but throat culture test results are available after one or two days. TREATMENT This condition is treated with antibiotic medicine. HOME CARE INSTRUCTIONS Medicines  Take over-the-counter and prescription medicines only as told by your health care provider.  Take your antibiotic as told by your health care provider. Do not stop taking the antibiotic even if you start to feel better.  Have family members who also have a sore throat or fever tested for strep throat. They may need antibiotics if they have the strep infection. Eating and Drinking  Do not share food, drinking cups, or  personal items that could cause the infection to spread to other people.  If swallowing is difficult, try eating soft foods until your sore throat feels better.  Drink enough fluid to keep your urine clear or pale yellow. General Instructions  Gargle with a salt-water mixture 3-4 times per day or as needed. To make a salt-water mixture, completely dissolve -1 tsp of salt in 1 cup of warm water.  Make sure that all household members wash their hands well.  Get plenty of rest.  Stay home from school or work until you have been taking antibiotics for 24 hours.  Keep all follow-up visits as told by your health care provider. This is important. SEEK MEDICAL CARE IF:  The glands in your neck continue to get bigger.  You develop a rash, cough, or earache.  You cough up a thick liquid that is green, yellow-brown, or bloody.  You have pain or discomfort that does not get better with medicine.  Your problems seem to be getting worse rather than better.  You have a fever. SEEK IMMEDIATE MEDICAL CARE IF:  You have new symptoms, such as vomiting, severe headache, stiff or painful neck, chest pain, or shortness of breath.  You have severe throat pain, drooling, or changes in your voice.  You have swelling of the neck, or the skin on the neck becomes red and tender.  You have signs of dehydration, such as fatigue, dry mouth, and decreased urination.  You become increasingly sleepy, or you cannot wake up completely.  Your joints become red or painful.  This information is not intended to replace advice given to you by your health care provider. Make sure you discuss any questions you have with your health care provider.   Document Released: 06/14/2000 Document Revised: 03/08/2015 Document Reviewed: 10/10/2014 Elsevier Interactive Patient Education [employer].

## 2016-03-28 NOTE — Progress Notes (Signed)
SUBJECTIVE:   Aaron Ruiz is a 60 y.o. male presents to the clinic for:  Chief Complaint  Patient presents with  . Fever    and st-started on yest  . Urinary Frequency    this weekend    Complains of sore throat for 1 day.  Other associated symptoms: fever- 102.2 F.  Denies: sinus congestion, rhinorrhea, ear pain, ear drainage and cough Sick Contacts: none known Therapy to date: none  Increased urinary frequency over weekend. Mild pain. Hx of BPH, but S/S's not the same. No blood or back pain, BM's have been normal. This issue has resolved, but he has had hx of UTI's in the past.  History  Smoking Status  . Former Smoker  . Types: Cigarettes  Smokeless Tobacco  . Never Used    ROS: Pertinent items are noted in HPI  Patient's medications, allergies, past medical, surgical, social and family histories were reviewed and updated as appropriate.  OBJECTIVE:  BP (!) 100/50 (BP Location: Left Arm, Patient Position: Sitting, Cuff Size: Large)   Pulse 79   Temp 98.2 F (36.8 C) (Oral)   Ht 5\' 8"  (1.727 m)   Wt 197 lb 12.8 oz (89.7 kg)   SpO2 98%   BMI 30.08 kg/m  General: Awake, alert, appearing stated age Eyes: conjunctivae and sclerae clear Ears: normal TMs bilaterally Nose: no visible exudate Oropharynx: pharynx erythematous, no exudates appreciated Neck: supple, no significant adenopathy Lungs: clear to auscultation, no wheezes, rales or rhonchi, symmetric air entry, normal effort Heart: rate and rhythm regular Abd: BS+, soft, NT, ND Skin:reveals no rash Psych: Age appropriate judgment and insight  ASSESSMENT/PLAN:  Strep pharyngitis - Plan: POCT rapid strep A, amoxicillin (AMOXIL) 500 MG capsule  Frequent urination - Plan: POCT Urinalysis Dipstick (Automated)  Orders as above. Strep positive. New toothbrush after 24 hrs on abx. Frequent urinary issue resolved, f/u prn. F/u prn. Pt voiced understanding and agreement to the plan.  Hallam,  DO 03/28/16 9:13 AM

## 2016-06-06 ENCOUNTER — Encounter: Payer: Self-pay | Admitting: Family

## 2016-06-07 ENCOUNTER — Telehealth: Payer: Managed Care, Other (non HMO) | Admitting: Nurse Practitioner

## 2016-06-07 DIAGNOSIS — L247 Irritant contact dermatitis due to plants, except food: Secondary | ICD-10-CM

## 2016-06-07 MED ORDER — PREDNISONE 10 MG (21) PO TBPK: ORAL_TABLET | ORAL | 0 refills | Status: DC

## 2016-06-07 NOTE — Progress Notes (Signed)

## 2016-06-17 ENCOUNTER — Encounter: Payer: Self-pay | Admitting: Family

## 2016-06-17 ENCOUNTER — Ambulatory Visit (INDEPENDENT_AMBULATORY_CARE_PROVIDER_SITE_OTHER): Payer: Managed Care, Other (non HMO) | Admitting: Family

## 2016-06-17 VITALS — BP 140/66 | HR 72 | Temp 98.3°F | Resp 18 | Ht 68.0 in | Wt 205.0 lb

## 2016-06-17 DIAGNOSIS — M25551 Pain in right hip: Secondary | ICD-10-CM

## 2016-06-17 DIAGNOSIS — L237 Allergic contact dermatitis due to plants, except food: Secondary | ICD-10-CM

## 2016-06-17 MED ORDER — PRAVASTATIN SODIUM 40 MG PO TABS: ORAL_TABLET | ORAL | 5 refills | Status: DC

## 2016-06-17 MED ORDER — OMEPRAZOLE 40 MG PO CPDR: DELAYED_RELEASE_CAPSULE | ORAL | 5 refills | Status: DC

## 2016-06-17 MED ORDER — TAMSULOSIN HCL 0.4 MG PO CAPS: ORAL_CAPSULE | ORAL | 5 refills | Status: DC

## 2016-06-17 MED ORDER — METOPROLOL SUCCINATE ER 25 MG PO TB24: ORAL_TABLET | ORAL | 5 refills | Status: DC

## 2016-06-17 MED ORDER — PREDNISONE 10 MG PO TABS: ORAL_TABLET | ORAL | 0 refills | Status: DC

## 2016-06-17 MED ORDER — AMLODIPINE BESYLATE 10 MG PO TABS: ORAL_TABLET | ORAL | 5 refills | Status: DC

## 2016-06-17 MED ORDER — LOSARTAN POTASSIUM-HCTZ 100-12.5 MG PO TABS: 1.0000 | ORAL_TABLET | Freq: Every day | ORAL | 5 refills | Status: DC

## 2016-06-17 NOTE — Patient Instructions (Addendum)
Please restart prednisone. Call if symptoms worsen or if symptoms do not resolve by the time you complete the prednisone.

## 2016-06-17 NOTE — Progress Notes (Signed)
Subjective:    Patient ID: Aaron Ruiz, male    DOB: 11-Jun-1956, 60 y.o.   MRN: [id number]  HPI  [name] is a 60 yr old male who presents today with chief complaint of pruritic rash on arms/legs.  He did an E-visit on 12/8 and was treated with a 6 day course of prednisone. Notes that his symptoms resolved while on the prednisone, but restarted after he completed course. Using benadryl prn. Reports that he has done some recent yard work and symptoms are similar to previous poison ivy rashes he has had.  R hip hip pain- began this AM.  Notes that it "hurts to raise my right leg." pain has improved since this AM   Review of Systems See HPI  Past Medical History:  Diagnosis Date  . Anemia   . Asthma    uses inhaler  . Basal cell carcinoma 2017   left side of nose  . Borderline diabetes 05/03/2011  . Colon polyps   . Diverticulosis   . GERD (gastroesophageal reflux disease)    on meds  . Hx of adenomatous colonic polyps 03/07/2009  . Hyperglycemia 05/01/2011  . Hyperlipidemia   . Hypertension   . Lactose intolerance      Social History   Social History  . Marital status: Married    Spouse name: N/A  . Number of children: N/A  . Years of education: N/A   Occupational History  . Not on file.   Social History Main Topics  . Smoking status: Former Smoker    Types: Cigarettes  . Smokeless tobacco: Never Used  . Alcohol use No  . Drug use: No  . Sexual activity: Not on file   Other Topics Concern  . 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 CYSTECTOMY  05/2004   removed fromback /sebaceous cyst  . [place]   left  . mohs  procedure  2017   left side of nose  . NASAL SEPTUM SURGERY      Family History  Problem Relation Age of Onset  . Coronary artery disease Father     CABG X5  . Diabetes Father   . Hypertension    . Kidney disease    . Colon cancer Maternal Aunt   . Cancer Brother     lung    Allergies  Allergen Reactions  . Sulfonamide Derivatives     REACTION: Hallucination    Current Outpatient Prescriptions on File Prior to Visit  Medication Sig Dispense Refill  . acetaminophen (TYLENOL) 500 MG tablet Take 500 mg by mouth every 6 (six) hours as needed.    [name] albuterol (PROAIR HFA) 108 (90 Base) MCG/ACT inhaler INHALE 2 PUFFS INTO THE LUNGS EVERY 6 HOURS AS NEEDED 8.5 g 2  . amLODipine (NORVASC) 10 MG tablet TAKE 1 TABLET(10 MG) BY MOUTH DAILY 30 tablet 5  . aspirin 81 MG tablet Take 81 mg by mouth daily.      . cetirizine (ZYRTEC) 10 MG tablet Take 10 mg by mouth daily.      [name]  fish oil-omega-3 fatty acids 1000 MG capsule Take 1,400 mg by mouth daily.    [name] losartan-hydrochlorothiazide (HYZAAR) 100-12.5 MG tablet TAKE 1 TABLET BY MOUTH DAILY 30 tablet 5  . metoprolol succinate (TOPROL-XL) 25 MG 24 hr tablet TAKE 1 TABLET(25 MG) BY MOUTH DAILY 30 tablet 5  . omeprazole (PRILOSEC) 40 MG capsule TAKE 1 CAPSULE(40 MG) BY MOUTH DAILY 30 capsule 5  . pravastatin (PRAVACHOL) 40 MG tablet TAKE 1 TABLET(40 MG) BY MOUTH DAILY 30 tablet 5  . tamsulosin (FLOMAX) 0.4 MG CAPS capsule TAKE 1 CAPSULE(0.4 MG) BY MOUTH DAILY 30 capsule 5   No current facility-administered medications on file prior to visit.     BP 140/66 (BP Location: Right Arm, Cuff Size: Large)   Pulse 72   Temp 98.3 F (36.8 C) (Oral)   Resp 18   Ht 5\' 8"  (1.727 m)   Wt 205 lb (93 kg)   SpO2 99% Comment: room air  BMI 31.17 kg/m       Objective:   Physical Exam  Constitutional: He is oriented to person, place, and time. He appears well-developed and well-nourished. No distress.  HENT:  Head: Normocephalic and atraumatic.    Cardiovascular: Normal rate.   Musculoskeletal: He exhibits no edema.  + right hip pain with extension.   Neurological: He is alert and oriented to person, place, and time.  Skin: Skin is warm and dry.  Excoriated rash noted on bilateral shins and bilateral forearms   Psychiatric: He has a normal mood and affect. His behavior is normal. Thought content normal.          Assessment & Plan:  R hip pain- should improve with steroid taper, if not, consider ortho referral.  Poison ivy dermatitis- rx with longer steroid taper. Pt is advised to call if new/worsening symptoms or if symptoms do not improve.

## 2016-06-17 NOTE — Addendum Note (Signed)
Addended by: Kelle Darting A on: 06/17/2016 06:36 PM   Modules accepted: Orders

## 2016-06-17 NOTE — Progress Notes (Signed)
Pre visit review using our clinic review tool, if applicable. No additional management support is needed unless otherwise documented below in the visit note. 

## 2016-07-01 DIAGNOSIS — Z9889 Other specified postprocedural states: Secondary | ICD-10-CM | POA: Insufficient documentation

## 2016-07-01 HISTORY — DX: Other specified postprocedural states: Z98.890

## 2016-07-03 ENCOUNTER — Encounter: Payer: Self-pay | Admitting: Family

## 2016-07-03 DIAGNOSIS — R21 Rash and other nonspecific skin eruption: Secondary | ICD-10-CM

## 2016-07-03 MED ORDER — BETAMETHASONE DIPROPIONATE 0.05 % EX CREA: TOPICAL_CREAM | Freq: Two times a day (BID) | CUTANEOUS | 0 refills | Status: DC

## 2016-11-22 ENCOUNTER — Ambulatory Visit (INDEPENDENT_AMBULATORY_CARE_PROVIDER_SITE_OTHER): Payer: Managed Care, Other (non HMO) | Admitting: Family

## 2016-11-22 ENCOUNTER — Encounter: Payer: Self-pay | Admitting: Family

## 2016-11-22 VITALS — BP 131/68 | HR 72 | Temp 98.4°F | Resp 18 | Ht 66.75 in | Wt 199.8 lb

## 2016-11-22 DIAGNOSIS — Z0000 Encounter for general adult medical examination without abnormal findings: Secondary | ICD-10-CM

## 2016-11-22 LAB — LIPID PANEL
Cholesterol: 147 mg/dL (ref 0–200)
HDL: 32 mg/dL — ABNORMAL LOW (ref >39.00)
LDL CALC: 88 mg/dL (ref 0–99)
NONHDL: 115.16
Total CHOL/HDL Ratio: 5
Triglycerides: 136 mg/dL (ref 0.0–149.0)
VLDL: 27.2 mg/dL (ref 0.0–40.0)

## 2016-11-22 LAB — HEPATIC FUNCTION PANEL
ALK PHOS: 51 U/L (ref 39–117)
ALT: 18 U/L (ref 0–53)
AST: 18 U/L (ref 0–37)
Albumin: 4.6 g/dL (ref 3.5–5.2)
BILIRUBIN DIRECT: 0.1 mg/dL (ref 0.0–0.3)
BILIRUBIN TOTAL: 0.3 mg/dL (ref 0.2–1.2)
Total Protein: 7 g/dL (ref 6.0–8.3)

## 2016-11-22 LAB — URINALYSIS, ROUTINE W REFLEX MICROSCOPIC
Bilirubin Urine: NEGATIVE
HGB URINE DIPSTICK: NEGATIVE
KETONES UR: NEGATIVE
Leukocytes, UA: NEGATIVE
Nitrite: NEGATIVE
Specific Gravity, Urine: 1.01 (ref 1.000–1.030)
Total Protein, Urine: NEGATIVE
URINE GLUCOSE: NEGATIVE
Urobilinogen, UA: 0.2 (ref 0.0–1.0)
WBC, UA: NONE SEEN — AB (ref 0–?)
pH: 5.5 (ref 5.0–8.0)

## 2016-11-22 LAB — CBC WITH DIFFERENTIAL/PLATELET
BASOS ABS: 0 10*3/uL (ref 0.0–0.1)
Basophils Relative: 0.2 % (ref 0.0–3.0)
Eosinophils Absolute: 0.2 10*3/uL (ref 0.0–0.7)
Eosinophils Relative: 4.4 % (ref 0.0–5.0)
HCT: 42.5 % (ref 39.0–52.0)
Hemoglobin: 14 g/dL (ref 13.0–17.0)
LYMPHS ABS: 1.5 10*3/uL (ref 0.7–4.0)
Lymphocytes Relative: 31.2 % (ref 12.0–46.0)
MCHC: 33 g/dL (ref 30.0–36.0)
MCV: 85.8 fl (ref 78.0–100.0)
MONO ABS: 0.4 10*3/uL (ref 0.1–1.0)
Monocytes Relative: 8.6 % (ref 3.0–12.0)
Neutro Abs: 2.7 10*3/uL (ref 1.4–7.7)
Neutrophils Relative %: 55.6 % (ref 43.0–77.0)
Platelets: 143 10*3/uL — ABNORMAL LOW (ref 150.0–400.0)
RBC: 4.95 Mil/uL (ref 4.22–5.81)
RDW: 14.4 % (ref 11.5–15.5)
WBC: 4.9 10*3/uL (ref 4.0–10.5)

## 2016-11-22 LAB — BASIC METABOLIC PANEL
BUN: 16 mg/dL (ref 6–23)
CALCIUM: 9.3 mg/dL (ref 8.4–10.5)
CO2: 28 mEq/L (ref 19–32)
Chloride: 104 mEq/L (ref 96–112)
Creatinine, Ser: 0.82 mg/dL (ref 0.40–1.50)
GFR: 101.06 mL/min (ref >60.00)
GLUCOSE: 100 mg/dL — AB (ref 70–99)
Potassium: 4.1 mEq/L (ref 3.5–5.1)
SODIUM: 138 meq/L (ref 135–145)

## 2016-11-22 LAB — TSH: TSH: 1.79 u[IU]/mL (ref 0.35–4.50)

## 2016-11-22 LAB — PSA: PSA: 0.84 ng/mL (ref 0.10–4.00)

## 2016-11-22 NOTE — Patient Instructions (Addendum)
Please call your insurance to see if Shingrix is covered.  If so, please schedule a nurse visit and we will give you a vaccine.  Continue the good work with healthy diet, exercise and weight loss.  Complete lab work prior to leaving.

## 2016-11-22 NOTE — Progress Notes (Signed)
Subjective:    Patient ID: Aaron Ruiz, male    DOB: Feb 03, 1956, 61 y.o.   MRN: [id number]  HPI  Mr. Humbarger is a 61 yr old male who presents today for complete physical.  Patient presents today for complete physical.  Immunizations: tetanus up to date.  Diet: eating healthier Exercise: + regular exercise, fast walk on incline on treadmill 3 x a week 30-45 minutes Colonoscopy: 10/27/14 PSA:   Lab Results  Component Value Date   PSA 0.94 07/29/2014   PSA 1.18 11/25/2012   PSA 0.85 11/14/2010   Wt Readings from Last 3 Encounters:  11/22/16 199 lb 12.8 oz (90.6 kg)  06/17/16 205 lb (93 kg)  03/28/16 197 lb 12.8 oz (89.7 kg)       Review of Systems  Constitutional: Negative for unexpected weight change.  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 Some seasonal allergies  Eyes: Negative for visual disturbance.  Respiratory: Negative for cough and shortness of breath.   Cardiovascular: Negative for chest pain and leg swelling.  Gastrointestinal: Negative for blood in stool, constipation and diarrhea.  Genitourinary: Negative for dysuria and frequency.  Musculoskeletal: Negative for arthralgias and myalgias.  Neurological: Negative for headaches.  Hematological: Negative for adenopathy.  Psychiatric/Behavioral:       Denies depression/anxiety        Past Medical History:  Diagnosis Date  . Anemia   . Asthma    uses inhaler  . Basal cell carcinoma 2017   left side of nose  . Borderline diabetes 05/03/2011  . Colon polyps   . Diverticulosis   . GERD (gastroesophageal reflux disease)    on meds  . Hx of adenomatous colonic polyps 03/07/2009  . Hyperglycemia 05/01/2011  . Hyperlipidemia   . Hypertension   . Lactose intolerance      Social History   Social History  . Marital status: Married    Spouse name: N/A  . Number of children: N/A  . Years of education: N/A   Occupational History  . Not on file.   Social History  Main Topics  . Smoking status: Former Smoker    Types: Cigarettes  . Smokeless tobacco: Never Used  . Alcohol use No  . Drug use: No  . Sexual activity: Not on file   Other Topics Concern  . 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 CYSTECTOMY  05/2004   removed fromback /sebaceous cyst  . [place]   left  . mohs procedure  2017   left side of nose  . NASAL SEPTUM SURGERY      Family History  Problem Relation Age of Onset  . Coronary artery disease Father        CABG X5  . Diabetes Father   . Colon cancer Maternal Aunt   . Cancer Brother        lung  . Hypertension Unknown   . Kidney disease Unknown     Allergies  Allergen Reactions  . Sulfonamide Derivatives  REACTION: Hallucination    Current Outpatient Prescriptions on File Prior to Visit  Medication Sig Dispense Refill  . acetaminophen (TYLENOL) 500 MG tablet Take 500 mg by mouth every 6 (six) hours as needed.    [name] albuterol (PROAIR HFA) 108 (90 Base) MCG/ACT inhaler INHALE 2 PUFFS INTO THE LUNGS EVERY 6 HOURS AS NEEDED 8.5 g 2  . amLODipine (NORVASC) 10 MG tablet TAKE 1 TABLET(10 MG) BY MOUTH DAILY 30 tablet 5  . aspirin 81 MG tablet Take 81 mg by mouth daily.      . cetirizine (ZYRTEC) 10 MG tablet Take 10 mg by mouth daily.      . fish oil-omega-3 fatty acids 1000 MG capsule Take 1,400 mg by mouth daily.    [name] losartan-hydrochlorothiazide (HYZAAR) 100-12.5 MG tablet Take 1 tablet by mouth daily. 30 tablet 5  . metoprolol succinate (TOPROL-XL) 25 MG 24 hr tablet TAKE 1 TABLET(25 MG) BY MOUTH DAILY 30 tablet 5  . omeprazole (PRILOSEC) 40 MG capsule TAKE 1 CAPSULE(40 MG) BY  MOUTH DAILY 30 capsule 5  . pravastatin (PRAVACHOL) 40 MG tablet TAKE 1 TABLET(40 MG) BY MOUTH DAILY 30 tablet 5  . tamsulosin (FLOMAX) 0.4 MG CAPS capsule TAKE 1 CAPSULE(0.4 MG) BY MOUTH DAILY 30 capsule 5   No current facility-administered medications on file prior to visit.     BP 131/68 (BP Location: Left Arm, Cuff Size: Large)   Pulse 72   Temp 98.4 F (36.9 C) (Oral)   Resp 18   Ht 5' 6.75" (1.695 m)   Wt 199 lb 12.8 oz (90.6 kg)   SpO2 97% Comment: ROOM AIR  BMI 31.53 kg/m    Objective:   Physical Exam  Physical Exam  Constitutional: He is oriented to person, place, and time. He appears well-developed and well-nourished. No distress.  HENT:  Head: Normocephalic and atraumatic.  Right Ear: Tympanic membrane and ear canal normal.  Left Ear: Tympanic membrane and ear canal normal.  Mouth/Throat: Oropharynx is clear and moist.  Eyes: Pupils are equal, round, and reactive to light. No scleral icterus.  Neck: Normal range of motion. No thyromegaly present.  Cardiovascular: Normal rate and regular rhythm.   No murmur heard. Pulmonary/Chest: Effort normal and breath sounds normal. No respiratory distress. He has no wheezes. He has no rales. He exhibits no tenderness.  Abdominal: Soft. Bowel sounds are normal. He exhibits no distension and no mass. There is no tenderness. There is no rebound and no guarding.  Musculoskeletal: He exhibits no edema.  Lymphadenopathy:    He has no cervical adenopathy.  Neurological: He is alert and oriented to person, place, and time. He has normal patellar reflexes. He exhibits normal muscle tone. Coordination normal.  Skin: Skin is warm and dry.  Psychiatric: He has a normal mood and affect. His behavior is normal. Judgment and thought content normal.           Assessment & Plan:   Preventative Care- discussed healthy diet, exercise and weight loss.  Obtain routine lab work.  He wants to include PSA in his lab work.  EKG tracing is  personally reviewed.  EKG notes NSR.  No acute changes.  Discussed healthy diet, exercise, weight loss.        Assessment & Plan:

## 2016-12-24 ENCOUNTER — Other Ambulatory Visit: Payer: Self-pay | Admitting: Family

## 2017-01-15 DIAGNOSIS — H11423 Conjunctival edema, bilateral: Secondary | ICD-10-CM

## 2017-01-15 HISTORY — DX: Conjunctival edema, bilateral: H11.423

## 2017-06-19 ENCOUNTER — Other Ambulatory Visit: Payer: Self-pay | Admitting: Family

## 2017-06-19 MED ORDER — ACETAMINOPHEN 500 MG PO TABS: 500.0000 mg | ORAL_TABLET | Freq: Four times a day (QID) | ORAL | 5 refills | Status: AC | PRN

## 2017-06-19 NOTE — Telephone Encounter (Signed)
Yes please, he is due for routine follow up.

## 2017-12-05 ENCOUNTER — Ambulatory Visit (INDEPENDENT_AMBULATORY_CARE_PROVIDER_SITE_OTHER): Payer: Managed Care, Other (non HMO) | Admitting: Family

## 2017-12-05 ENCOUNTER — Encounter: Payer: Self-pay | Admitting: Family

## 2017-12-05 VITALS — BP 127/62 | HR 67 | Temp 98.2°F | Resp 16 | Ht 67.0 in | Wt 205.2 lb

## 2017-12-05 DIAGNOSIS — Z125 Encounter for screening for malignant neoplasm of prostate: Secondary | ICD-10-CM

## 2017-12-05 DIAGNOSIS — Z0000 Encounter for general adult medical examination without abnormal findings: Secondary | ICD-10-CM

## 2017-12-05 LAB — LIPID PANEL
CHOLESTEROL: 164 mg/dL (ref 0–200)
HDL: 32.8 mg/dL — AB (ref >39.00)
LDL CALC: 96 mg/dL (ref 0–99)
NonHDL: 130.97
TRIGLYCERIDES: 174 mg/dL — AB (ref 0.0–149.0)
Total CHOL/HDL Ratio: 5
VLDL: 34.8 mg/dL (ref 0.0–40.0)

## 2017-12-05 LAB — CBC WITH DIFFERENTIAL/PLATELET
Basophils Absolute: 0 10*3/uL (ref 0.0–0.1)
Basophils Relative: 0.5 % (ref 0.0–3.0)
EOS ABS: 0.1 10*3/uL (ref 0.0–0.7)
Eosinophils Relative: 2.2 % (ref 0.0–5.0)
HCT: 42.5 % (ref 39.0–52.0)
Hemoglobin: 14.2 g/dL (ref 13.0–17.0)
LYMPHS ABS: 1.9 10*3/uL (ref 0.7–4.0)
Lymphocytes Relative: 36 % (ref 12.0–46.0)
MCHC: 33.4 g/dL (ref 30.0–36.0)
MCV: 88 fl (ref 78.0–100.0)
MONO ABS: 0.5 10*3/uL (ref 0.1–1.0)
Monocytes Relative: 9.7 % (ref 3.0–12.0)
NEUTROS ABS: 2.7 10*3/uL (ref 1.4–7.7)
Neutrophils Relative %: 51.6 % (ref 43.0–77.0)
PLATELETS: 154 10*3/uL (ref 150.0–400.0)
RBC: 4.83 Mil/uL (ref 4.22–5.81)
RDW: 14.8 % (ref 11.5–15.5)
WBC: 5.2 10*3/uL (ref 4.0–10.5)

## 2017-12-05 LAB — URINALYSIS, ROUTINE W REFLEX MICROSCOPIC
Bilirubin Urine: NEGATIVE
HGB URINE DIPSTICK: NEGATIVE
KETONES UR: NEGATIVE
Leukocytes, UA: NEGATIVE
NITRITE: NEGATIVE
RBC / HPF: NONE SEEN (ref 0–?)
Total Protein, Urine: NEGATIVE
UROBILINOGEN UA: 0.2 (ref 0.0–1.0)
Urine Glucose: NEGATIVE
pH: 5.5 (ref 5.0–8.0)

## 2017-12-05 LAB — HEPATIC FUNCTION PANEL
ALT: 23 U/L (ref 0–53)
AST: 20 U/L (ref 0–37)
Albumin: 4.7 g/dL (ref 3.5–5.2)
Alkaline Phosphatase: 48 U/L (ref 39–117)
BILIRUBIN DIRECT: 0.1 mg/dL (ref 0.0–0.3)
BILIRUBIN TOTAL: 0.6 mg/dL (ref 0.2–1.2)
TOTAL PROTEIN: 7.1 g/dL (ref 6.0–8.3)

## 2017-12-05 LAB — BASIC METABOLIC PANEL
BUN: 15 mg/dL (ref 6–23)
CHLORIDE: 102 meq/L (ref 96–112)
CO2: 27 meq/L (ref 19–32)
CREATININE: 0.82 mg/dL (ref 0.40–1.50)
Calcium: 9.4 mg/dL (ref 8.4–10.5)
GFR: 100.73 mL/min (ref >60.00)
GLUCOSE: 99 mg/dL (ref 70–99)
Potassium: 4.4 mEq/L (ref 3.5–5.1)
Sodium: 139 mEq/L (ref 135–145)

## 2017-12-05 LAB — PSA: PSA: 0.84 ng/mL (ref 0.10–4.00)

## 2017-12-05 LAB — TSH: TSH: 1.52 u[IU]/mL (ref 0.35–4.50)

## 2017-12-05 MED ORDER — ALBUTEROL SULFATE 108 (90 BASE) MCG/ACT IN AEPB: 2.0000 | INHALATION_SPRAY | Freq: Four times a day (QID) | RESPIRATORY_TRACT | 2 refills | Status: DC | PRN

## 2017-12-05 NOTE — Progress Notes (Signed)
Subjective:    Patient ID: Aaron Ruiz, male    DOB: 03/15/1956, 62 y.o.   MRN: [id number]  HPI   Patient presents today for complete physical.  Immunizations: declines pneumovax, Tdap 2011 Diet: Exercise: tries to exercise 3 times a week, walks on treadmill Colonoscopy: 2016 Dental:  Due  Vision: up to date, wears contacts Wt Readings from Last 3 Encounters:  12/05/17 205 lb 3.2 oz (93.1 kg)  11/22/16 199 lb 12.8 oz (90.6 kg)  06/17/16 205 lb (93 kg)         Review of Systems  HENT: Negative for rhinorrhea.        Hearing loss,  Left ear, declines audiology  Eyes: Negative for visual disturbance.  Respiratory: Negative for cough and shortness of breath.   Cardiovascular: Negative for chest pain and leg swelling.  Gastrointestinal: Negative for blood in stool, constipation and diarrhea.  Genitourinary: Negative for dysuria and frequency.  Musculoskeletal: Negative for back pain and gait problem.  Skin: Negative for rash.  Neurological: Negative for headaches.  Hematological: Negative for adenopathy.  Psychiatric/Behavioral:       Denies depression/anxiety       Past Medical History:  Diagnosis Date  . Anemia   . Asthma    uses inhaler  . Basal cell carcinoma 2017   left side of nose  . Borderline diabetes 05/03/2011  . Colon polyps   . Diverticulosis   . GERD (gastroesophageal reflux disease)    on meds  . Hx of adenomatous colonic polyps 03/07/2009  . Hx of eye surgery 2018   "Left Eye was swollen and had procedure to remove excess swelling"  . Hyperglycemia 05/01/2011  . Hyperlipidemia   . Hypertension   . Lactose intolerance      Social History   Socioeconomic History  . Marital status: Married    Spouse name: Not on file  . Number of children: Not on file  . Years of education: Not on file  . Highest education level: Not on file  Occupational History  . Not on file  Social Needs  . Financial resource strain: Not on file  . Food  insecurity:    Worry: Not on file    Inability: Not on file  . Transportation needs:    Medical: Not on file    Non-medical: Not on file  Tobacco Use  . Smoking status: Former Smoker    Types: Cigarettes  . Smokeless tobacco: Never Used  Substance and Sexual Activity  . Alcohol use: No    Alcohol/week: 0.0 oz  . Drug use: No  . Sexual activity: Not on file  Lifestyle  . Physical activity:    Days per week: Not on file    Minutes per session: Not on file  . Stress: Not on file  Relationships  . Social connections:    Talks on phone: Not on file    Gets together: Not on file    Attends religious service: Not on file    Active member of club or organization: Not on file    Attends meetings of clubs or organizations: Not on file    Relationship status: Not on file  . Intimate partner violence:    Fear of current or ex partner: Not on file    Emotionally abused: Not on file    Physically abused: Not on file    Forced sexual activity: Not on file  Other Topics Concern  . 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 CYSTECTOMY  05/2004   removed fromback /sebaceous cyst  . [place]   left  . mohs procedure  2017   left side of nose  . NASAL SEPTUM SURGERY      Family History  Problem Relation Age of Onset  . Coronary artery disease Father        CABG X5  . Diabetes Father   . Colon cancer Maternal Aunt   . Cancer Brother        lung (smoker)  . Hypertension Unknown   . Kidney disease Unknown     Allergies  Allergen Reactions  . Sulfonamide Derivatives     REACTION: Hallucination    Current Outpatient Medications on File Prior to  Visit  Medication Sig Dispense Refill  . acetaminophen (TYLENOL) 500 MG tablet Take 1 tablet (500 mg total) by mouth every 6 (six) hours as needed. 30 tablet 5  . albuterol (PROAIR HFA) 108 (90 Base) MCG/ACT inhaler INHALE 2 PUFFS INTO THE LUNGS EVERY 6 HOURS AS NEEDED 8.5 g 2  . amLODipine (NORVASC) 10 MG tablet TAKE 1 TABLET(10 MG) BY MOUTH DAILY 30 tablet 5  . aspirin 81 MG tablet Take 81 mg by mouth daily.      . cetirizine (ZYRTEC) 10 MG tablet Take 10 mg by mouth daily.      . fish oil-omega-3 fatty acids 1000 MG capsule Take 1,400 mg by mouth daily.    [name] guaiFENesin (MUCINEX) 600 MG 12 hr tablet Take 600 mg by mouth 2 (two) times daily as needed.    [name] losartan-hydrochlorothiazide (HYZAAR) 100-12.5 MG tablet TAKE 1 TABLET BY MOUTH DAILY 30 tablet 5  . metoprolol succinate (TOPROL-XL) 25 MG 24 hr tablet TAKE 1 TABLET(25 MG) BY MOUTH DAILY 30 tablet 5  . omeprazole (PRILOSEC) 40 MG capsule TAKE 1 CAPSULE(40 MG) BY MOUTH DAILY 30 capsule 5  . pravastatin (PRAVACHOL) 40 MG tablet TAKE 1 TABLET(40 MG) BY MOUTH DAILY 30 tablet 5  . tamsulosin (FLOMAX) 0.4 MG CAPS capsule TAKE 1 CAPSULE(0.4 MG) BY MOUTH DAILY 30 capsule 5   No current facility-administered medications on file prior to visit.     BP 127/62 (BP Location: Right Arm, Cuff Size: Large)   Pulse 67   Temp 98.2 F (36.8 C) (Oral)   Resp 16   Ht 5\' 7"  (1.702 m)   Wt 205 lb 3.2 oz (93.1 kg)   SpO2 98%   BMI 32.14 kg/m    Objective:   Physical Exam  Physical Exam  Constitutional: He is oriented to person, place, and time. He appears well-developed and well-nourished. No distress.  HENT:  Head: Normocephalic and atraumatic.  Right Ear: Tympanic membrane and ear canal normal.  Left Ear: Tympanic membrane and ear canal normal.  Mouth/Throat: Oropharynx is clear and moist.  Eyes: Pupils are equal, round, and reactive to light. No scleral icterus.  Neck: Normal range of motion. No thyromegaly present.  Cardiovascular: Normal  rate and regular rhythm.   No murmur heard. Pulmonary/Chest: Effort normal and breath sounds normal.  No respiratory distress. He has no wheezes. He has no rales. He exhibits no tenderness.  Abdominal: Soft. Bowel sounds are normal. He exhibits no distension and no mass. There is no tenderness. There is no rebound and no guarding.  Musculoskeletal: He exhibits no edema.  Lymphadenopathy:    He has no cervical adenopathy.  Neurological: He is alert and oriented to person, place, and time. He has normal patellar reflexes. He exhibits normal muscle tone. Coordination normal.  Skin: Skin is warm and dry.  Psychiatric: He has a normal mood and affect. His behavior is normal. Judgment and thought content normal.           Assessment & Plan:   Preventative care- discussed diet/exercise/weight loss. [place] up to date. Candidate up to date on tetanus.  Unfortunately shingrix is on Psychologist, prison and probation services.  Obtain routine lab work.       Assessment & Plan:  EKG tracing is personally reviewed.  EKG notes NSR.  No acute changes.

## 2017-12-05 NOTE — Patient Instructions (Signed)
Please complete lab work prior to leaving. Continue to work on healthy diet, exercise and weight loss.  

## 2017-12-06 ENCOUNTER — Encounter: Payer: Self-pay | Admitting: Family

## 2017-12-16 ENCOUNTER — Other Ambulatory Visit: Payer: Self-pay | Admitting: Family

## 2018-04-08 ENCOUNTER — Encounter: Payer: Self-pay | Admitting: Family

## 2018-04-10 ENCOUNTER — Encounter: Payer: Self-pay | Admitting: Family

## 2018-04-10 ENCOUNTER — Ambulatory Visit (INDEPENDENT_AMBULATORY_CARE_PROVIDER_SITE_OTHER): Payer: Managed Care, Other (non HMO) | Admitting: Family

## 2018-04-10 VITALS — BP 136/65 | HR 76 | Temp 98.1°F | Resp 16 | Ht 67.0 in | Wt 211.8 lb

## 2018-04-10 DIAGNOSIS — M722 Plantar fascial fibromatosis: Secondary | ICD-10-CM | POA: Diagnosis not present

## 2018-04-10 MED ORDER — MELOXICAM 7.5 MG PO TABS: 7.5000 mg | ORAL_TABLET | Freq: Every day | ORAL | 0 refills | Status: DC

## 2018-04-10 NOTE — Progress Notes (Signed)
Subjective:    Patient ID: Aaron Ruiz, male    DOB: 1955/12/07, 63 y.o.   MRN: [id number]  HPI  Patient is a 62 yr old male who presents today with c/o right heel pain x 3-4 weeks.  Stretching his achilles seems to help. Previous hx of plantar fascitis.    Review of Systems See HPI  Past Medical History:  Diagnosis Date  . Anemia   . Asthma    uses inhaler  . Basal cell carcinoma 2017   left side of nose  . Borderline diabetes 05/03/2011  . Colon polyps   . Diverticulosis   . GERD (gastroesophageal reflux disease)    on meds  . Hx of adenomatous colonic polyps 03/07/2009  . Hx of eye surgery 2018   "Left Eye was swollen and had procedure to remove excess swelling"  . Hyperglycemia 05/01/2011  . Hyperlipidemia   . Hypertension   . Lactose intolerance      Social History   Socioeconomic History  . Marital status: Married    Spouse name: Not on file  . Number of children: Not on file  . Years of education: Not on file  . Highest education level: Not on file  Occupational History  . Not on file  Social Needs  . Financial resource strain: Not on file  . Food insecurity:    Worry: Not on file    Inability: Not on file  . Transportation needs:    Medical: Not on file    Non-medical: Not on file  Tobacco Use  . Smoking status: Former Smoker    Types: Cigarettes  . Smokeless tobacco: Never Used  Substance and Sexual Activity  . Alcohol use: No    Alcohol/week: 0.0 standard drinks  . Drug use: No  . Sexual activity: Not on file  Lifestyle  . Physical activity:    Days per week: Not on file    Minutes per session: Not on file  . Stress: Not on file  Relationships  . Social connections:    Talks on phone: Not on file    Gets together: Not on file    Attends religious service: Not on file    Active member of club or organization: Not on file    Attends meetings of clubs or organizations: Not on file    Relationship status: Not on file  . Intimate  partner violence:    Fear of current or ex partner: Not on file    Emotionally abused: Not on file    Physically abused: Not on file    Forced sexual activity: Not on file  Other Topics Concern  . 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 CYSTECTOMY  05/2004   removed fromback /sebaceous cyst  . [place]   left  . mohs procedure  2017   left side of nose  . NASAL SEPTUM SURGERY      Family History  Problem Relation Age of Onset  . Coronary artery disease Father        CABG  X5  . Diabetes Father   . Colon cancer Maternal Aunt   . Cancer Brother        lung (smoker)  . Hypertension Unknown   . Kidney disease Unknown     Allergies  Allergen Reactions  . Sulfonamide Derivatives     REACTION: Hallucination    Current Outpatient Medications on File Prior to Visit  Medication Sig Dispense Refill  . acetaminophen (TYLENOL) 500 MG tablet Take 1 tablet (500 mg total) by mouth every 6 (six) hours as needed. 30 tablet 5  . Albuterol Sulfate (PROAIR RESPICLICK) 354 (90 Base) MCG/ACT AEPB Inhale 2 puffs into the lungs every 6 (six) hours as needed. 1 each 2  . amLODipine (NORVASC) 10 MG tablet TAKE 1 TABLET(10 MG) BY MOUTH DAILY 30 tablet 5  . aspirin 81 MG tablet Take 81 mg by mouth daily.      . cetirizine (ZYRTEC) 10 MG tablet Take 10 mg by mouth daily.      . fish oil-omega-3 fatty acids 1000 MG capsule Take 1,400 mg by mouth daily.    [name] guaiFENesin (MUCINEX) 600 MG 12 hr tablet Take 600 mg by mouth 2 (two) times daily as needed.    [name] losartan-hydrochlorothiazide (HYZAAR) 100-12.5 MG tablet TAKE 1 TABLET BY MOUTH DAILY 30 tablet 5  . metoprolol  succinate (TOPROL-XL) 25 MG 24 hr tablet TAKE 1 TABLET(25 MG) BY MOUTH DAILY 30 tablet 5  . omeprazole (PRILOSEC) 40 MG capsule TAKE 1 CAPSULE(40 MG) BY MOUTH DAILY 30 capsule 5  . pravastatin (PRAVACHOL) 40 MG tablet TAKE 1 TABLET(40 MG) BY MOUTH DAILY 30 tablet 5  . tamsulosin (FLOMAX) 0.4 MG CAPS capsule TAKE 1 CAPSULE(0.4 MG) BY MOUTH DAILY 30 capsule 5   No current facility-administered medications on file prior to visit.     BP 136/65 (BP Location: Left Arm, Patient Position: Sitting, Cuff Size: Large)   Pulse 76   Temp 98.1 F (36.7 C) (Oral)   Resp 16   Ht 5\' 7"  (1.702 m)   Wt 211 lb 12.8 oz (96.1 kg)   SpO2 98%   BMI 33.17 kg/m       Objective:   Physical Exam  Constitutional: He is oriented to person, place, and time. He appears well-developed and well-nourished. No distress.  HENT:  Head: Normocephalic and atraumatic.  Cardiovascular: Normal rate and regular rhythm.  No murmur heard. Pulmonary/Chest: Effort normal and breath sounds normal. No respiratory distress. He has no wheezes. He has no rales.  Musculoskeletal: He exhibits no edema.  Mild tenderness to palpation of right heel.   Neurological: He is alert and oriented to person, place, and time.  Skin: Skin is warm and dry.  Psychiatric: He has a normal mood and affect. His behavior is normal. Thought content normal.          Assessment & Plan:  Plantar fascitis- advised good supportive shoes, trial of meloxicam, daily stretching exercises provided from [employer].  Call if symptoms worsen or if not improved in 1 month.

## 2018-04-10 NOTE — Patient Instructions (Addendum)
Ice foot twice daily. Begin meloxicam once daily (anti-inflammatory). Begin exercises once daily.   Call if symptoms worsen or if not improved in 1 month.

## 2018-06-14 ENCOUNTER — Encounter: Payer: Self-pay | Admitting: Family

## 2018-06-14 DIAGNOSIS — R21 Rash and other nonspecific skin eruption: Secondary | ICD-10-CM

## 2018-06-15 MED ORDER — PREDNISONE 10 MG PO TABS: ORAL_TABLET | ORAL | 0 refills | Status: DC

## 2018-06-17 ENCOUNTER — Other Ambulatory Visit: Payer: Self-pay | Admitting: Family

## 2018-06-30 ENCOUNTER — Ambulatory Visit (INDEPENDENT_AMBULATORY_CARE_PROVIDER_SITE_OTHER): Payer: Managed Care, Other (non HMO) | Admitting: Allergy and Immunology

## 2018-06-30 ENCOUNTER — Encounter: Payer: Self-pay | Admitting: Allergy and Immunology

## 2018-06-30 VITALS — BP 128/66 | HR 76 | Temp 98.3°F | Resp 18 | Ht 67.0 in | Wt 220.0 lb

## 2018-06-30 DIAGNOSIS — H1010 Acute atopic conjunctivitis, unspecified eye: Secondary | ICD-10-CM | POA: Insufficient documentation

## 2018-06-30 DIAGNOSIS — K2970 Gastritis, unspecified, without bleeding: Secondary | ICD-10-CM

## 2018-06-30 DIAGNOSIS — L500 Allergic urticaria: Secondary | ICD-10-CM

## 2018-06-30 DIAGNOSIS — J4520 Mild intermittent asthma, uncomplicated: Secondary | ICD-10-CM

## 2018-06-30 DIAGNOSIS — J3089 Other allergic rhinitis: Secondary | ICD-10-CM | POA: Diagnosis not present

## 2018-06-30 DIAGNOSIS — H1013 Acute atopic conjunctivitis, bilateral: Secondary | ICD-10-CM

## 2018-06-30 DIAGNOSIS — T7840XD Allergy, unspecified, subsequent encounter: Secondary | ICD-10-CM

## 2018-06-30 HISTORY — DX: Mild intermittent asthma, uncomplicated: J45.20

## 2018-06-30 HISTORY — DX: Acute atopic conjunctivitis, unspecified eye: H10.10

## 2018-06-30 MED ORDER — FLUTICASONE PROPIONATE 50 MCG/ACT NA SUSP: 1.0000 | Freq: Every day | NASAL | 5 refills | Status: DC | PRN

## 2018-06-30 MED ORDER — MONTELUKAST SODIUM 10 MG PO TABS: 10.0000 mg | ORAL_TABLET | Freq: Every day | ORAL | 5 refills | Status: DC

## 2018-06-30 MED ORDER — LEVOCETIRIZINE DIHYDROCHLORIDE 5 MG PO TABS: 5.0000 mg | ORAL_TABLET | Freq: Every evening | ORAL | 5 refills | Status: DC

## 2018-06-30 NOTE — Patient Instructions (Addendum)
Rash/urticaria Unclear etiology. Skin tests to select food allergens were negative today. NSAIDs and emotional stress commonly exacerbate urticaria but are not the underlying etiology in this case. Physical urticarias are negative by history (i.e. pressure-induced, temperature, vibration, solar, etc.). There are no concomitant symptoms concerning for anaphylaxis or constitutional symptoms worrisome for an underlying malignancy. We will rule out other potential etiologies with labs. For symptom relief, patient is to take oral antihistamines as directed.  The following labs have been ordered: FCeRI antibody, anti-thyroglobulin antibody, thyroid peroxidase antibody, tryptase, urea breath test, CBC, CMP, ESR, ANA, and galactose-alpha-1,3-galactose IgE level.  The patient will be called with further recommendations after lab results have returned.  Instructions have been discussed and provided for H1/H2 receptor blockade with titration to find lowest effective dose.  A prescription has been provided for levocetirizine, 5 mg daily as needed.  A prescription has been provided for montelukast 10 mg daily at bedtime.  Should there be a significant increase or change in symptoms, a journal is to be kept recording any foods eaten, beverages consumed, medications taken within a 6 hour period prior to the onset of symptoms, as well as record activities being performed, and environmental conditions. For any symptoms concerning for anaphylaxis, 911 is to be called immediately.  Seasonal and perennial allergic rhinitis  Aeroallergen avoidance measures have been discussed and provided in written form.  Levocetirizine and montelukast have been prescribed (as above).  A prescription has been provided for fluticasone nasal spray, one spray per nostril 1-2 times daily as needed. Proper nasal spray technique has been discussed and demonstrated.  Nasal saline spray (i.e., Simply Saline) or nasal saline lavage (i.e.,  NeilMed) is recommended as needed and prior to medicated nasal sprays.  If allergen avoidance measures and medications fail to adequately relieve symptoms, aeroallergen immunotherapy will be considered.  Allergic conjunctivitis  Treatment plan as outlined above for allergic rhinitis.  A prescription has been provided for Pataday, one drop per eye daily as needed.  I have also recommended eye lubricant drops (i.e., Natural Tears) as needed.  Mild intermittent asthma  Continue albuterol HFA, 1 to 2 inhalations every 4-6 hours as needed.   When lab results have returned the patient will be called with further recommendations and follow up instructions.   Urticaria (Hives)  . Levocetirizine (Xyzal) 5 mg twice a day and famotidine (Pepcid) 20 mg twice a day. If no symptoms for 7-14 days then decrease to. . Levocetirizine (Xyzal) 5 mg twice a day and famotidine (Pepcid) 20 mg once a day.  If no symptoms for 7-14 days then decrease to. . Levocetirizine (Xyzal) 5 mg twice a day.  If no symptoms for 7-14 days then decrease to. . Levocetirizine (Xyzal) 5 mg once a day.  May use Benadryl (diphenhydramine) as needed for breakthrough symptoms       If symptoms return, then step up dosage  Reducing Pollen Exposure  The American Academy of Allergy, Asthma and Immunology suggests the following steps to reduce your exposure to pollen during allergy seasons.    1. Do not hang sheets or clothing out to dry; pollen may collect on these items. 2. Do not mow lawns or spend time around freshly cut grass; mowing stirs up pollen. 3. Keep windows closed at night.  Keep car windows closed while driving. 4. Minimize morning activities outdoors, a time when pollen counts are usually at their highest. 5. Stay indoors as much as possible when pollen counts or humidity is high and on  windy days when pollen tends to remain in the air longer. 6. Use air conditioning when possible.  Many air conditioners have  filters that trap the pollen spores. 7. Use a HEPA room air filter to remove pollen form the indoor air you breathe.   Control of Mold Allergen  Mold and fungi can grow on a variety of surfaces provided certain temperature and moisture conditions exist.  Outdoor molds grow on plants, decaying vegetation and soil.  The major outdoor mold, Alternaria and Cladosporium, are found in very high numbers during hot and dry conditions.  Generally, a late Summer - Fall peak is seen for common outdoor fungal spores.  Rain will temporarily lower outdoor mold spore count, but counts rise rapidly when the rainy period ends.  The most important indoor molds are Aspergillus and Penicillium.  Dark, humid and poorly ventilated basements are ideal sites for mold growth.  The next most common sites of mold growth are the bathroom and the kitchen.  Outdoor [employer] 1. Use air conditioning and keep windows closed 2. Avoid exposure to decaying vegetation. 3. Avoid leaf raking. 4. Avoid grain handling. 5. Consider wearing a face mask if working in moldy areas.  Indoor Mold Control 1. Maintain humidity below 50%. 2. Clean washable surfaces with 5% bleach solution. 3. Remove sources e.g. Contaminated carpets.

## 2018-06-30 NOTE — Progress Notes (Signed)
New Patient Note  RE: Aaron Ruiz MRN: [id number] DOB: [birth year] Date of Office Visit: 06/30/2018  Referring provider: [name], NP Primary care provider: [name], NP  Chief Complaint: Rash and Urticaria   History of present illness: Aaron Ruiz is a 62 y.o. male seen today in consultation requested by [name], NP.  He reports that over the past 4 years he has experienced a rash that has been "coming and going" on his lower and upper extremities.  The rash occurs 5 or 6 times per year and lasts 1 to 3 weeks on average prior to resolution.  The rash is described as erythematous, pruritic, and occasionally raised.  He does not experience concomitant angioedema, cardiopulmonary symptoms, or GI symptoms. He has tried to figure out what has been triggering the rash.  On a couple occasions it occurred a week or so after having given blood.  He also thought that it may have been related to air fresheners that his wife put into the home.  However, the rash has occurred in the absence of donating blood and in the absence of the aforementioned air fresheners.  He attempts to control the rash with diphenhydramine as needed. Aaron Ruiz experiences nasal congestion, rhinorrhea, sneezing, postnasal drainage, nasal pruritus, ocular pruritus, and occasional sinus pressure.  These symptoms occur year around but are more frequent and severe during the springtime and in the fall.  He attempts to control the symptoms with diphenhydramine and over-the-counter cold and sinus medication. He was diagnosed with asthma during childhood.  He typically requires albuterol rescue 1-3 times per year.  He notes that his asthma occurred more frequently when living in a home over crawlspace rather than over a basement.  Assessment and plan: Rash/urticaria Unclear etiology. Skin tests to select food allergens were negative today. NSAIDs and emotional stress commonly exacerbate urticaria  but are not the underlying etiology in this case. Physical urticarias are negative by history (i.e. pressure-induced, temperature, vibration, solar, etc.). There are no concomitant symptoms concerning for anaphylaxis or constitutional symptoms worrisome for an underlying malignancy. We will rule out other potential etiologies with labs. For symptom relief, patient is to take oral antihistamines as directed.  The following labs have been ordered: FCeRI antibody, anti-thyroglobulin antibody, thyroid peroxidase antibody, tryptase, urea breath test, CBC, CMP, ESR, ANA, and galactose-alpha-1,3-galactose IgE level.  The patient will be called with further recommendations after lab results have returned.  Instructions have been discussed and provided for H1/H2 receptor blockade with titration to find lowest effective dose.  A prescription has been provided for levocetirizine, 5 mg daily as needed.  A prescription has been provided for montelukast 10 mg daily at bedtime.  Should there be a significant increase or change in symptoms, a journal is to be kept recording any foods eaten, beverages consumed, medications taken within a 6 hour period prior to the onset of symptoms, as well as record activities being performed, and environmental conditions. For any symptoms concerning for anaphylaxis, 911 is to be called immediately.  Seasonal and perennial allergic rhinitis  Aeroallergen avoidance measures have been discussed and provided in written form.  Levocetirizine and montelukast have been prescribed (as above).  A prescription has been provided for fluticasone nasal spray, one spray per nostril 1-2 times daily as needed. Proper nasal spray technique has been discussed and demonstrated.  Nasal saline spray (i.e., Simply Saline) or nasal saline lavage (i.e., NeilMed) is recommended as needed and prior to medicated nasal sprays.  If  allergen avoidance measures and medications fail to adequately relieve  symptoms, aeroallergen immunotherapy will be considered.  Allergic conjunctivitis  Treatment plan as outlined above for allergic rhinitis.  A prescription has been provided for Pataday, one drop per eye daily as needed.  I have also recommended eye lubricant drops (i.e., Natural Tears) as needed.  Mild intermittent asthma  Continue albuterol HFA, 1 to 2 inhalations every 4-6 hours as needed.   Meds ordered this encounter  Medications  . montelukast (SINGULAIR) 10 MG tablet    Sig: Take 1 tablet (10 mg total) by mouth at bedtime.    Dispense:  30 tablet    Refill:  5  . levocetirizine (XYZAL) 5 MG tablet    Sig: Take 1 tablet (5 mg total) by mouth every evening.    Dispense:  60 tablet    Refill:  5  . fluticasone (FLONASE) 50 MCG/ACT nasal spray    Sig: Place 1-2 sprays into both nostrils daily as needed for allergies or rhinitis.    Dispense:  16 g    Refill:  5    Diagnostics: Spirometry: FVC was 3.57 L and FEV1 was 2.78 L (89% predicted) with an FEV1 ratio of 103%.  Please see scanned spirometry results for details. Environmental skin testing: Reactive to grass pollen, ragweed pollen, tree pollen, and molds. Food allergen skin testing: Negative despite a positive histamine control.    Physical examination: Blood pressure 128/66, pulse 76, temperature 98.3 F (36.8 C), temperature source Oral, resp. rate 18, height 5' 7"  (1.702 m), weight 220 lb (99.8 kg), SpO2 97 %.  General: Alert, interactive, in no acute distress. HEENT: TMs pearly gray, turbinates moderately edematous without discharge, post-pharynx mildly erythematous. Neck: Supple without lymphadenopathy. Lungs: Clear to auscultation without wheezing, rhonchi or rales. CV: Normal S1, S2 without murmurs. Abdomen: Nondistended, nontender. Skin: Warm and dry, without lesions or rashes. Extremities:  No clubbing, cyanosis or edema. Neuro:   Grossly intact.  Review of systems:  Review of systems negative except  as noted in HPI / PMHx or noted below: Review of Systems  Constitutional: Negative.   HENT: Negative.   Eyes: Negative.   Respiratory: Negative.   Cardiovascular: Negative.   Gastrointestinal: Negative.   Genitourinary: Negative.   Musculoskeletal: Negative.   Skin: Negative.   Neurological: Negative.   Endo/Heme/Allergies: Negative.   Psychiatric/Behavioral: Negative.     Past medical history:  Past Medical History:  Diagnosis Date  . Anemia   . Asthma    uses inhaler  . Basal cell carcinoma 2017   left side of nose  . Borderline diabetes 05/03/2011  . Colon polyps   . Diverticulosis   . GERD (gastroesophageal reflux disease)    on meds  . Hx of adenomatous colonic polyps 03/07/2009  . Hx of eye surgery 2018   "Left Eye was swollen and had procedure to remove excess swelling"  . Hyperglycemia 05/01/2011  . Hyperlipidemia   . Hypertension   . Lactose intolerance     Past surgical history:  Past Surgical History:  Procedure Laterality Date  . CYSTECTOMY  05/2004   removed fromback /sebaceous cyst  . [place]   left  . mohs procedure  2017   left side of nose  . NASAL SEPTUM SURGERY      Family history: Family History  Problem Relation Age of Onset  . Coronary artery disease Father        CABG X5  . Diabetes Father   .  Colon cancer Maternal Aunt   . Cancer Brother        lung (smoker)  . Hypertension Other   . Kidney disease Other     Social history: Social History   Socioeconomic History  . Marital status: Married    Spouse name: Not on file  . Number of children: Not on file  . Years of education: Not on file  . Highest education level: Not on file  Occupational History  . Not on file  Social Needs  . Financial resource strain: Not on file  . Food insecurity:    Worry: Not on file    Inability: Not on file  . Transportation needs:    Medical: Not on file    Non-medical: Not on file  Tobacco Use  . Smoking status:  Former Smoker    Types: Cigarettes  . Smokeless tobacco: Never Used  Substance and Sexual Activity  . Alcohol use: No    Alcohol/week: 0.0 standard drinks  . Drug use: No  . Sexual activity: Not on file  Lifestyle  . Physical activity:    Days per week: Not on file    Minutes per session: Not on file  . Stress: Not on file  Relationships  . Social connections:    Talks on phone: Not on file    Gets together: Not on file    Attends religious service: Not on file    Active member of club or organization: Not on file    Attends meetings of clubs or organizations: Not on file    Relationship status: Not on file  . Intimate partner violence:    Fear of current or ex partner: Not on file    Emotionally abused: Not on file    Physically abused: Not on file    Forced sexual activity: Not on file  Other Topics Concern  . 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  There is a pet turtle in the home, otherwise there are no pets.  There is no known mold/water damage in the home.  He is a non-smoker.  Allergies as of 06/30/2018      Reactions   Sulfonamide Derivatives    REACTION: Hallucination      Medication List       Accurate as of June 30, 2018  1:42 PM. Always use your most recent med list.        acetaminophen 500 MG tablet Commonly known as:  TYLENOL Take 1 tablet (500 mg total) by mouth every 6 (six) hours as needed.   Albuterol Sulfate 108 (90 Base) MCG/ACT Aepb Commonly known as:  PROAIR RESPICLICK Inhale 2 puffs into the lungs every 6 (six) hours as needed.   amLODipine 10 MG tablet Commonly known  as:  NORVASC TAKE 1 TABLET(10 MG) BY MOUTH DAILY   aspirin 81 MG tablet Take 81 mg by mouth daily.   cetirizine 10 MG tablet Commonly known as:  ZYRTEC Take 10 mg by mouth daily.   fish oil-omega-3 fatty acids 1000 MG capsule Take  1,400 mg by mouth daily.   fluticasone 50 MCG/ACT nasal spray Commonly known as:  FLONASE Place 1-2 sprays into both nostrils daily as needed for allergies or rhinitis.   guaiFENesin 600 MG 12 hr tablet Commonly known as:  MUCINEX Take 600 mg by mouth 2 (two) times daily as needed.   levocetirizine 5 MG tablet Commonly known as:  XYZAL Take 1 tablet (5 mg total) by mouth every evening.   losartan-hydrochlorothiazide 100-12.5 MG tablet Commonly known as:  HYZAAR TAKE 1 TABLET BY MOUTH DAILY   metoprolol succinate 25 MG 24 hr tablet Commonly known as:  TOPROL-XL TAKE 1 TABLET(25 MG) BY MOUTH DAILY   montelukast 10 MG tablet Commonly known as:  SINGULAIR Take 1 tablet (10 mg total) by mouth at bedtime.   omeprazole 40 MG capsule Commonly known as:  PRILOSEC TAKE 1 CAPSULE(40 MG) BY MOUTH DAILY   pravastatin 40 MG tablet Commonly known as:  PRAVACHOL TAKE 1 TABLET(40 MG) BY MOUTH DAILY   tamsulosin 0.4 MG Caps capsule Commonly known as:  FLOMAX TAKE 1 CAPSULE(0.4 MG) BY MOUTH DAILY       Known medication allergies: Allergies  Allergen Reactions  . Sulfonamide Derivatives     REACTION: Hallucination    I appreciate the opportunity to take part in Hasten's care. Please do not hesitate to contact me with questions.  Sincerely,   R. Edgar Frisk, MD

## 2018-06-30 NOTE — Assessment & Plan Note (Addendum)
Unclear etiology. Skin tests to select food allergens were negative today. NSAIDs and emotional stress commonly exacerbate urticaria but are not the underlying etiology in this case. Physical urticarias are negative by history (i.e. pressure-induced, temperature, vibration, solar, etc.). There are no concomitant symptoms concerning for anaphylaxis or constitutional symptoms worrisome for an underlying malignancy. We will rule out other potential etiologies with labs. For symptom relief, patient is to take oral antihistamines as directed.  The following labs have been ordered: FCeRI antibody, anti-thyroglobulin antibody, thyroid peroxidase antibody, tryptase, urea breath test, CBC, CMP, ESR, ANA, and galactose-alpha-1,3-galactose IgE level.  The patient will be called with further recommendations after lab results have returned.  Instructions have been discussed and provided for H1/H2 receptor blockade with titration to find lowest effective dose.  A prescription has been provided for levocetirizine, 5 mg daily as needed.  A prescription has been provided for montelukast 10 mg daily at bedtime.  Should there be a significant increase or change in symptoms, a journal is to be kept recording any foods eaten, beverages consumed, medications taken within a 6 hour period prior to the onset of symptoms, as well as record activities being performed, and environmental conditions. For any symptoms concerning for anaphylaxis, 911 is to be called immediately.

## 2018-06-30 NOTE — Assessment & Plan Note (Signed)
   Treatment plan as outlined above for allergic rhinitis.  A prescription has been provided for Pataday, one drop per eye daily as needed.  I have also recommended eye lubricant drops (i.e., Natural Tears) as needed. 

## 2018-06-30 NOTE — Assessment & Plan Note (Signed)
   Aeroallergen avoidance measures have been discussed and provided in written form.  Levocetirizine and montelukast have been prescribed (as above).  A prescription has been provided for fluticasone nasal spray, one spray per nostril 1-2 times daily as needed. Proper nasal spray technique has been discussed and demonstrated.  Nasal saline spray (i.e., Simply Saline) or nasal saline lavage (i.e., NeilMed) is recommended as needed and prior to medicated nasal sprays.  If allergen avoidance measures and medications fail to adequately relieve symptoms, aeroallergen immunotherapy will be considered.

## 2018-06-30 NOTE — Assessment & Plan Note (Signed)
   Continue albuterol HFA, 1 to 2 inhalations every 4-6 hours as needed.

## 2018-07-02 LAB — H. PYLORI BREATH TEST: H pylori Breath Test: NEGATIVE

## 2018-07-06 ENCOUNTER — Telehealth: Payer: Self-pay | Admitting: *Deleted

## 2018-07-06 NOTE — Telephone Encounter (Signed)
Patient called for lab results.

## 2018-07-06 NOTE — Telephone Encounter (Signed)
Spoke to patient advised labs are not back yet patient verbalized understanding

## 2018-07-09 LAB — CBC WITH DIFFERENTIAL/PLATELET
Basophils Absolute: 0 10*3/uL (ref 0.0–0.2)
Basos: 0 %
EOS (ABSOLUTE): 0.2 10*3/uL (ref 0.0–0.4)
Eos: 4 %
Hematocrit: 39.7 % (ref 37.5–51.0)
Hemoglobin: 13.2 g/dL (ref 13.0–17.7)
Immature Grans (Abs): 0 10*3/uL (ref 0.0–0.1)
Immature Granulocytes: 0 %
Lymphocytes Absolute: 1.5 10*3/uL (ref 0.7–3.1)
Lymphs: 26 %
MCH: 28.8 pg (ref 26.6–33.0)
MCHC: 33.2 g/dL (ref 31.5–35.7)
MCV: 87 fL (ref 79–97)
Monocytes Absolute: 0.4 10*3/uL (ref 0.1–0.9)
Monocytes: 7 %
Neutrophils Absolute: 3.6 10*3/uL (ref 1.4–7.0)
Neutrophils: 63 %
Platelets: 162 10*3/uL (ref 150–450)
RBC: 4.58 x10E6/uL (ref 4.14–5.80)
RDW: 13.5 % (ref 12.3–15.4)
WBC: 5.8 10*3/uL (ref 3.4–10.8)

## 2018-07-09 LAB — COMPREHENSIVE METABOLIC PANEL
ALT: 24 IU/L (ref 0–44)
AST: 23 IU/L (ref 0–40)
Albumin/Globulin Ratio: 2.1 (ref 1.2–2.2)
Albumin: 4.6 g/dL (ref 3.6–4.8)
Alkaline Phosphatase: 62 IU/L (ref 39–117)
BUN/Creatinine Ratio: 14 (ref 10–24)
BUN: 12 mg/dL (ref 8–27)
Bilirubin Total: 0.4 mg/dL (ref 0.0–1.2)
CO2: 24 mmol/L (ref 20–29)
Calcium: 9 mg/dL (ref 8.6–10.2)
Chloride: 106 mmol/L (ref 96–106)
Creatinine, Ser: 0.88 mg/dL (ref 0.76–1.27)
GFR calc Af Amer: 106 mL/min/{1.73_m2} (ref >59)
GFR calc non Af Amer: 91 mL/min/{1.73_m2} (ref >59)
Globulin, Total: 2.2 g/dL (ref 1.5–4.5)
Glucose: 134 mg/dL — ABNORMAL HIGH (ref 65–99)
Potassium: 4.6 mmol/L (ref 3.5–5.2)
Sodium: 141 mmol/L (ref 134–144)
Total Protein: 6.8 g/dL (ref 6.0–8.5)

## 2018-07-09 LAB — ALPHA-GAL PANEL
Alpha Gal IgE*: <0.1 kU/L (ref <0.10)
Beef (Bos spp) IgE: <0.1 kU/L (ref <0.35)
Class Interpretation: 0
Class Interpretation: 0
Class Interpretation: 0
Lamb/Mutton (Ovis spp) IgE: <0.1 kU/L (ref <0.35)
Pork (Sus spp) IgE: <0.1 kU/L (ref <0.35)

## 2018-07-09 LAB — CHRONIC URTICARIA: cu index: <1 (ref <10)

## 2018-07-09 LAB — SEDIMENTATION RATE: Sed Rate: 38 mm/hr — ABNORMAL HIGH (ref 0–30)

## 2018-07-09 LAB — THYROID ANTIBODIES
Thyroglobulin Antibody: 14.1 IU/mL — ABNORMAL HIGH (ref 0.0–0.9)
Thyroperoxidase Ab SerPl-aCnc: 8 IU/mL (ref 0–34)

## 2018-07-09 LAB — ANA W/REFLEX IF POSITIVE: Anti Nuclear Antibody(ANA): NEGATIVE

## 2018-07-09 LAB — TRYPTASE: Tryptase: 5.5 ug/L (ref 2.2–13.2)

## 2018-12-18 ENCOUNTER — Other Ambulatory Visit: Payer: Self-pay | Admitting: Family

## 2018-12-22 ENCOUNTER — Telehealth: Payer: Self-pay | Admitting: Family

## 2018-12-22 ENCOUNTER — Telehealth: Payer: Self-pay | Admitting: Allergy and Immunology

## 2018-12-22 MED ORDER — MONTELUKAST SODIUM 10 MG PO TABS: 10.0000 mg | ORAL_TABLET | Freq: Every day | ORAL | 0 refills | Status: DC

## 2018-12-22 NOTE — Telephone Encounter (Signed)
Received fax for refill for montelukast. Patient was last seen 06/30/2018. Courtesy refill sent in, patient will need OV for further refills.

## 2018-12-22 NOTE — Telephone Encounter (Signed)
Please contact pt to schedule follow up.  

## 2018-12-23 ENCOUNTER — Telehealth (INDEPENDENT_AMBULATORY_CARE_PROVIDER_SITE_OTHER): Payer: Managed Care, Other (non HMO) | Admitting: Family

## 2018-12-23 ENCOUNTER — Other Ambulatory Visit: Payer: Self-pay

## 2018-12-23 DIAGNOSIS — R739 Hyperglycemia, unspecified: Secondary | ICD-10-CM

## 2018-12-23 DIAGNOSIS — I10 Essential (primary) hypertension: Secondary | ICD-10-CM | POA: Diagnosis not present

## 2018-12-23 DIAGNOSIS — N400 Enlarged prostate without lower urinary tract symptoms: Secondary | ICD-10-CM | POA: Diagnosis not present

## 2018-12-23 DIAGNOSIS — E785 Hyperlipidemia, unspecified: Secondary | ICD-10-CM | POA: Diagnosis not present

## 2018-12-23 MED ORDER — LOSARTAN POTASSIUM 100 MG PO TABS: 100.0000 mg | ORAL_TABLET | Freq: Every day | ORAL | 5 refills | Status: DC

## 2018-12-23 MED ORDER — METOPROLOL SUCCINATE ER 25 MG PO TB24: ORAL_TABLET | ORAL | 5 refills | Status: DC

## 2018-12-23 MED ORDER — OMEPRAZOLE 40 MG PO CPDR: DELAYED_RELEASE_CAPSULE | ORAL | 5 refills | Status: DC

## 2018-12-23 MED ORDER — HYDROCHLOROTHIAZIDE 12.5 MG PO TABS: 12.5000 mg | ORAL_TABLET | Freq: Every day | ORAL | 5 refills | Status: DC

## 2018-12-23 MED ORDER — PRAVASTATIN SODIUM 40 MG PO TABS: ORAL_TABLET | ORAL | 5 refills | Status: DC

## 2018-12-23 MED ORDER — TAMSULOSIN HCL 0.4 MG PO CAPS: ORAL_CAPSULE | ORAL | 5 refills | Status: DC

## 2018-12-23 MED ORDER — AMLODIPINE BESYLATE 10 MG PO TABS: ORAL_TABLET | ORAL | 5 refills | Status: DC

## 2018-12-23 NOTE — Progress Notes (Signed)
Virtual Visit via Video Note  I connected with Aaron Ruiz on 12/23/18 at  9:00 AM EDT by a video enabled telemedicine application and verified that I am speaking with the correct person using two identifiers.  Location: Patient: home Provider: home   I discussed the limitations of evaluation and management by telemedicine and the availability of in person appointments. The patient expressed understanding and agreed to proceed.  History of Present Illness:  Patient states that he was furloughed from his job.   Feels OK about this. He is expecting a new grandbaby in a few months and plans to be the caregiver when her mother returns to work.   HTN- reports that he gave blood on 6/15 and bp was checked.  bp was 132/64.  Continues bp meds. Reports good compliance.  GERD- reports that his symptoms are stale on gerd.  Hyperlipidemia- continues statin.  Lab Results  Component Value Date   CHOL 164 12/05/2017   HDL 32.80 (L) 12/05/2017   LDLCALC 96 12/05/2017   TRIG 174.0 (H) 12/05/2017   CHOLHDL 5 12/05/2017   Lab Results  Component Value Date   HGBA1C 5.8 12/25/2015   Reports weight 190.4.  Reports that he has been walking 2-5 miles a day 5-6 days a week.   Wt Readings from Last 3 Encounters:  06/30/18 220 lb (99.8 kg)  04/10/18 211 lb 12.8 oz (96.1 kg)  12/05/17 205 lb 3.2 oz (93.1 kg)   Past Medical History:  Diagnosis Date  . Anemia   . Asthma    uses inhaler  . Basal cell carcinoma 2017   left side of nose  . Borderline diabetes 05/03/2011  . Colon polyps   . Diverticulosis   . GERD (gastroesophageal reflux disease)    on meds  . Hx of adenomatous colonic polyps 03/07/2009  . Hx of eye surgery 2018   "Left Eye was swollen and had procedure to remove excess swelling"  . Hyperglycemia 05/01/2011  . Hyperlipidemia   . Hypertension   . Lactose intolerance      Social History   Socioeconomic History  . Marital status: Married    Spouse name: Not on file  .  Number of children: Not on file  . Years of education: Not on file  . Highest education level: Not on file  Occupational History  . Not on file  Social Needs  . Financial resource strain: Not on file  . Food insecurity    Worry: Not on file    Inability: Not on file  . Transportation needs    Medical: Not on file    Non-medical: Not on file  Tobacco Use  . Smoking status: Former Smoker    Types: Cigarettes  . Smokeless tobacco: Never Used  Substance and Sexual Activity  . Alcohol use: No    Alcohol/week: 0.0 standard drinks  . Drug use: No  . Sexual activity: Not on file  Lifestyle  . Physical activity    Days per week: Not on file    Minutes per session: Not on file  . Stress: Not on file  Relationships  . Social Herbalist on phone: Not on file    Gets together: Not on file    Attends religious service: Not on file    Active member of club or organization: Not on file    Attends meetings of clubs or organizations: Not on file    Relationship status: Not on file  .  Intimate partner violence    Fear of current or ex partner: Not on file    Emotionally abused: Not on file    Physically abused: Not on file    Forced sexual activity: Not on file  Other Topics Concern  . 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 CYSTECTOMY  05/2004   removed fromback /sebaceous cyst  . [place]   left  . mohs procedure  2017   left side of nose  . NASAL SEPTUM SURGERY      Family History  Problem Relation Age of Onset  . Coronary artery disease Father        CABG X5  . Diabetes Father   . Colon cancer Maternal Aunt   . Cancer Brother         lung (smoker)  . Hypertension Other   . Kidney disease Other     Allergies  Allergen Reactions  . Sulfonamide Derivatives     REACTION: Hallucination    Current Outpatient Medications on File Prior to Visit  Medication Sig Dispense Refill  . acetaminophen (TYLENOL) 500 MG tablet Take 1 tablet (500 mg total) by mouth every 6 (six) hours as needed. 30 tablet 5  . Albuterol Sulfate (PROAIR RESPICLICK) 536 (90 Base) MCG/ACT AEPB Inhale 2 puffs into the lungs every 6 (six) hours as needed. 1 each 2  . aspirin 81 MG tablet Take 81 mg by mouth daily.      . cetirizine (ZYRTEC) 10 MG tablet Take 10 mg by mouth daily.      . fish oil-omega-3 fatty acids 1000 MG capsule Take 1,400 mg by mouth daily.    . fluticasone (FLONASE) 50 MCG/ACT nasal spray Place 1-2 sprays into both nostrils daily as needed for allergies or rhinitis. 16 g 5  . guaiFENesin (MUCINEX) 600 MG 12 hr tablet Take 600 mg by mouth 2 (two) times daily as needed.    [name] levocetirizine (XYZAL) 5 MG tablet Take 1 tablet (5 mg total) by mouth every evening. 60 tablet 5  . montelukast (SINGULAIR) 10 MG tablet Take 1 tablet (10 mg total) by mouth at bedtime. 30 tablet 0   No current facility-administered medications on file prior to visit.     There were no vitals taken for this visit.      Observations/Objective:   Gen: Awake, alert, no acute distress Resp: Breathing is even and non-labored Psych: calm/pleasant demeanor Neuro: Alert and Oriented x 3, + facial symmetry, speech is clear.   Assessment and Plan:  HTN- bp stable on current meds. Continue same. Obtain follow up cmet.  Hyperglycemia- check follow up A1C.  BPH- stable on flomax. Requests follow uppsa.  GERD- stable on PPI, continue same   Follow Up Instructions:    I discussed the assessment and treatment plan with the patient. The patient was provided an opportunity to ask questions and all were answered. The patient agreed with the plan and  demonstrated an understanding of the instructions.   The patient was advised to call back or seek an in-person  evaluation if the symptoms worsen or if the condition fails to improve as anticipated.  Nance Pear, NP

## 2018-12-31 ENCOUNTER — Other Ambulatory Visit: Payer: Self-pay

## 2018-12-31 ENCOUNTER — Other Ambulatory Visit (INDEPENDENT_AMBULATORY_CARE_PROVIDER_SITE_OTHER): Payer: Managed Care, Other (non HMO)

## 2018-12-31 DIAGNOSIS — N400 Enlarged prostate without lower urinary tract symptoms: Secondary | ICD-10-CM | POA: Diagnosis not present

## 2018-12-31 DIAGNOSIS — I10 Essential (primary) hypertension: Secondary | ICD-10-CM

## 2018-12-31 DIAGNOSIS — R739 Hyperglycemia, unspecified: Secondary | ICD-10-CM

## 2018-12-31 DIAGNOSIS — E785 Hyperlipidemia, unspecified: Secondary | ICD-10-CM | POA: Diagnosis not present

## 2018-12-31 LAB — COMPREHENSIVE METABOLIC PANEL
ALT: 12 U/L (ref 0–53)
AST: 13 U/L (ref 0–37)
Albumin: 4.5 g/dL (ref 3.5–5.2)
Alkaline Phosphatase: 58 U/L (ref 39–117)
BUN: 21 mg/dL (ref 6–23)
CO2: 26 mEq/L (ref 19–32)
Calcium: 8.7 mg/dL (ref 8.4–10.5)
Chloride: 106 mEq/L (ref 96–112)
Creatinine, Ser: 0.9 mg/dL (ref 0.40–1.50)
GFR: 84.83 mL/min (ref >60.00)
Glucose, Bld: 98 mg/dL (ref 70–99)
Potassium: 4.3 mEq/L (ref 3.5–5.1)
Sodium: 140 mEq/L (ref 135–145)
Total Bilirubin: 0.6 mg/dL (ref 0.2–1.2)
Total Protein: 6.7 g/dL (ref 6.0–8.3)

## 2018-12-31 LAB — LIPID PANEL
Cholesterol: 140 mg/dL (ref 0–200)
HDL: 36.4 mg/dL — ABNORMAL LOW (ref >39.00)
LDL Cholesterol: 80 mg/dL (ref 0–99)
NonHDL: 103.48
Total CHOL/HDL Ratio: 4
Triglycerides: 119 mg/dL (ref 0.0–149.0)
VLDL: 23.8 mg/dL (ref 0.0–40.0)

## 2018-12-31 LAB — HEMOGLOBIN A1C: Hgb A1c MFr Bld: 5.7 % (ref 4.6–6.5)

## 2018-12-31 LAB — PSA: PSA: 0.64 ng/mL (ref 0.10–4.00)

## 2019-01-18 ENCOUNTER — Other Ambulatory Visit: Payer: Self-pay | Admitting: Family

## 2019-01-24 ENCOUNTER — Encounter: Payer: Self-pay | Admitting: Family

## 2019-03-30 ENCOUNTER — Ambulatory Visit: Payer: BC Managed Care – PPO | Admitting: Family

## 2019-03-30 ENCOUNTER — Other Ambulatory Visit: Payer: Self-pay

## 2019-03-30 ENCOUNTER — Encounter: Payer: Self-pay | Admitting: Family

## 2019-03-30 VITALS — BP 111/66 | HR 57 | Temp 97.1°F | Resp 16 | Ht 67.0 in | Wt 186.0 lb

## 2019-03-30 DIAGNOSIS — I10 Essential (primary) hypertension: Secondary | ICD-10-CM

## 2019-03-30 DIAGNOSIS — K219 Gastro-esophageal reflux disease without esophagitis: Secondary | ICD-10-CM | POA: Diagnosis not present

## 2019-03-30 DIAGNOSIS — E785 Hyperlipidemia, unspecified: Secondary | ICD-10-CM | POA: Diagnosis not present

## 2019-03-30 DIAGNOSIS — Z23 Encounter for immunization: Secondary | ICD-10-CM

## 2019-03-30 MED ORDER — PRAVASTATIN SODIUM 40 MG PO TABS: ORAL_TABLET | ORAL | 1 refills | Status: DC

## 2019-03-30 MED ORDER — AMLODIPINE BESYLATE 10 MG PO TABS: ORAL_TABLET | ORAL | 1 refills | Status: DC

## 2019-03-30 MED ORDER — METOPROLOL SUCCINATE ER 25 MG PO TB24: ORAL_TABLET | ORAL | 1 refills | Status: DC

## 2019-03-30 MED ORDER — LOSARTAN POTASSIUM 100 MG PO TABS: 100.0000 mg | ORAL_TABLET | Freq: Every day | ORAL | 1 refills | Status: DC

## 2019-03-30 MED ORDER — TAMSULOSIN HCL 0.4 MG PO CAPS: ORAL_CAPSULE | ORAL | 1 refills | Status: DC

## 2019-03-30 MED ORDER — OMEPRAZOLE 40 MG PO CPDR: DELAYED_RELEASE_CAPSULE | ORAL | 1 refills | Status: DC

## 2019-03-30 NOTE — Progress Notes (Signed)
Subjective:    Patient ID: Aaron Ruiz, male    DOB: [birth year], 63 y.o.   MRN: [id number]  HPI  Patient is a 63 yr old male who presents today for follow up.  HTN- maintained on losartan, metoprolol, amlodipine.  BP Readings from Last 3 Encounters:  03/30/19 111/66  06/30/18 128/66  04/10/18 136/65   Hyperlipidemia- maintained on pravastatin.   Lab Results  Component Value Date   CHOL 140 12/31/2018   HDL 36.40 (L) 12/31/2018   LDLCALC 80 12/31/2018   TRIG 119.0 12/31/2018   CHOLHDL 4 12/31/2018   GERD- continues omeprazole, reports symptoms remain well controlled.  Review of Systems See HPI  Past Medical History:  Diagnosis Date  . Anemia   . Asthma    uses inhaler  . Basal cell carcinoma 2017   left side of nose  . Borderline diabetes 05/03/2011  . Colon polyps   . Diverticulosis   . GERD (gastroesophageal reflux disease)    on meds  . Hx of adenomatous colonic polyps 03/07/2009  . Hx of eye surgery 2018   "Left Eye was swollen and had procedure to remove excess swelling"  . Hyperglycemia 05/01/2011  . Hyperlipidemia   . Hypertension   . Lactose intolerance      Social History   Socioeconomic History  . Marital status: Married    Spouse name: Not on file  . Number of children: Not on file  . Years of education: Not on file  . Highest education level: Not on file  Occupational History  . Not on file  Social Needs  . Financial resource strain: Not on file  . Food insecurity    Worry: Not on file    Inability: Not on file  . Transportation needs    Medical: Not on file    Non-medical: Not on file  Tobacco Use  . Smoking status: Former Smoker    Types: Cigarettes  . Smokeless tobacco: Never Used  Substance and Sexual Activity  . Alcohol use: No    Alcohol/week: 0.0 standard drinks  . Drug use: No  . Sexual activity: Not on file  Lifestyle  . Physical activity    Days per week: Not on file    Minutes per session: Not on file  .  Stress: Not on file  Relationships  . Social Herbalist on phone: Not on file    Gets together: Not on file    Attends religious service: Not on file    Active member of club or organization: Not on file    Attends meetings of clubs or organizations: Not on file    Relationship status: Not on file  . Intimate partner violence    Fear of current or ex partner: Not on file    Emotionally abused: Not on file    Physically abused: Not on file    Forced sexual activity: Not on file  Other Topics Concern  . 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 CYSTECTOMY  05/2004   removed fromback /sebaceous cyst  .  INGUINAL HERNIA REPAIR  1995   left  . mohs procedure  2017   left side of nose  . NASAL SEPTUM SURGERY      Family History  Problem Relation Age of Onset  . Coronary artery disease Father        CABG X5  . Diabetes Father   . Colon cancer Maternal Aunt   . Cancer Brother        lung (smoker)  . Hypertension Other   . Kidney disease Other     Allergies  Allergen Reactions  . Sulfonamide Derivatives     REACTION: Hallucination    Current Outpatient Medications on File Prior to Visit  Medication Sig Dispense Refill  . acetaminophen (TYLENOL) 500 MG tablet Take 1 tablet (500 mg total) by mouth every 6 (six) hours as needed. 30 tablet 5  . Albuterol Sulfate (PROAIR RESPICLICK) 123XX123 (90 Base) MCG/ACT AEPB Inhale 2 puffs into the lungs every 6 (six) hours as needed. 1 each 2  . amLODipine (NORVASC) 10 MG tablet TAKE 1 TABLET(10 MG) BY MOUTH DAILY 30 tablet 5  . aspirin 81 MG tablet Take 81 mg by mouth daily.      . cetirizine (ZYRTEC) 10 MG tablet Take  10 mg by mouth daily.      . fish oil-omega-3 fatty acids 1000 MG capsule Take 1,400 mg by mouth daily.    [name] guaiFENesin (MUCINEX) 600 MG 12 hr tablet Take 600 mg by mouth 2 (two) times daily as needed.    . hydrochlorothiazide (HYDRODIURIL) 12.5 MG tablet Take 1 tablet (12.5 mg total) by mouth daily. 30 tablet 5  . losartan (COZAAR) 100 MG tablet Take 1 tablet (100 mg total) by mouth daily. 30 tablet 5  . metoprolol succinate (TOPROL-XL) 25 MG 24 hr tablet TAKE 1 TABLET(25 MG) BY MOUTH DAILY 30 tablet 5  . omeprazole (PRILOSEC) 40 MG capsule TAKE 1 CAPSULE(40 MG) BY MOUTH DAILY 30 capsule 5  . pravastatin (PRAVACHOL) 40 MG tablet TAKE 1 TABLET(40 MG) BY MOUTH DAILY 30 tablet 5  . tamsulosin (FLOMAX) 0.4 MG CAPS capsule TAKE 1 CAPSULE(0.4 MG) BY MOUTH DAILY 30 capsule 5   No current facility-administered medications on file prior to visit.     BP 111/66 (BP Location: Right Arm, Patient Position: Sitting, Cuff Size: Small)   Pulse (!) 57   Temp (!) 97.1 F (36.2 C) (Temporal)   Resp 16   Ht 5\' 7"  (1.702 m)   Wt 186 lb (84.4 kg)   SpO2 97%   BMI 29.13 kg/m       Objective:   Physical Exam Constitutional:      General: He is not in acute distress.    Appearance: He is well-developed.  HENT:     Head: Normocephalic and atraumatic.  Cardiovascular:     Rate and Rhythm: Normal rate and regular rhythm.     Heart sounds: No murmur.  Pulmonary:     Effort: Pulmonary effort is normal. No respiratory distress.     Breath sounds: Normal breath sounds. No wheezing or rales.  Skin:    General: Skin is warm and dry.  Neurological:     Mental Status: He is alert and oriented to person, place, and time.  Psychiatric:        Behavior: Behavior normal.        Thought Content: Thought content normal.           Assessment & Plan:  HTN- bp appears  slightly overtreated. Advised pt as follows:  Please stop HCTZ. Check your blood pressure once daily for 1 week then send me your  readings via mychart.  Hyperlipidemia- stable on statin, continue same.   GERD- stable on PPI, continue same.  Requests Tdap- he is caring for his infant granddaughter. Flu shot today as well.

## 2019-03-30 NOTE — Patient Instructions (Signed)
Please stop HCTZ. Check your blood pressure once daily for 1 week then send me your readings via mychart.

## 2019-03-30 NOTE — Addendum Note (Signed)
Addended by: Jiles Prows on: 03/30/2019 09:53 AM   Modules accepted: Orders

## 2019-04-12 ENCOUNTER — Encounter: Payer: Self-pay | Admitting: Family

## 2019-05-31 ENCOUNTER — Encounter: Payer: Self-pay | Admitting: Family

## 2019-07-08 DIAGNOSIS — H524 Presbyopia: Secondary | ICD-10-CM | POA: Diagnosis not present

## 2019-08-04 ENCOUNTER — Other Ambulatory Visit: Payer: Self-pay

## 2019-08-04 MED ORDER — OMEPRAZOLE 40 MG PO CPDR: DELAYED_RELEASE_CAPSULE | ORAL | 1 refills | Status: DC

## 2019-08-04 MED ORDER — METOPROLOL SUCCINATE ER 25 MG PO TB24: ORAL_TABLET | ORAL | 1 refills | Status: DC

## 2019-08-10 ENCOUNTER — Encounter: Payer: Self-pay | Admitting: Family

## 2019-08-27 ENCOUNTER — Ambulatory Visit: Payer: Medicare Other | Attending: Internal Medicine

## 2019-08-27 DIAGNOSIS — Z23 Encounter for immunization: Secondary | ICD-10-CM

## 2019-08-27 NOTE — Progress Notes (Signed)
   Covid-19 Vaccination Clinic  Name:  Aaron Ruiz    MRN: [id number] DOB: 03/21/1956  08/27/2019  Aaron Ruiz was observed post Covid-19 immunization for 15 minutes without incidence. He was provided with Vaccine Information Sheet and instruction to access the V-Safe system.   Aaron Ruiz was instructed to call 911 with any severe reactions post vaccine: [name] Difficulty breathing  . Swelling of your face and throat  . A fast heartbeat  . A bad rash all over your body  . Dizziness and weakness    Immunizations Administered    Name Date Dose VIS Date Route   Pfizer COVID-19 Vaccine 08/27/2019  4:00 PM 0.3 mL 06/11/2019 Intramuscular   Manufacturer: [place]   Lot: [id number]   [place]: [id number]

## 2019-09-02 ENCOUNTER — Telehealth: Payer: Self-pay

## 2019-09-02 NOTE — Telephone Encounter (Signed)
Received fax from pharmacy for a refill for Flonase. Patient was last seen 06/30/2018. Patient will need an OV.

## 2019-09-22 ENCOUNTER — Ambulatory Visit: Payer: Medicare Other | Attending: Internal Medicine

## 2019-09-22 DIAGNOSIS — Z23 Encounter for immunization: Secondary | ICD-10-CM

## 2019-09-22 NOTE — Progress Notes (Signed)
   Covid-19 Vaccination Clinic  Name:  ROGERICK SCHWENT    MRN: [id number] DOB: [birth year]  09/22/2019  Mr. Husser was observed post Covid-19 immunization for 15 minutes without incident. He was provided with Vaccine Information Sheet and instruction to access the V-Safe system.   Mr. Steffek was instructed to call 911 with any severe reactions post vaccine: [name] Difficulty breathing  . Swelling of face and throat  . A fast heartbeat  . A bad rash all over body  . Dizziness and weakness   Immunizations Administered    Name Date Dose VIS Date Route   Pfizer COVID-19 Vaccine 09/22/2019  4:17 PM 0.3 mL 06/11/2019 Intramuscular   Manufacturer: [place]   Lot: [id number]   [place]: [id number]

## 2019-09-29 ENCOUNTER — Encounter: Payer: Self-pay | Admitting: Family

## 2019-09-29 MED ORDER — FLUTICASONE PROPIONATE 50 MCG/ACT NA SUSP: 2.0000 | Freq: Every day | NASAL | 6 refills | Status: DC

## 2019-12-06 ENCOUNTER — Other Ambulatory Visit: Payer: Self-pay

## 2019-12-06 MED ORDER — AMLODIPINE BESYLATE 10 MG PO TABS: ORAL_TABLET | ORAL | 0 refills | Status: DC

## 2019-12-21 ENCOUNTER — Other Ambulatory Visit: Payer: Self-pay | Admitting: Family

## 2019-12-21 NOTE — Telephone Encounter (Signed)
Medication:  amLODipine (NORVASC) 10 MG tablet   losartan (COZAAR) 100 MG tablet  tamsulosin (FLOMAX) 0.4 MG CAPS capsule      Has the patient contacted their pharmacy?  (If no, request that the patient contact the pharmacy for the refill.) (If yes, when and what did the pharmacy advise?)     Preferred Pharmacy (with phone number or street name): [id number] [address]  [place], [place] [address]  Phone:  [phone] Fax:  [phone]     Agent: Please be advised that RX refills may take up to 3 business days. We ask that you follow-up with your pharmacy.

## 2019-12-23 MED ORDER — LOSARTAN POTASSIUM 100 MG PO TABS: 100.0000 mg | ORAL_TABLET | Freq: Every day | ORAL | 1 refills | Status: DC

## 2019-12-23 MED ORDER — AMLODIPINE BESYLATE 10 MG PO TABS: ORAL_TABLET | ORAL | 0 refills | Status: DC

## 2019-12-23 MED ORDER — TAMSULOSIN HCL 0.4 MG PO CAPS: ORAL_CAPSULE | ORAL | 1 refills | Status: DC

## 2019-12-23 NOTE — Telephone Encounter (Signed)
Rx sent 

## 2020-01-11 ENCOUNTER — Other Ambulatory Visit: Payer: Self-pay

## 2020-01-11 ENCOUNTER — Ambulatory Visit (INDEPENDENT_AMBULATORY_CARE_PROVIDER_SITE_OTHER): Payer: Medicare Other | Admitting: Family

## 2020-01-11 ENCOUNTER — Encounter: Payer: Self-pay | Admitting: Family

## 2020-01-11 VITALS — BP 124/60 | HR 67 | Temp 98.8°F | Resp 18 | Ht 66.0 in | Wt 187.0 lb

## 2020-01-11 DIAGNOSIS — Z0000 Encounter for general adult medical examination without abnormal findings: Secondary | ICD-10-CM

## 2020-01-11 DIAGNOSIS — Z125 Encounter for screening for malignant neoplasm of prostate: Secondary | ICD-10-CM | POA: Diagnosis not present

## 2020-01-11 DIAGNOSIS — Z23 Encounter for immunization: Secondary | ICD-10-CM | POA: Diagnosis not present

## 2020-01-11 DIAGNOSIS — E781 Pure hyperglyceridemia: Secondary | ICD-10-CM | POA: Diagnosis not present

## 2020-01-11 DIAGNOSIS — R739 Hyperglycemia, unspecified: Secondary | ICD-10-CM | POA: Diagnosis not present

## 2020-01-11 LAB — LIPID PANEL
Cholesterol: 186 mg/dL (ref 0–200)
HDL: 36.4 mg/dL — ABNORMAL LOW (ref >39.00)
NonHDL: 149.42
Total CHOL/HDL Ratio: 5
Triglycerides: 210 mg/dL — ABNORMAL HIGH (ref 0.0–149.0)
VLDL: 42 mg/dL — ABNORMAL HIGH (ref 0.0–40.0)

## 2020-01-11 LAB — COMPREHENSIVE METABOLIC PANEL
ALT: 15 U/L (ref 0–53)
AST: 14 U/L (ref 0–37)
Albumin: 4.7 g/dL (ref 3.5–5.2)
Alkaline Phosphatase: 71 U/L (ref 39–117)
BUN: 20 mg/dL (ref 6–23)
CO2: 29 mEq/L (ref 19–32)
Calcium: 9.5 mg/dL (ref 8.4–10.5)
Chloride: 105 mEq/L (ref 96–112)
Creatinine, Ser: 0.86 mg/dL (ref 0.40–1.50)
GFR: 89.11 mL/min (ref >60.00)
Glucose, Bld: 94 mg/dL (ref 70–99)
Potassium: 4.5 mEq/L (ref 3.5–5.1)
Sodium: 140 mEq/L (ref 135–145)
Total Bilirubin: 0.3 mg/dL (ref 0.2–1.2)
Total Protein: 7 g/dL (ref 6.0–8.3)

## 2020-01-11 LAB — LDL CHOLESTEROL, DIRECT: Direct LDL: 118 mg/dL

## 2020-01-11 LAB — HEMOGLOBIN A1C: Hgb A1c MFr Bld: 5.8 % (ref 4.6–6.5)

## 2020-01-11 LAB — PSA: PSA: 0.99 ng/mL (ref 0.10–4.00)

## 2020-01-11 MED ORDER — SHINGRIX 50 MCG/0.5ML IM SUSR: INTRAMUSCULAR | 1 refills | Status: DC

## 2020-01-11 NOTE — Patient Instructions (Addendum)
Please schedule a routine dental visit.  You can get your shingrix vaccine from the pharmacist- rx has been sent. Continue the great work with healthy diet and regular exercise.

## 2020-01-11 NOTE — Progress Notes (Signed)
Subjective:    Patient ID: Aaron Ruiz, male    DOB: October 02, 1955, 64 y.o.   MRN: [id number]  HPI  Patient presents today for complete physical.  Immunizations:  Tetanus up to date, pfizer series complete Diet: healthy Wt Readings from Last 3 Encounters:  01/11/20 187 lb (84.8 kg)  03/30/19 186 lb (84.4 kg)  06/30/18 220 lb (99.8 kg)  Exercise: walks (847 miles since 7/1) Colonoscopy: 2016- due 2026 Vision:  Up to date Dental: due Lab Results  Component Value Date   PSA 0.64 12/31/2018   PSA 0.84 12/05/2017   PSA 0.84 11/22/2016   Lab Results  Component Value Date   HGBA1C 5.7 12/31/2018      Review of Systems  Constitutional: Negative for unexpected weight change.  HENT: Positive for hearing loss (plans to see audiology). Negative for rhinorrhea.   Eyes: Negative for visual disturbance.  Respiratory: Negative for cough and shortness of breath.   Cardiovascular: Negative for chest pain.  Gastrointestinal: Negative for constipation and diarrhea.  Genitourinary: Negative for difficulty urinating, dysuria and frequency.  Musculoskeletal: Negative for arthralgias and myalgias.  Skin: Negative for rash.  Neurological: Negative for headaches.  Hematological: Negative for adenopathy.  Psychiatric/Behavioral:       Denies depression/anxiety   Past Medical History:  Diagnosis Date  . Anemia   . Asthma    uses inhaler  . Basal cell carcinoma 2017   left side of nose  . Borderline diabetes 05/03/2011  . Colon polyps   . Diverticulosis   . GERD (gastroesophageal reflux disease)    on meds  . Hx of adenomatous colonic polyps 03/07/2009  . Hx of eye surgery 2018   "Left Eye was swollen and had procedure to remove excess swelling"  . Hyperglycemia 05/01/2011  . Hyperlipidemia   . Hypertension   . Lactose intolerance      Social History   Socioeconomic History  . Marital status: Married    Spouse name: Not on file  . Number of children: Not on file  . Years  of education: Not on file  . Highest education level: Not on file  Occupational History  . Not on file  Tobacco Use  . Smoking status: Former Smoker    Types: Cigarettes  . Smokeless tobacco: Never Used  Vaping Use  . Vaping Use: Never used  Substance and Sexual Activity  . Alcohol use: No    Alcohol/week: 0.0 standard drinks  . Drug use: No  . Sexual activity: Not on file  Other Topics Concern  . 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 Difficulty of Paying Living Expenses:   Food Insecurity:   . Worried About Charity fundraiser in the Last Year:   . Arboriculturist in the Last Year:   Transportation Needs:   . Film/video editor (Medical):   [name] Lack of Transportation (Non-Medical):   Physical Activity:   . Days of Exercise per Week:   . Minutes of Exercise per Session:   Stress:   . Feeling of  Stress :   Social Connections:   . Frequency of Communication with Friends and Family:   . Frequency of Social Gatherings with Friends and Family:   . Attends Religious Services:   . Active Member of Clubs or Organizations:   . Attends Archivist Meetings:   [name] Marital Status:   Intimate Partner Violence:   . Fear of Current or Ex-Partner:   . Emotionally Abused:   [name] Physically Abused:   . Sexually Abused:     Past Surgical History:  Procedure Laterality Date  . CYSTECTOMY  05/2004   removed fromback /sebaceous cyst  . [place]   left  . mohs procedure  2017   left side of nose  . NASAL SEPTUM SURGERY      Family History  Problem Relation Age of Onset  . Coronary artery disease Father        CABG X5  .  Diabetes Father   . Colon cancer Maternal Aunt   . Cancer Brother        lung (smoker)  . Hypertension Other   . Kidney disease Other     Allergies  Allergen Reactions  . Sulfonamide Derivatives     REACTION: Hallucination    Current Outpatient Medications on File Prior to Visit  Medication Sig Dispense Refill  . acetaminophen (TYLENOL) 500 MG tablet Take 1 tablet (500 mg total) by mouth every 6 (six) hours as needed. 30 tablet 5  . amLODipine (NORVASC) 10 MG tablet TAKE 1 TABLET(10 MG) BY MOUTH DAILY 30 tablet 0  . aspirin 81 MG tablet Take 81 mg by mouth daily.      . cetirizine (ZYRTEC) 10 MG tablet Take 10 mg by mouth daily.      . fluticasone (FLONASE) 50 MCG/ACT nasal spray Place 2 sprays into both nostrils daily. 16 g 6  . guaiFENesin (MUCINEX) 600 MG 12 hr tablet Take 600 mg by mouth 2 (two) times daily as needed.    [name] losartan (COZAAR) 100 MG tablet Take 1 tablet (100 mg total) by mouth daily. 90 tablet 1  . metoprolol succinate (TOPROL-XL) 25 MG 24 hr tablet TAKE 1 TABLET(25 MG) BY MOUTH DAILY 90 tablet 1  . omeprazole (PRILOSEC) 40 MG capsule TAKE 1 CAPSULE(40 MG) BY MOUTH DAILY 90 capsule 1  . tamsulosin (FLOMAX) 0.4 MG CAPS capsule TAKE 1 CAPSULE(0.4 MG) BY MOUTH DAILY 90 capsule 1  . Albuterol Sulfate (PROAIR RESPICLICK) 562 (90 Base) MCG/ACT AEPB Inhale 2 puffs into the lungs every 6 (six) hours as needed. (Patient not taking: Reported on 01/11/2020) 1 each 2  . pravastatin (PRAVACHOL) 40 MG tablet TAKE 1 TABLET(40 MG) BY MOUTH DAILY (Patient not taking: Reported on 01/11/2020) 90 tablet 1   No current facility-administered medications on file prior to visit.    BP 124/60 (BP Location: Right Arm, Patient Position: Sitting, Cuff Size: Normal)   Pulse 67   Temp 98.8 F (37.1 C) (Oral)   Resp 18   Ht 5\' 6"  (1.676 m)   Wt 187 lb (84.8 kg)   SpO2 98%   BMI 30.18 kg/m       Objective:   Physical Exam  Physical Exam  Constitutional: He is oriented to person,  place, and time. He appears well-developed and well-nourished. No distress.  HENT:  Head: Normocephalic and atraumatic.  Right Ear: Tympanic membrane and ear canal normal.  Left Ear: Tympanic membrane and ear canal normal.  Mouth/Throat: not examined- pt  wearing a mask.  Eyes: Pupils are equal, round, and reactive to light. No scleral icterus.  Neck: Normal range of motion. No thyromegaly present.  Cardiovascular: Normal rate and regular rhythm.   No murmur heard. Pulmonary/Chest: Effort normal and breath sounds normal. No respiratory distress. He has no wheezes. He has no rales. He exhibits no tenderness.  Abdominal: Soft. Bowel sounds are normal. He exhibits no distension and no mass. There is no tenderness. There is no rebound and no guarding.  Musculoskeletal: He exhibits no edema.  Lymphadenopathy:    He has no cervical adenopathy.  Neurological: He is alert and oriented to person, place, and time. He has normal patellar reflexes. He exhibits normal muscle tone. Coordination normal.  Skin: Skin is warm and dry.  Psychiatric: He has a normal mood and affect. His behavior is normal. Judgment and thought content normal.           Assessment & Plan:  Preventative care- encouraged patient to continue healthy diet, exercise and weight loss.  Check routine lab work including psa .  [place] up to date. Pneumovax 23 today- recommended shingrix vaccine from the pharmacy. Tetanus at the pharmacy.   This visit occurred during the SARS-CoV-2 public health emergency.  Safety protocols were in place, including screening questions prior to the visit, additional usage of staff PPE, and extensive cleaning of exam room while observing appropriate contact time as indicated for disinfecting solutions.           Assessment & Plan:

## 2020-01-13 ENCOUNTER — Other Ambulatory Visit: Payer: Self-pay | Admitting: *Deleted

## 2020-01-13 MED ORDER — PRAVASTATIN SODIUM 40 MG PO TABS: ORAL_TABLET | ORAL | 1 refills | Status: DC

## 2020-01-13 NOTE — Telephone Encounter (Signed)
Received request from [place], [place] for pravastatin. Refills sent.

## 2020-01-24 ENCOUNTER — Other Ambulatory Visit: Payer: Self-pay

## 2020-01-24 MED ORDER — AMLODIPINE BESYLATE 10 MG PO TABS: ORAL_TABLET | ORAL | 1 refills | Status: DC

## 2020-03-08 ENCOUNTER — Other Ambulatory Visit: Payer: Self-pay | Admitting: Family

## 2020-06-09 ENCOUNTER — Other Ambulatory Visit: Payer: Self-pay | Admitting: Family

## 2020-06-13 ENCOUNTER — Other Ambulatory Visit: Payer: Self-pay | Admitting: Family

## 2020-07-04 ENCOUNTER — Encounter: Payer: Self-pay | Admitting: Family

## 2020-07-04 DIAGNOSIS — H9190 Unspecified hearing loss, unspecified ear: Secondary | ICD-10-CM

## 2020-07-16 ENCOUNTER — Other Ambulatory Visit: Payer: Self-pay | Admitting: Family

## 2020-08-01 DIAGNOSIS — H903 Sensorineural hearing loss, bilateral: Secondary | ICD-10-CM | POA: Diagnosis not present

## 2020-09-09 ENCOUNTER — Other Ambulatory Visit: Payer: Self-pay | Admitting: Family

## 2020-09-11 ENCOUNTER — Encounter: Payer: Self-pay | Admitting: Family

## 2020-09-11 ENCOUNTER — Ambulatory Visit (INDEPENDENT_AMBULATORY_CARE_PROVIDER_SITE_OTHER): Payer: Medicare Other | Admitting: Family

## 2020-09-11 ENCOUNTER — Other Ambulatory Visit: Payer: Self-pay

## 2020-09-11 VITALS — BP 153/68 | HR 75 | Temp 98.7°F | Resp 16 | Ht 67.0 in | Wt 202.0 lb

## 2020-09-11 DIAGNOSIS — R002 Palpitations: Secondary | ICD-10-CM | POA: Diagnosis not present

## 2020-09-11 DIAGNOSIS — K219 Gastro-esophageal reflux disease without esophagitis: Secondary | ICD-10-CM

## 2020-09-11 DIAGNOSIS — R001 Bradycardia, unspecified: Secondary | ICD-10-CM

## 2020-09-11 DIAGNOSIS — J3089 Other allergic rhinitis: Secondary | ICD-10-CM

## 2020-09-11 DIAGNOSIS — E781 Pure hyperglyceridemia: Secondary | ICD-10-CM

## 2020-09-11 DIAGNOSIS — I10 Essential (primary) hypertension: Secondary | ICD-10-CM | POA: Diagnosis not present

## 2020-09-11 DIAGNOSIS — N400 Enlarged prostate without lower urinary tract symptoms: Secondary | ICD-10-CM

## 2020-09-11 MED ORDER — PRAVASTATIN SODIUM 40 MG PO TABS: 40.0000 mg | ORAL_TABLET | Freq: Every day | ORAL | 1 refills | Status: DC

## 2020-09-11 MED ORDER — HYDROCHLOROTHIAZIDE 25 MG PO TABS: 25.0000 mg | ORAL_TABLET | Freq: Every day | ORAL | 0 refills | Status: DC

## 2020-09-11 NOTE — Patient Instructions (Signed)
Stop metoprolol.  Start hctz once daily (fluid pill- take in the AM). You should be contacted about your referral to cardiology.

## 2020-09-11 NOTE — Progress Notes (Signed)
Subjective:    Patient ID: Aaron Ruiz, male    DOB: 04/30/1956, 65 y.o.   MRN: [id number]  HPI   Patient is a 65 yr old male who presents today for follow up, but also has complaint of  palpitations.  HTN- maintained on amlodipine 10mg , losartan 100mg , toprol xl 25mg . Reports pulse 38-42 bpm last week.  BP Readings from Last 3 Encounters:  09/11/20 (!) 153/68  01/11/20 124/60  03/30/19 111/66    GERD- maintained on omeprazole 40mg .  BPH- maintained on flomax.   Allergic rhinitis- using flonase.  Helpful.    Hyperlipidemia- on pravastatin 40mg .   Lab Results  Component Value Date   CHOL 186 01/11/2020   HDL 36.40 (L) 01/11/2020   LDLCALC 80 12/31/2018   LDLDIRECT 118.0 01/11/2020   TRIG 210.0 (H) 01/11/2020   CHOLHDL 5 01/11/2020   Has had some low back pain. Using icy hot on his lower back.  No recent pain.   Review of Systems See HPI  Past Medical History:  Diagnosis Date  . Anemia   . Asthma    uses inhaler  . Basal cell carcinoma 2017   left side of nose  . Borderline diabetes 05/03/2011  . Colon polyps   . Diverticulosis   . GERD (gastroesophageal reflux disease)    on meds  . Hx of adenomatous colonic polyps 03/07/2009  . Hx of eye surgery 2018   "Left Eye was swollen and had procedure to remove excess swelling"  . Hyperglycemia 05/01/2011  . Hyperlipidemia   . Hypertension   . Lactose intolerance      Social History   Socioeconomic History  . Marital status: Married    Spouse name: Not on file  . Number of children: Not on file  . Years of education: Not on file  . Highest education level: Not on file  Occupational History  . Not on file  Tobacco Use  . Smoking status: Former Smoker    Types: Cigarettes  . Smokeless tobacco: Never Used  Vaping Use  . Vaping Use: Never used  Substance and Sexual Activity  . Alcohol use: No    Alcohol/week: 0.0 standard drinks  . Drug use: No  . Sexual activity: Not on file  Other Topics Concern   . 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 CYSTECTOMY  05/2004   removed fromback /sebaceous cyst  . [place]   left  . mohs procedure  2017   left side of nose  . NASAL SEPTUM SURGERY      Family History  Problem Relation Age of Onset  . Coronary artery disease Father        CABG X5  . Diabetes Father   . Colon cancer Maternal Aunt   . Cancer Brother  lung (smoker)  . Hypertension Other   . Kidney disease Other     Allergies  Allergen Reactions  . Sulfonamide Derivatives     REACTION: Hallucination    Current Outpatient Medications on File Prior to Visit  Medication Sig Dispense Refill  . acetaminophen (TYLENOL) 500 MG tablet Take 1 tablet (500 mg total) by mouth every 6 (six) hours as needed. 30 tablet 5  . amLODipine (NORVASC) 10 MG tablet TAKE 1 TABLET(10 MG) BY MOUTH DAILY 90 tablet 1  . aspirin 81 MG tablet Take 81 mg by mouth daily.      . cetirizine (ZYRTEC) 10 MG tablet Take 10 mg by mouth daily.      . fluticasone (FLONASE) 50 MCG/ACT nasal spray Place 2 sprays into both nostrils daily. 16 g 6  . guaiFENesin (MUCINEX) 600 MG 12 hr tablet Take 600 mg by mouth 2 (two) times daily as needed.    [name] losartan (COZAAR) 100 MG tablet TAKE 1 TABLET(100 MG) BY MOUTH DAILY 90 tablet 1  . metoprolol succinate (TOPROL-XL) 25 MG 24 hr tablet TAKE 1 TABLET(25 MG) BY MOUTH DAILY 90 tablet 1  . omeprazole (PRILOSEC)  40 MG capsule TAKE 1 CAPSULE(40 MG) BY MOUTH DAILY 90 capsule 1  . tamsulosin (FLOMAX) 0.4 MG CAPS capsule TAKE 1 CAPSULE(0.4 MG) BY MOUTH DAILY 90 capsule 1  . Zoster Vaccine Adjuvanted [facility]) injection Inject 0.5mg  IM now and again in 2-6 months. 0.5 mL 1   No current facility-administered medications on file prior to visit.    BP (!) 153/68 (BP Location: Right Arm, Patient Position: Sitting, Cuff Size: Small)   Pulse 75   Temp 98.7 F (37.1 C) (Oral)   Resp 16   Ht 5\' 7"  (1.702 m)   Wt 202 lb (91.6 kg)   BMI 31.64 kg/m       Objective:   Physical Exam Constitutional:      General: He is not in acute distress.    Appearance: He is well-developed.  HENT:     Head: Normocephalic and atraumatic.  Cardiovascular:     Rate and Rhythm: Normal rate and regular rhythm.     Heart sounds: No murmur heard.   Pulmonary:     Effort: Pulmonary effort is normal. No respiratory distress.     Breath sounds: Normal breath sounds. No wheezing or rales.  Skin:    General: Skin is warm and dry.  Neurological:     Mental Status: He is alert and oriented to person, place, and time.  Psychiatric:        Behavior: Behavior normal.        Thought Content: Thought content normal.           Assessment & Plan:  Palpitations/bradycardia- EKG tracing is personally reviewed.  EKG notes NSR.  No acute changes. Will refer to cardiology and d/c metoprolol as this may be contributing to his reported bradycardia. HR looks ok today in the office.   HTN- d/c metoprolol, start hctz 25mg  once daily. Continue amlodipine 10mg  and losartan 100mg .   GERD- stable. Continue omeprazole 40 mg.   Hypertriglyceridemia- tolerating pravastatin 40mg  once daily. Check lipid panel.   BPH- stable on flomax 0.4mg  PO daily. Continue same.   Allergic rhinitis- stable on flonase. Continue same.  This visit occurred during the SARS-CoV-2 public health emergency.  Safety protocols were in place, including  screening questions prior to the visit, additional usage of staff PPE, and extensive cleaning of exam room  while observing appropriate contact time as indicated for disinfecting solutions.

## 2020-09-12 ENCOUNTER — Other Ambulatory Visit (INDEPENDENT_AMBULATORY_CARE_PROVIDER_SITE_OTHER): Payer: Medicare Other

## 2020-09-12 DIAGNOSIS — R739 Hyperglycemia, unspecified: Secondary | ICD-10-CM | POA: Diagnosis not present

## 2020-09-12 LAB — COMPREHENSIVE METABOLIC PANEL
ALT: 18 U/L (ref 0–53)
AST: 15 U/L (ref 0–37)
Albumin: 4.4 g/dL (ref 3.5–5.2)
Alkaline Phosphatase: 73 U/L (ref 39–117)
BUN: 13 mg/dL (ref 6–23)
CO2: 27 mEq/L (ref 19–32)
Calcium: 9.6 mg/dL (ref 8.4–10.5)
Chloride: 104 mEq/L (ref 96–112)
Creatinine, Ser: 0.8 mg/dL (ref 0.40–1.50)
GFR: 92.44 mL/min (ref >60.00)
Glucose, Bld: 122 mg/dL — ABNORMAL HIGH (ref 70–99)
Potassium: 4.2 mEq/L (ref 3.5–5.1)
Sodium: 141 mEq/L (ref 135–145)
Total Bilirubin: 0.4 mg/dL (ref 0.2–1.2)
Total Protein: 6.9 g/dL (ref 6.0–8.3)

## 2020-09-12 LAB — CBC WITH DIFFERENTIAL/PLATELET
Basophils Absolute: 0.1 K/uL (ref 0.0–0.1)
Basophils Relative: 1 % (ref 0.0–3.0)
Eosinophils Absolute: 0.1 K/uL (ref 0.0–0.7)
Eosinophils Relative: 2.3 % (ref 0.0–5.0)
HCT: 42.7 % (ref 39.0–52.0)
Hemoglobin: 14.6 g/dL (ref 13.0–17.0)
Lymphocytes Relative: 29.3 % (ref 12.0–46.0)
Lymphs Abs: 1.5 K/uL (ref 0.7–4.0)
MCHC: 34.1 g/dL (ref 30.0–36.0)
MCV: 86.8 fl (ref 78.0–100.0)
Monocytes Absolute: 0.4 K/uL (ref 0.1–1.0)
Monocytes Relative: 7.9 % (ref 3.0–12.0)
Neutro Abs: 3.1 K/uL (ref 1.4–7.7)
Neutrophils Relative %: 59.5 % (ref 43.0–77.0)
Platelets: 145 K/uL — ABNORMAL LOW (ref 150.0–400.0)
RBC: 4.92 Mil/uL (ref 4.22–5.81)
RDW: 13.8 % (ref 11.5–15.5)
WBC: 5.3 K/uL (ref 4.0–10.5)

## 2020-09-12 LAB — HEMOGLOBIN A1C: Hgb A1c MFr Bld: 6 % (ref 4.6–6.5)

## 2020-09-12 LAB — LIPID PANEL
Cholesterol: 188 mg/dL (ref 0–200)
HDL: 40 mg/dL
NonHDL: 147.86
Total CHOL/HDL Ratio: 5
Triglycerides: 378 mg/dL — ABNORMAL HIGH (ref 0.0–149.0)
VLDL: 75.6 mg/dL — ABNORMAL HIGH (ref 0.0–40.0)

## 2020-09-12 LAB — LDL CHOLESTEROL, DIRECT: Direct LDL: 116 mg/dL

## 2020-09-12 LAB — TSH: TSH: 1.82 u[IU]/mL (ref 0.35–4.50)

## 2020-09-15 ENCOUNTER — Encounter: Payer: Self-pay | Admitting: Family

## 2020-09-19 DIAGNOSIS — D649 Anemia, unspecified: Secondary | ICD-10-CM | POA: Insufficient documentation

## 2020-09-19 DIAGNOSIS — I10 Essential (primary) hypertension: Secondary | ICD-10-CM | POA: Insufficient documentation

## 2020-09-19 DIAGNOSIS — K5790 Diverticulosis of intestine, part unspecified, without perforation or abscess without bleeding: Secondary | ICD-10-CM | POA: Insufficient documentation

## 2020-09-19 DIAGNOSIS — K635 Polyp of colon: Secondary | ICD-10-CM | POA: Insufficient documentation

## 2020-09-19 DIAGNOSIS — E739 Lactose intolerance, unspecified: Secondary | ICD-10-CM | POA: Insufficient documentation

## 2020-09-19 DIAGNOSIS — K219 Gastro-esophageal reflux disease without esophagitis: Secondary | ICD-10-CM | POA: Insufficient documentation

## 2020-09-21 ENCOUNTER — Encounter: Payer: Self-pay | Admitting: Cardiology

## 2020-09-21 ENCOUNTER — Ambulatory Visit (INDEPENDENT_AMBULATORY_CARE_PROVIDER_SITE_OTHER): Payer: Medicare Other

## 2020-09-21 ENCOUNTER — Ambulatory Visit: Payer: Medicare Other | Admitting: Cardiology

## 2020-09-21 ENCOUNTER — Other Ambulatory Visit: Payer: Self-pay

## 2020-09-21 VITALS — BP 136/70 | HR 92 | Ht 67.0 in | Wt 200.0 lb

## 2020-09-21 DIAGNOSIS — R5383 Other fatigue: Secondary | ICD-10-CM

## 2020-09-21 DIAGNOSIS — R002 Palpitations: Secondary | ICD-10-CM | POA: Insufficient documentation

## 2020-09-21 DIAGNOSIS — Z87891 Personal history of nicotine dependence: Secondary | ICD-10-CM

## 2020-09-21 DIAGNOSIS — E559 Vitamin D deficiency, unspecified: Secondary | ICD-10-CM | POA: Diagnosis not present

## 2020-09-21 DIAGNOSIS — E669 Obesity, unspecified: Secondary | ICD-10-CM

## 2020-09-21 DIAGNOSIS — R0683 Snoring: Secondary | ICD-10-CM | POA: Insufficient documentation

## 2020-09-21 DIAGNOSIS — R400 Somnolence: Secondary | ICD-10-CM

## 2020-09-21 HISTORY — DX: Other fatigue: R53.83

## 2020-09-21 HISTORY — DX: Vitamin D deficiency, unspecified: E55.9

## 2020-09-21 HISTORY — DX: Somnolence: R40.0

## 2020-09-21 HISTORY — DX: Palpitations: R00.2

## 2020-09-21 HISTORY — DX: Obesity, unspecified: E66.9

## 2020-09-21 HISTORY — DX: Personal history of nicotine dependence: Z87.891

## 2020-09-21 HISTORY — DX: Snoring: R06.83

## 2020-09-21 NOTE — Patient Instructions (Addendum)
Medication Instructions:  Your physician recommends that you continue on your current medications as directed. Please refer to the Current Medication list given to you today.  *If you need a refill on your cardiac medications before your next appointment, please call your pharmacy*   Lab Work: Your physician recommends that you return for lab work: TODAY: Vitamin D If you have labs (blood work) drawn today and your tests are completely normal, you will receive your results only by: [name] MyChart Message (if you have MyChart) OR . A paper copy in the mail If you have any lab test that is abnormal or we need to change your treatment, we will call you to review the results.   Testing/Procedures: Your physician has requested that you have an echocardiogram. Echocardiography is a painless test that uses sound waves to create images of your heart. It provides your doctor with information about the size and shape of your heart and how well your heart's chambers and valves are working. This procedure takes approximately one hour. There are no restrictions for this procedure.  A zio monitor was ordered today. It will remain on for 14 days. You will then return monitor and event diary in provided box. It takes 1-2 weeks for report to be downloaded and returned to [place]. We will call you with the results. If monitor falls off or has orange flashing light, please call Zio for further instructions.   Your physician has requested that you have an abdominal aorta duplex. During this test, an ultrasound is used to evaluate the aorta. Allow 30 minutes for this exam. Do not eat after midnight the day before and avoid carbonated beverages   Follow-Up: At [facility], you and your health needs are our priority.  As part of our continuing mission to provide you with exceptional heart care, we have created designated Provider Care Teams.  These Care Teams include your primary Cardiologist (physician) and Advanced  Practice Providers (APPs -  Physician Assistants and Nurse Practitioners) who all work together to provide you with the care you need, when you need it.  We recommend signing up for the patient portal called "MyChart".  Sign up information is provided on this After Visit Summary.  MyChart is used to connect with patients for Virtual Visits (Telemedicine).  Patients are able to view lab/test results, encounter notes, upcoming appointments, etc.  Non-urgent messages can be sent to your provider as well.   To learn more about what you can do with MyChart, go to [email or web address].    Your next appointment:   8 week(s)  The format for your next appointment:   In Person  Provider:   Berniece Salines, DO   Other Instructions  Abdominal or Pelvic Ultrasound An ultrasound is a test that uses sound waves to take pictures of the inside of the body. An abdominal ultrasound takes pictures of the inside of your belly (abdomen). A pelvic ultrasound takes pictures of the inside of the area between your hip bones (pelvis). An ultrasound may be done to check an organ or look for problems. This is a safe test that does not hurt. It is done by placing a handheld device called a transducer on the outside of your belly or pelvis and moving it around. Tell your doctor about:  Any allergies you have.  All medicines you are taking.  Any surgeries you have had.  Any medical conditions you have.  Whether you are pregnant or may be pregnant. What are the risks?  There are no known risks from having this test. What happens before the procedure?  Follow instructions from your doctor about eating or drinking before the test.  Wear clothing that is easy to wash. Gel from the test might get on your clothes. What happens during the procedure?  You will lie on an exam table.  Your clothes will be moved so your belly and pelvis are showing.  A gel will be put on your skin. It may feel cool.  The transducer  device will be put on your skin. It will be moved back and forth over the area being looked at.  The device will take pictures. They will show on small TV screens.  You may be asked to change your position.  After the exam, the gel will be cleaned off.   What happens after the procedure?  It is up to you to get the results of your test. Ask your doctor, or the department that is doing the test, when your results will be ready.  Keep all follow-up visits as told by your doctor. This is important. Summary  An ultrasound is a test that uses sound waves to take pictures of the inside of the body.  An ultrasound may be done to check an organ or look for problems.  The test is done by moving a handheld device around on the outside of your belly or pelvis.  It is up to you to get the results of your test. Be sure to ask when your results will be ready. This information is not intended to replace advice given to you by your health care provider. Make sure you discuss any questions you have with your health care provider. Document Revised: 01/12/2018 Document Reviewed: 01/12/2018 Elsevier Patient Education  2021 [employer].  Echocardiogram An echocardiogram is a test that uses sound waves (ultrasound) to produce images of the heart. Images from an echocardiogram can provide important information about:  Heart size and shape.  The size and thickness and movement of your heart's walls.  Heart muscle function and strength.  Heart valve function or if you have stenosis. Stenosis is when the heart valves are too narrow.  If blood is flowing backward through the heart valves (regurgitation).  A tumor or infectious growth around the heart valves.  Areas of heart muscle that are not working well because of poor blood flow or injury from a heart attack.  Aneurysm detection. An aneurysm is a weak or damaged part of an artery wall. The wall bulges out from the normal force of blood pumping  through the body. Tell a health care provider about:  Any allergies you have.  All medicines you are taking, including vitamins, herbs, eye drops, creams, and over-the-counter medicines.  Any blood disorders you have.  Any surgeries you have had.  Any medical conditions you have.  Whether you are pregnant or may be pregnant. What are the risks? Generally, this is a safe test. However, problems may occur, including an allergic reaction to dye (contrast) that may be used during the test. What happens before the test? No specific preparation is needed. You may eat and drink normally. What happens during the test?  You will take off your clothes from the waist up and put on a hospital gown.  Electrodes or electrocardiogram (ECG)patches may be placed on your chest. The electrodes or patches are then connected to a device that monitors your heart rate and rhythm.  You will lie down on a  table for an ultrasound exam. A gel will be applied to your chest to help sound waves pass through your skin.  A handheld device, called a transducer, will be pressed against your chest and moved over your heart. The transducer produces sound waves that travel to your heart and bounce back (or "echo" back) to the transducer. These sound waves will be captured in real-time and changed into images of your heart that can be viewed on a video monitor. The images will be recorded on a computer and reviewed by your health care provider.  You may be asked to change positions or hold your breath for a short time. This makes it easier to get different views or better views of your heart.  In some cases, you may receive contrast through an IV in one of your veins. This can improve the quality of the pictures from your heart. The procedure may vary among health care providers and hospitals.   What can I expect after the test? You may return to your normal, everyday life, including diet, activities, and medicines,  unless your health care provider tells you not to do that. Follow these instructions at home:  It is up to you to get the results of your test. Ask your health care provider, or the department that is doing the test, when your results will be ready.  Keep all follow-up visits. This is important. Summary  An echocardiogram is a test that uses sound waves (ultrasound) to produce images of the heart.  Images from an echocardiogram can provide important information about the size and shape of your heart, heart muscle function, heart valve function, and other possible heart problems.  You do not need to do anything to prepare before this test. You may eat and drink normally.  After the echocardiogram is completed, you may return to your normal, everyday life, unless your health care provider tells you not to do that. This information is not intended to replace advice given to you by your health care provider. Make sure you discuss any questions you have with your health care provider. Document Revised: 02/08/2020 Document Reviewed: 02/08/2020 Elsevier Patient Education  2021 [employer].

## 2020-09-21 NOTE — Progress Notes (Signed)
Cardiology Office Note:    Date:  09/21/2020   ID:  Aaron Ruiz, DOB 24-Aug-1955, MRN [id number]  PCP:  Aaron Alar, NP  Cardiologist:  Aaron Salines, DO  Electrophysiologist:  None   Referring MD: Aaron Alar, NP   I have been very fatigued recently.  History of Present Illness:    Aaron Ruiz is a 65 y.o. male with a hx of hypertension, hyperlipidemia obesity is here today as he has been experiencing irregular heartbeat as well as fatigue.  The patient tells me that he is worry because of the last few months he has had a problem where he feels his heart rate is going down to the 40s and he feels that it is irregular.  He recently saw his PCP who took him off the beta-blocker given his heart rates dropping.  But another problem he reports is easy significantly fatigue.  He never used to be thisTired.  He also has daytime somnolence and he snores.  He denies any chest discomfort or any shortness of breath.  Past Medical History:  Diagnosis Date  . Anemia   . Asthma    uses inhaler  . Basal cell carcinoma 2017   left side of nose  . Borderline diabetes 05/03/2011  . Colon polyps   . Diverticulosis   . GERD (gastroesophageal reflux disease)    on meds  . Hx of adenomatous colonic polyps 03/07/2009  . Hx of eye surgery 2018   "Left Eye was swollen and had procedure to remove excess swelling"  . Hyperglycemia 05/01/2011  . Hyperlipidemia   . Hypertension   . Lactose intolerance     Past Surgical History:  Procedure Laterality Date  . CYSTECTOMY  05/2004   removed fromback /sebaceous cyst  . [place]   left  . mohs procedure  2017   left side of nose  . NASAL SEPTUM SURGERY      Current Medications: Current Meds  Medication Sig  . acetaminophen (TYLENOL) 500 MG tablet Take 1 tablet (500 mg total) by mouth every 6 (six) hours as needed.  [name] amLODipine (NORVASC) 10 MG tablet TAKE 1 TABLET(10 MG) BY MOUTH DAILY  . aspirin 81 MG  tablet Take 81 mg by mouth daily.  . cetirizine (ZYRTEC) 10 MG tablet Take 10 mg by mouth daily.  . fluticasone (FLONASE) 50 MCG/ACT nasal spray Place 2 sprays into both nostrils daily.  [name] guaiFENesin (MUCINEX) 600 MG 12 hr tablet Take 600 mg by mouth 2 (two) times daily as needed.  . hydrochlorothiazide (HYDRODIURIL) 25 MG tablet Take 1 tablet (25 mg total) by mouth daily.  [name] losartan (COZAAR) 100 MG tablet TAKE 1 TABLET(100 MG) BY MOUTH DAILY  . omeprazole (PRILOSEC) 40 MG capsule TAKE 1 CAPSULE(40 MG) BY MOUTH DAILY  . pravastatin (PRAVACHOL) 40 MG tablet Take 1 tablet (40 mg total) by mouth daily.  . tamsulosin (FLOMAX) 0.4 MG CAPS capsule TAKE 1 CAPSULE(0.4 MG) BY MOUTH DAILY  . Zoster Vaccine Adjuvanted [facility]) injection Inject 0.5mg  IM now and again in 2-6 months.     Allergies:   Sulfonamide derivatives   Social History   Socioeconomic History  . Marital status: Married    Spouse name: Not on file  . Number of children: Not on file  . Years of education: Not on file  . Highest education level: Not on file  Occupational History  . Not on file  Tobacco Use  . Smoking status: Former Smoker  Types: Cigarettes  . Smokeless tobacco: Never Used  Vaping Use  . Vaping Use: Never used  Substance and Sexual Activity  . Alcohol use: No    Alcohol/week: 0.0 standard drinks  . Drug use: No  . Sexual activity: Not on file  Other Topics Concern  . 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[employer])   Married 2 years    1  daughter 4 - [name] (lives in [place])   1  son 3    Retired   Alcohol use-no   Smoking Status:  quit   Caffeine use/day:  2-3 beverages daily   Social Determinants of Radio broadcast assistant Strain: Not on file  Food Insecurity: Not on file  Transportation Needs: Not on file  Physical Activity: Not on file  Stress: Not on file  Social Connections: Not on file     Family History: The patient's family history  includes Cancer in his brother; Colon cancer in his maternal aunt; Coronary artery disease in his father; Diabetes in his father; Hypertension in an other family member; Kidney disease in an other family member.  ROS:   Review of Systems  Constitution: Negative for decreased appetite, fever and weight gain.  HENT: Negative for congestion, ear discharge, hoarse voice and sore throat.   Eyes: Negative for discharge, redness, vision loss in right eye and visual halos.  Cardiovascular: Negative for chest pain, dyspnea on exertion, leg swelling, orthopnea and palpitations.  Respiratory: Negative for cough, hemoptysis, shortness of breath and snoring.   Endocrine: Negative for heat intolerance and polyphagia.  Hematologic/Lymphatic: Negative for bleeding problem. Does not bruise/bleed easily.  Skin: Negative for flushing, nail changes, rash and suspicious lesions.  Musculoskeletal: Negative for arthritis, joint pain, muscle cramps, myalgias, neck pain and stiffness.  Gastrointestinal: Negative for abdominal pain, bowel incontinence, diarrhea and excessive appetite.  Genitourinary: Negative for decreased libido, genital sores and incomplete emptying.  Neurological: Negative for brief paralysis, focal weakness, headaches and loss of balance.  Psychiatric/Behavioral: Negative for altered mental status, depression and suicidal ideas.  Allergic/Immunologic: Negative for HIV exposure and persistent infections.    EKGs/Labs/Other Studies Reviewed:    The following studies were reviewed today:   EKG:  The ekg ordered today demonstrates sinus rhythm, heart rate 92 bpm with no evidence of ST segment changes.    Recent Labs: 09/11/2020: ALT 18; BUN 13; Creatinine, Ser 0.80; Hemoglobin 14.6; Platelets 145.0; Potassium 4.2; Sodium 141; TSH 1.82  Recent Lipid Panel    Component Value Date/Time   CHOL 188 09/11/2020 1232   TRIG 378.0 (H) 09/11/2020 1232   HDL 40.00 09/11/2020 1232   CHOLHDL 5  09/11/2020 1232   VLDL 75.6 (H) 09/11/2020 1232   LDLCALC 80 12/31/2018 0901   LDLDIRECT 116.0 09/11/2020 1232    Physical Exam:    VS:  BP 136/70 (BP Location: Right Arm)   Pulse 92   Ht 5\' 7"  (1.702 m)   Wt 200 lb (90.7 kg)   SpO2 97%   BMI 31.32 kg/m     Wt Readings from Last 3 Encounters:  09/21/20 200 lb (90.7 kg)  09/11/20 202 lb (91.6 kg)  01/11/20 187 lb (84.8 kg)     GEN: Well nourished, well developed in no acute distress HEENT: Normal NECK: No JVD; No carotid bruits LYMPHATICS: No lymphadenopathy CARDIAC: S1S2 noted,RRR, no murmurs, rubs, gallops RESPIRATORY:  Clear to auscultation without rales, wheezing or rhonchi  ABDOMEN: Soft,  non-tender, non-distended, +bowel sounds, no guarding. EXTREMITIES: No edema, No cyanosis, no clubbing MUSCULOSKELETAL:  No deformity  SKIN: Warm and dry NEUROLOGIC:  Alert and oriented x 3, non-focal PSYCHIATRIC:  Normal affect, good insight  ASSESSMENT:    1. Palpitations   2. Daytime somnolence   3. Fatigue, unspecified type   4. Snoring   5. Former smoker   6. Vitamin D deficiency   7. Obesity (BMI 30-39.9)    PLAN:     I would like to rule out a cardiovascular etiology of this palpitation, therefore at this time I would like to placed a zio patch for 14 days.   In additon his significant fatigue a transthoracic echocardiogram will be ordered to assess LV/RV function and any structural abnormalities.  Vitamin D levels will also be done.  His fatigue/daytime somnolence/snoring and neck morphology suggest sleep apnea in this patient.  I have talked to the patient about getting a sleep study but he has declined this.  He prefers not to get a sleep study.  Explained to him the detrimental effects of sleep apnea if not being treated for now he plans to wait.  The patient understands the need to lose weight with diet and exercise. We have discussed specific strategies for this.  He is a former smoker and of age we will do  AAA screening.  The patient is in agreement with the above plan. The patient left the office in stable condition.  The patient will follow up in 8 weeks or sooner if needed.   Medication Adjustments/Labs and Tests Ordered: Current medicines are reviewed at length with the patient today.  Concerns regarding medicines are outlined above.  Orders Placed This Encounter  Procedures  . VITAMIN D 25 Hydroxy (Vit-D Deficiency, Fractures)  . LONG TERM MONITOR (3-14 DAYS)  . EKG 12-Lead  . ECHOCARDIOGRAM COMPLETE  . VAS [place] AAA DUPLEX   No orders of the defined types were placed in this encounter.   Patient Instructions   Medication Instructions:  Your physician recommends that you continue on your current medications as directed. Please refer to the Current Medication list given to you today.  *If you need a refill on your cardiac medications before your next appointment, please call your pharmacy*   Lab Work: Your physician recommends that you return for lab work: TODAY: Vitamin D If you have labs (blood work) drawn today and your tests are completely normal, you will receive your results only by: [name] MyChart Message (if you have MyChart) OR . A paper copy in the mail If you have any lab test that is abnormal or we need to change your treatment, we will call you to review the results.   Testing/Procedures: Your physician has requested that you have an echocardiogram. Echocardiography is a painless test that uses sound waves to create images of your heart. It provides your doctor with information about the size and shape of your heart and how well your heart's chambers and valves are working. This procedure takes approximately one hour. There are no restrictions for this procedure.  A zio monitor was ordered today. It will remain on for 14 days. You will then return monitor and event diary in provided box. It takes 1-2 weeks for report to be downloaded and returned to [place]. We will call you  with the results. If monitor falls off or has orange flashing light, please call Zio for further instructions.   Your physician has requested that you have an abdominal  aorta duplex. During this test, an ultrasound is used to evaluate the aorta. Allow 30 minutes for this exam. Do not eat after midnight the day before and avoid carbonated beverages   Follow-Up: At [facility], you and your health needs are our priority.  As part of our continuing mission to provide you with exceptional heart care, we have created designated Provider Care Teams.  These Care Teams include your primary Cardiologist (physician) and Advanced Practice Providers (APPs -  Physician Assistants and Nurse Practitioners) who all work together to provide you with the care you need, when you need it.  We recommend signing up for the patient portal called "MyChart".  Sign up information is provided on this After Visit Summary.  MyChart is used to connect with patients for Virtual Visits (Telemedicine).  Patients are able to view lab/test results, encounter notes, upcoming appointments, etc.  Non-urgent messages can be sent to your provider as well.   To learn more about what you can do with MyChart, go to [email or web address].    Your next appointment:   8 week(s)  The format for your next appointment:   In Person  Provider:   Berniece Salines, DO   Other Instructions  Abdominal or Pelvic Ultrasound An ultrasound is a test that uses sound waves to take pictures of the inside of the body. An abdominal ultrasound takes pictures of the inside of your belly (abdomen). A pelvic ultrasound takes pictures of the inside of the area between your hip bones (pelvis). An ultrasound may be done to check an organ or look for problems. This is a safe test that does not hurt. It is done by placing a handheld device called a transducer on the outside of your belly or pelvis and moving it around. Tell your doctor about:  Any allergies  you have.  All medicines you are taking.  Any surgeries you have had.  Any medical conditions you have.  Whether you are pregnant or may be pregnant. What are the risks? There are no known risks from having this test. What happens before the procedure?  Follow instructions from your doctor about eating or drinking before the test.  Wear clothing that is easy to wash. Gel from the test might get on your clothes. What happens during the procedure?  You will lie on an exam table.  Your clothes will be moved so your belly and pelvis are showing.  A gel will be put on your skin. It may feel cool.  The transducer device will be put on your skin. It will be moved back and forth over the area being looked at.  The device will take pictures. They will show on small TV screens.  You may be asked to change your position.  After the exam, the gel will be cleaned off.   What happens after the procedure?  It is up to you to get the results of your test. Ask your doctor, or the department that is doing the test, when your results will be ready.  Keep all follow-up visits as told by your doctor. This is important. Summary  An ultrasound is a test that uses sound waves to take pictures of the inside of the body.  An ultrasound may be done to check an organ or look for problems.  The test is done by moving a handheld device around on the outside of your belly or pelvis.  It is up to you to get the results of your test. Be  sure to ask when your results will be ready. This information is not intended to replace advice given to you by your health care provider. Make sure you discuss any questions you have with your health care provider. Document Revised: 01/12/2018 Document Reviewed: 01/12/2018 Elsevier Patient Education  2021 [place].  Echocardiogram An echocardiogram is a test that uses sound waves (ultrasound) to produce images of the heart. Images from an echocardiogram can  provide important information about:  Heart size and shape.  The size and thickness and movement of your heart's walls.  Heart muscle function and strength.  Heart valve function or if you have stenosis. Stenosis is when the heart valves are too narrow.  If blood is flowing backward through the heart valves (regurgitation).  A tumor or infectious growth around the heart valves.  Areas of heart muscle that are not working well because of poor blood flow or injury from a heart attack.  Aneurysm detection. An aneurysm is a weak or damaged part of an artery wall. The wall bulges out from the normal force of blood pumping through the body. Tell a health care provider about:  Any allergies you have.  All medicines you are taking, including vitamins, herbs, eye drops, creams, and over-the-counter medicines.  Any blood disorders you have.  Any surgeries you have had.  Any medical conditions you have.  Whether you are pregnant or may be pregnant. What are the risks? Generally, this is a safe test. However, problems may occur, including an allergic reaction to dye (contrast) that may be used during the test. What happens before the test? No specific preparation is needed. You may eat and drink normally. What happens during the test?  You will take off your clothes from the waist up and put on a hospital gown.  Electrodes or electrocardiogram (ECG)patches may be placed on your chest. The electrodes or patches are then connected to a device that monitors your heart rate and rhythm.  You will lie down on a table for an ultrasound exam. A gel will be applied to your chest to help sound waves pass through your skin.  A handheld device, called a transducer, will be pressed against your chest and moved over your heart. The transducer produces sound waves that travel to your heart and bounce back (or "echo" back) to the transducer. These sound waves will be captured in real-time and changed  into images of your heart that can be viewed on a video monitor. The images will be recorded on a computer and reviewed by your health care provider.  You may be asked to change positions or hold your breath for a short time. This makes it easier to get different views or better views of your heart.  In some cases, you may receive contrast through an IV in one of your veins. This can improve the quality of the pictures from your heart. The procedure may vary among health care providers and hospitals.   What can I expect after the test? You may return to your normal, everyday life, including diet, activities, and medicines, unless your health care provider tells you not to do that. Follow these instructions at home:  It is up to you to get the results of your test. Ask your health care provider, or the department that is doing the test, when your results will be ready.  Keep all follow-up visits. This is important. Summary  An echocardiogram is a test that uses sound waves (ultrasound) to produce  images of the heart.  Images from an echocardiogram can provide important information about the size and shape of your heart, heart muscle function, heart valve function, and other possible heart problems.  You do not need to do anything to prepare before this test. You may eat and drink normally.  After the echocardiogram is completed, you may return to your normal, everyday life, unless your health care provider tells you not to do that. This information is not intended to replace advice given to you by your health care provider. Make sure you discuss any questions you have with your health care provider. Document Revised: 02/08/2020 Document Reviewed: 02/08/2020 Elsevier Patient Education  2021 [employer].      Adopting a Healthy Lifestyle.  Know what a healthy weight is for you (roughly BMI <25) and aim to maintain this   Aim for 7+ servings of fruits and vegetables daily   65-80+  fluid ounces of water or unsweet tea for healthy kidneys   Limit to max 1 drink of alcohol per day; avoid smoking/tobacco   Limit animal fats in diet for cholesterol and heart health - choose grass fed whenever available   Avoid highly processed foods, and foods high in saturated/trans fats   Aim for low stress - take time to unwind and care for your mental health   Aim for 150 min of moderate intensity exercise weekly for heart health, and weights twice weekly for bone health   Aim for 7-9 hours of sleep daily   When it comes to diets, agreement about the perfect plan isnt easy to find, even among the experts. Experts at the [employer] developed an idea known as the Healthy Eating Plate. Just imagine a plate divided into logical, healthy portions.   The emphasis is on diet quality:   Load up on vegetables and fruits - one-half of your plate: Aim for color and variety, and remember that potatoes dont count.   Go for whole grains - one-quarter of your plate: Whole wheat, barley, wheat berries, quinoa, oats, brown rice, and foods made with them. If you want pasta, go with whole wheat pasta.   Protein power - one-quarter of your plate: Fish, chicken, beans, and nuts are all healthy, versatile protein sources. Limit red meat.   The diet, however, does go beyond the plate, offering a few other suggestions.   Use healthy plant oils, such as olive, canola, soy, corn, sunflower and peanut. Check the labels, and avoid partially hydrogenated oil, which have unhealthy trans fats.   If youre thirsty, drink water. Coffee and tea are good in moderation, but skip sugary drinks and limit milk and dairy products to one or two daily servings.   The type of carbohydrate in the diet is more important than the amount. Some sources of carbohydrates, such as vegetables, fruits, whole grains, and beans-are healthier than others.   Finally, stay active  Signed, Aaron Salines, DO   09/21/2020 4:52 PM    [facility]

## 2020-09-22 ENCOUNTER — Other Ambulatory Visit: Payer: Self-pay

## 2020-09-22 LAB — VITAMIN D 25 HYDROXY (VIT D DEFICIENCY, FRACTURES): Vit D, 25-Hydroxy: 21.6 ng/mL — ABNORMAL LOW (ref 30.0–100.0)

## 2020-09-22 MED ORDER — VITAMIN D (ERGOCALCIFEROL) 1.25 MG (50000 UNIT) PO CAPS: 50000.0000 [IU] | ORAL_CAPSULE | ORAL | 0 refills | Status: DC

## 2020-09-22 NOTE — Progress Notes (Signed)
Prescription sent to pharmacy.

## 2020-10-03 ENCOUNTER — Other Ambulatory Visit: Payer: Self-pay

## 2020-10-03 ENCOUNTER — Emergency Department (HOSPITAL_BASED_OUTPATIENT_CLINIC_OR_DEPARTMENT_OTHER): Payer: Medicare Other

## 2020-10-03 ENCOUNTER — Emergency Department (HOSPITAL_BASED_OUTPATIENT_CLINIC_OR_DEPARTMENT_OTHER)
Admission: EM | Admit: 2020-10-03 | Discharge: 2020-10-03 | Disposition: A | Discharge status: Home / Self Care | Payer: Medicare Other | Attending: Emergency Medicine | Admitting: Emergency Medicine

## 2020-10-03 ENCOUNTER — Encounter (HOSPITAL_BASED_OUTPATIENT_CLINIC_OR_DEPARTMENT_OTHER): Payer: Self-pay | Admitting: *Deleted

## 2020-10-03 DIAGNOSIS — Z7901 Long term (current) use of anticoagulants: Secondary | ICD-10-CM | POA: Diagnosis not present

## 2020-10-03 DIAGNOSIS — Z87891 Personal history of nicotine dependence: Secondary | ICD-10-CM | POA: Insufficient documentation

## 2020-10-03 DIAGNOSIS — I4901 Ventricular fibrillation: Secondary | ICD-10-CM | POA: Insufficient documentation

## 2020-10-03 DIAGNOSIS — I480 Paroxysmal atrial fibrillation: Secondary | ICD-10-CM | POA: Diagnosis not present

## 2020-10-03 DIAGNOSIS — R0602 Shortness of breath: Secondary | ICD-10-CM | POA: Insufficient documentation

## 2020-10-03 DIAGNOSIS — I4891 Unspecified atrial fibrillation: Secondary | ICD-10-CM

## 2020-10-03 DIAGNOSIS — I10 Essential (primary) hypertension: Secondary | ICD-10-CM | POA: Insufficient documentation

## 2020-10-03 DIAGNOSIS — Z79899 Other long term (current) drug therapy: Secondary | ICD-10-CM | POA: Diagnosis not present

## 2020-10-03 DIAGNOSIS — J4520 Mild intermittent asthma, uncomplicated: Secondary | ICD-10-CM | POA: Diagnosis not present

## 2020-10-03 DIAGNOSIS — R001 Bradycardia, unspecified: Secondary | ICD-10-CM | POA: Diagnosis present

## 2020-10-03 LAB — CBC WITH DIFFERENTIAL/PLATELET
Abs Immature Granulocytes: 0.01 10*3/uL (ref 0.00–0.07)
Basophils Absolute: 0 10*3/uL (ref 0.0–0.1)
Basophils Relative: 0 %
Eosinophils Absolute: 0.1 10*3/uL (ref 0.0–0.5)
Eosinophils Relative: 3 %
HCT: 43.9 % (ref 39.0–52.0)
Hemoglobin: 14.9 g/dL (ref 13.0–17.0)
Immature Granulocytes: 0 %
Lymphocytes Relative: 32 %
Lymphs Abs: 1.6 10*3/uL (ref 0.7–4.0)
MCH: 29.5 pg (ref 26.0–34.0)
MCHC: 33.9 g/dL (ref 30.0–36.0)
MCV: 86.9 fL (ref 80.0–100.0)
Monocytes Absolute: 0.5 10*3/uL (ref 0.1–1.0)
Monocytes Relative: 11 %
Neutro Abs: 2.6 10*3/uL (ref 1.7–7.7)
Neutrophils Relative %: 54 %
Platelets: 141 10*3/uL — ABNORMAL LOW (ref 150–400)
RBC: 5.05 MIL/uL (ref 4.22–5.81)
RDW: 13.2 % (ref 11.5–15.5)
WBC: 4.9 10*3/uL (ref 4.0–10.5)
nRBC: 0 % (ref 0.0–0.2)

## 2020-10-03 LAB — COMPREHENSIVE METABOLIC PANEL
ALT: 20 U/L (ref 0–44)
AST: 21 U/L (ref 15–41)
Albumin: 4.2 g/dL (ref 3.5–5.0)
Alkaline Phosphatase: 69 U/L (ref 38–126)
Anion gap: 9 (ref 5–15)
BUN: 19 mg/dL (ref 8–23)
CO2: 24 mmol/L (ref 22–32)
Calcium: 9.1 mg/dL (ref 8.9–10.3)
Chloride: 103 mmol/L (ref 98–111)
Creatinine, Ser: 0.92 mg/dL (ref 0.61–1.24)
GFR, Estimated: >60 mL/min (ref >60)
Glucose, Bld: 126 mg/dL — ABNORMAL HIGH (ref 70–99)
Potassium: 3.5 mmol/L (ref 3.5–5.1)
Sodium: 136 mmol/L (ref 135–145)
Total Bilirubin: 0.1 mg/dL — ABNORMAL LOW (ref 0.3–1.2)
Total Protein: 7.3 g/dL (ref 6.5–8.1)

## 2020-10-03 LAB — TROPONIN I (HIGH SENSITIVITY)
Troponin I (High Sensitivity): 3 ng/L (ref <18)
Troponin I (High Sensitivity): 4 ng/L (ref <18)

## 2020-10-03 LAB — PROTIME-INR
INR: 1 (ref 0.8–1.2)
Prothrombin Time: 12.5 seconds (ref 11.4–15.2)

## 2020-10-03 LAB — BRAIN NATRIURETIC PEPTIDE: B Natriuretic Peptide: 34 pg/mL (ref 0.0–100.0)

## 2020-10-03 LAB — TSH: TSH: 1.479 u[IU]/mL (ref 0.350–4.500)

## 2020-10-03 MED ORDER — METOPROLOL TARTRATE 5 MG/5ML IV SOLN
5.0000 mg | Freq: Once | INTRAVENOUS | Status: AC
  Administered 2020-10-03: 5 mg via INTRAVENOUS
  Filled 2020-10-03: qty 5

## 2020-10-03 MED ORDER — METOPROLOL SUCCINATE ER 25 MG PO TB24: 25.0000 mg | ORAL_TABLET | Freq: Every day | ORAL | 1 refills | Status: DC

## 2020-10-03 MED ORDER — METOPROLOL TARTRATE 5 MG/5ML IV SOLN
2.5000 mg | Freq: Once | INTRAVENOUS | Status: AC
  Administered 2020-10-03: 2.5 mg via INTRAVENOUS
  Filled 2020-10-03: qty 5

## 2020-10-03 MED ORDER — APIXABAN 2.5 MG PO TABS
5.0000 mg | ORAL_TABLET | Freq: Once | ORAL | Status: AC
  Administered 2020-10-03: 5 mg via ORAL
  Filled 2020-10-03: qty 2

## 2020-10-03 MED ORDER — APIXABAN 5 MG PO TABS: 5.0000 mg | ORAL_TABLET | Freq: Two times a day (BID) | ORAL | 0 refills | Status: DC

## 2020-10-03 NOTE — ED Notes (Signed)
ED Provider at bedside. 

## 2020-10-03 NOTE — ED Notes (Signed)
Patient transported to X-ray 

## 2020-10-03 NOTE — Discharge Instructions (Addendum)
1.  Fill your Eliquis prescription and start taking it tomorrow morning.  Take a dose of your metoprolol 25 mg this evening when you get home.    2.  Monitor your heart rate and your blood pressure over the day tomorrow.  If your heart rate remains greater than 70 bpm and your blood pressure is greater than 120/70, take another dose of metoprolol tomorrow night.    3.  You will be called by the cardiology office for atrial fibrillation to get an appointment.  If you have questions or concerns you may call [facility] as listed in your discharge instructions.  If you are developing concerning symptoms such as lightheadedness, chest pain, shortness of breath or nearly passing out, return to the emergency department immediately.    Information on my medicine - ELIQUIS (apixaban)  Why was Eliquis prescribed for you? Eliquis was prescribed for you to reduce the risk of a blood clot forming that can cause a stroke if you have a medical condition called atrial fibrillation (a type of irregular heartbeat).  What do You need to know about Eliquis ? Take your Eliquis TWICE DAILY - one tablet in the morning and one tablet in the evening with or without food. If you have difficulty swallowing the tablet whole please discuss with your pharmacist how to take the medication safely.  Take Eliquis exactly as prescribed by your doctor and DO NOT stop taking Eliquis without talking to the doctor who prescribed the medication.  Stopping may increase your risk of developing a stroke.  Refill your prescription before you run out.  After discharge, you should have regular check-up appointments with your healthcare provider that is prescribing your Eliquis.  In the future your dose may need to be changed if your kidney function or weight changes by a significant amount or as you get older.  What do you do if you miss a dose? If you miss a dose, take it as soon as you remember on the same  day and resume taking twice daily.  Do not take more than one dose of ELIQUIS at the same time to make up a missed dose.  Important Safety Information A possible side effect of Eliquis is bleeding. You should call your healthcare provider right away if you experience any of the following: Bleeding from an injury or your nose that does not stop. Unusual colored urine (red or dark brown) or unusual colored stools (red or black). Unusual bruising for unknown reasons. A serious fall or if you hit your head (even if there is no bleeding).  Some medicines may interact with Eliquis and might increase your risk of bleeding or clotting while on Eliquis. To help avoid this, consult your healthcare provider or pharmacist prior to using any new prescription or non-prescription medications, including herbals, vitamins, non-steroidal anti-inflammatory drugs (NSAIDs) and supplements.  This website has more information on Eliquis (apixaban): http://www.eliquis.com/eliquis/home

## 2020-10-03 NOTE — ED Triage Notes (Signed)
Hx of irregular heart beat. He was placed on a Holter monitor a week ago. He listened to his heart today and it sounded irregular. Asymptomatic.

## 2020-10-03 NOTE — ED Provider Notes (Signed)
[facility] EMERGENCY DEPARTMENT Provider Note   CSN: [id number] Arrival date & time: 10/03/20  1556     History No chief complaint on file.   Aaron Ruiz is a 65 y.o. male.  HPI Patient sees Dr. Jorene Minors at [facility].  He was taken off metoprolol at the beginning of March because he was identified to have a slow heart rate when he tried to give blood. A Zio patch placed 3/24.  Patient was called today for rapid atrial fibrillation.  ~Comes emergency department.  Patient reports that for the past several weeks he has been noting episodes of his heart seeming to skip or do a "Morris Code"  He has been sometimes feeling a little more short of breath but no chest pain.  No near syncope.  No swelling in the legs.  He reports otherwise he has been feeling pretty well.  No known history of any heart problems.  No history of stroke.  Patient does not have diabetes.  He reports his A1c was little borderline at the last check at 6.  He does take medications for hypercholesterolemia.    Past Medical History:  Diagnosis Date  . Anemia   . Asthma    uses inhaler  . Basal cell carcinoma 2017   left side of nose  . Borderline diabetes 05/03/2011  . Colon polyps   . Diverticulosis   . GERD (gastroesophageal reflux disease)    on meds  . Hx of adenomatous colonic polyps 03/07/2009  . Hx of eye surgery 2018   "Left Eye was swollen and had procedure to remove excess swelling"  . Hyperglycemia 05/01/2011  . Hyperlipidemia   . Hypertension   . Lactose intolerance     Patient Active Problem List   Diagnosis Date Noted  . Paroxysmal atrial fibrillation ([place]) 10/06/2020  . Secondary hypercoagulable state ([place]) 10/06/2020  . Palpitations 09/21/2020  . Daytime somnolence 09/21/2020  . Fatigue 09/21/2020  . Snoring 09/21/2020  . Former smoker 09/21/2020  . Vitamin D deficiency 09/21/2020  . Obesity (BMI 30-39.9) 09/21/2020  . Lactose intolerance   . Hypertension   . GERD  (gastroesophageal reflux disease)   . Diverticulosis   . Colon polyps   . Anemia   . Seasonal and perennial allergic rhinitis 06/30/2018  . Allergic conjunctivitis 06/30/2018  . Mild intermittent asthma 06/30/2018  . Chemosis of conjunctiva of both eyes 01/15/2017  . Hx of eye surgery 2018  . Basal cell carcinoma 2017  . BPH (benign prostatic hyperplasia) 07/29/2014  . Recurrent UTI 07/30/2013  . Rash/urticaria 08/07/2012  . Routine general medical examination at a health care facility 12/16/2011  . Osteoarthritis 09/24/2011  . Borderline diabetes 05/03/2011  . Hyperglycemia 05/01/2011  . Hypogonadism male 04/30/2011  . General medical examination 11/14/2010  . ACTINIC KERATOSIS 03/20/2009  . SKIN LESION 03/20/2009  . Hx of adenomatous colonic polyps 03/07/2009  . High cholesterol 02/06/2009  . OVERWEIGHT 02/06/2009  . ANEMIA-NOS 02/06/2009  . Essential hypertension 02/06/2009  . Unspecified asthma(493.90) 02/06/2009  . GERD 02/06/2009    Past Surgical History:  Procedure Laterality Date  . CYSTECTOMY  05/2004   removed fromback /sebaceous cyst  . [place]   left  . mohs procedure  2017   left side of nose  . NASAL SEPTUM SURGERY         Family History  Problem Relation Age of Onset  . Coronary artery disease Father  CABG X5  . Diabetes Father   . Colon cancer Maternal Aunt   . Cancer Brother        lung (smoker)  . Hypertension Other   . Kidney disease Other     Social History   Tobacco Use  . Smoking status: Former Smoker    Types: Cigarettes  . Smokeless tobacco: Never Used  Vaping Use  . Vaping Use: Never used  Substance Use Topics  . Alcohol use: No    Alcohol/week: 0.0 standard drinks  . Drug use: No    Home Medications Prior to Admission medications   Medication Sig Start Date End Date Taking? Authorizing Provider  amLODipine (NORVASC) 10 MG tablet TAKE 1 TABLET(10 MG) BY MOUTH DAILY 07/17/20  Yes [name], NP  apixaban (ELIQUIS) 5 MG TABS tablet Take 1 tablet (5 mg total) by mouth 2 (two) times daily. 10/03/20 11/02/20 Yes Charlesetta Shanks, MD  cetirizine (ZYRTEC) 10 MG tablet Take 10 mg by mouth daily.   Yes [provider]  fluticasone (FLONASE) 50 MCG/ACT nasal spray Place 2 sprays into both nostrils daily. 09/29/19  Yes [name], NP  guaiFENesin (MUCINEX) 600 MG 12 hr tablet Take 600 mg by mouth 2 (two) times daily as needed.   Yes [provider]  hydrochlorothiazide (HYDRODIURIL) 25 MG tablet Take 1 tablet (25 mg total) by mouth daily. 09/11/20  Yes [name], NP  losartan (COZAAR) 100 MG tablet TAKE 1 TABLET(100 MG) BY MOUTH DAILY 06/09/20  Yes [name], NP  metoprolol succinate (TOPROL-XL) 25 MG 24 hr tablet Take 1 tablet (25 mg total) by mouth daily. 10/03/20  Yes Charlesetta Shanks, MD  omeprazole (PRILOSEC) 40 MG capsule TAKE 1 CAPSULE(40 MG) BY MOUTH DAILY 09/10/20  Yes [name], NP  pravastatin (PRAVACHOL) 40 MG tablet Take 1 tablet (40 mg total) by mouth daily. 09/11/20  Yes [name], NP  tamsulosin (FLOMAX) 0.4 MG CAPS capsule TAKE 1 CAPSULE(0.4 MG) BY MOUTH DAILY 06/13/20  Yes [name], NP  Vitamin D, Ergocalciferol, (DRISDOL) 1.25 MG (50000 UNIT) CAPS capsule Take 1 capsule (50,000 Units total) by mouth every 7 (seven) days. 09/22/20  Yes [name], [name], DO  acetaminophen (TYLENOL) 500 MG tablet Take 1 tablet (500 mg total) by mouth every 6 (six) hours as needed. 06/19/17   [name], NP  Omega-3 Fatty Acids (FISH OIL) 1200 MG CAPS Take by mouth.    [provider]  Zoster Vaccine Adjuvanted [facility]) injection Inject 0.5mg  IM now and again in 2-6 months. 01/11/20   [name], NP    Allergies    Sulfonamide derivatives  Review of Systems   Review of Systems 10 systems reviewed and negative except as per HPI Physical Exam Updated Vital Signs BP 110/73   Pulse 71   Temp 98.2  F (36.8 C) (Oral)   Resp 19   Ht 5\' 7"  (1.702 m)   Wt 90.3 kg   SpO2 97%   BMI 31.18 kg/m   Physical Exam Constitutional:      Appearance: Normal appearance.  HENT:     Mouth/Throat:     Pharynx: Oropharynx is clear.  Cardiovascular:     Comments: Tachycardia.  Irregularly irregular. Pulmonary:     Effort: Pulmonary effort is normal.     Breath sounds: Normal breath sounds.  Abdominal:     General: There is no distension.     Palpations: Abdomen is soft.     Tenderness: There is no abdominal tenderness. There  is no guarding.  Musculoskeletal:        General: No swelling or tenderness. Normal range of motion.     Right lower leg: No edema.     Left lower leg: No edema.  Skin:    General: Skin is warm and dry.  Neurological:     General: No focal deficit present.     Mental Status: He is alert and oriented to person, place, and time.     Coordination: Coordination normal.  Psychiatric:        Mood and Affect: Mood normal.     ED Results / Procedures / Treatments   Labs (all labs ordered are listed, but only abnormal results are displayed) Labs Reviewed  COMPREHENSIVE METABOLIC PANEL - Abnormal; Notable for the following components:      Result Value   Glucose, Bld 126 (*)    Total Bilirubin 0.1 (*)    All other components within normal limits  CBC WITH DIFFERENTIAL/PLATELET - Abnormal; Notable for the following components:   Platelets 141 (*)    All other components within normal limits  BRAIN NATRIURETIC PEPTIDE  PROTIME-INR  TSH  TROPONIN I (HIGH SENSITIVITY)  TROPONIN I (HIGH SENSITIVITY)    EKG EKG Interpretation  Date/Time:  Tuesday October 03 2020 16:06:02 EDT Ventricular Rate:  133 PR Interval:    QRS Duration: 88 QT Interval:  298 QTC Calculation: 443 R Axis:   43 Text Interpretation: Atrial fibrillation with rapid ventricular response Nonspecific ST abnormality Abnormal ECG Confirmed by Charlesetta Shanks [phone]) on 10/03/2020 4:39:34  PM   Radiology No results found.  Procedures Procedures  CRITICAL CARE Performed by: Charlesetta Shanks   Total critical care time: 33 minutes  Critical care time was exclusive of separately billable procedures and treating other patients.  Critical care was necessary to treat or prevent imminent or life-threatening deterioration.  Critical care was time spent personally by me on the following activities: development of treatment plan with patient and/or surrogate as well as nursing, discussions with consultants, evaluation of patient's response to treatment, examination of patient, obtaining history from patient or surrogate, ordering and performing treatments and interventions, ordering and review of laboratory studies, ordering and review of radiographic studies, pulse oximetry and re-evaluation of patient's condition. Medications Ordered in ED Medications  metoprolol tartrate (LOPRESSOR) injection 5 mg (5 mg Intravenous Given 10/03/20 1709)  metoprolol tartrate (LOPRESSOR) injection 2.5 mg (2.5 mg Intravenous Given 10/03/20 2020)  apixaban (ELIQUIS) tablet 5 mg (5 mg Oral Given 10/03/20 2111)    ED Course  I have reviewed the triage vital signs and the nursing notes.  Pertinent labs & imaging results that were available during my care of the patient were reviewed by me and considered in my medical decision making (see chart for details).    MDM Rules/Calculators/A&P                         CHA2DS2-VASc Score = 2  The patient's score is based upon: CHF History: No HTN History: Yes Diabetes History: No Stroke History: No Vascular Disease History: No Age Score: 1 Gender Score: 0      ASSESSMENT AND PLAN: Paroxysmal Atrial Fibrillation (ICD10:  I48.0) The patient's CHA2DS2-VASc score is 2, indicating a 2.2% annual risk of stroke.    Secondary Hypercoagulable State (ICD10:  D68.69) The patient is at significant risk for stroke/thromboembolism based upon his CHA2DS2-VASc Score  of 2.  Start Apixaban (Eliquis).    Signed,  Charlesetta Shanks, MD    10/11/2020 12:39 PM   Consult: Reviewed with Dr. Radford Pax cardiology.  Agrees with plan for Eliquis 5 mg twice daily.  We will continue the patient's metoprolol XL 25 mg.  She will message A. fib clinic to set up follow-up appointment.  Patient presents to the line above.  He presents with atrial fibrillation new onset.  He has perceived palpitations for at least several weeks.  At this time patient does not appear to be a candidate for cardioversion in the emergency department.  He will require anticoagulation and follow-up.  He does not have chest pain or signs of congestive heart failure.  He is otherwise stable.  Will initiate outpatient treatment of new onset A. fib.  Follow-up plan arranged. Final Clinical Impression(s) / ED Diagnoses Final diagnoses:  Atrial fibrillation with rapid ventricular response ([place])  New onset atrial fibrillation ([place])    Rx / DC Orders ED Discharge Orders         Ordered    apixaban (ELIQUIS) 5 MG TABS tablet  2 times daily        10/03/20 2138    metoprolol succinate (TOPROL-XL) 25 MG 24 hr tablet  Daily        10/03/20 2138           Charlesetta Shanks, MD 10/11/20 1239

## 2020-10-04 ENCOUNTER — Telehealth: Payer: Self-pay

## 2020-10-04 NOTE — Telephone Encounter (Signed)
Tried to call patient to set up an appointment while we are in [facility]. No answer, left message for patient to return our call.

## 2020-10-04 NOTE — Telephone Encounter (Signed)
Spoke with patient about setting an appointment he is set to go to the A. Fib clinic on 4/8 and we will see him 4/19. No further questions expressed at this time.

## 2020-10-05 DIAGNOSIS — R002 Palpitations: Secondary | ICD-10-CM | POA: Diagnosis not present

## 2020-10-05 NOTE — Progress Notes (Signed)
Primary Care Physician: [name], NP Primary Cardiologist: Dr Harriet Masson Primary Electrophysiologist: none Referring Physician: [facility]   Aaron Ruiz is a 65 y.o. male with a history of HTN, HLD, atrial fibrillation who presents for consultation in the [facility]. The patient was initially diagnosed with atrial fibrillation 10/03/20 after presenting to [facility] ED with symptoms of tachypalpitations. He had just seen Dr Harriet Masson who placed a cardiac monitor to screen for arrhythmias. Patient was started on Eliquis for a CHADS2VASC score of 2. He was recently taken off of metoprolol due to bradycardia, this was resumed at the ED. He reports he has not had any palpitations since resuming BB. He is back in SR today. He denies any bleeding issues on anticoagulation. He denies any significant alcohol use but does admit to snoring and some daytime somnolence.   Today, he denies symptoms of palpitations, chest pain, shortness of breath, orthopnea, PND, lower extremity edema, dizziness, presyncope, syncope, bleeding, or neurologic sequela. The patient is tolerating medications without difficulties and is otherwise without complaint today.    Atrial Fibrillation Risk Factors:  he does have symptoms or diagnosis of sleep apnea. Patient declined sleep study. he does not have a history of rheumatic fever. he does not have a history of alcohol use. The patient does have a history of early familial atrial fibrillation or other arrhythmias. Sister has afib s/p ablation.  he has a BMI of Body mass index is 31.32 kg/m.[name] Filed Weights   10/06/20 0857  Weight: 90.7 kg    Family History  Problem Relation Age of Onset  . Coronary artery disease Father        CABG X5  . Diabetes Father   . Colon cancer Maternal Aunt   . Cancer Brother        lung (smoker)  . Hypertension Other   . Kidney disease Other      Atrial Fibrillation Management  history:  Previous antiarrhythmic drugs: none Previous cardioversions: none Previous ablations: none CHADS2VASC score: 2 Anticoagulation history: Eliquis   Past Medical History:  Diagnosis Date  . Anemia   . Asthma    uses inhaler  . Basal cell carcinoma 2017   left side of nose  . Borderline diabetes 05/03/2011  . Colon polyps   . Diverticulosis   . GERD (gastroesophageal reflux disease)    on meds  . Hx of adenomatous colonic polyps 03/07/2009  . Hx of eye surgery 2018   "Left Eye was swollen and had procedure to remove excess swelling"  . Hyperglycemia 05/01/2011  . Hyperlipidemia   . Hypertension   . Lactose intolerance    Past Surgical History:  Procedure Laterality Date  . CYSTECTOMY  05/2004   removed fromback /sebaceous cyst  . [place]   left  . mohs procedure  2017   left side of nose  . NASAL SEPTUM SURGERY      Current Outpatient Medications  Medication Sig Dispense Refill  . acetaminophen (TYLENOL) 500 MG tablet Take 1 tablet (500 mg total) by mouth every 6 (six) hours as needed. 30 tablet 5  . amLODipine (NORVASC) 10 MG tablet TAKE 1 TABLET(10 MG) BY MOUTH DAILY 90 tablet 1  . apixaban (ELIQUIS) 5 MG TABS tablet Take 1 tablet (5 mg total) by mouth 2 (two) times daily. 60 tablet 0  . cetirizine (ZYRTEC) 10 MG tablet Take 10 mg by mouth daily.    . fluticasone (  FLONASE) 50 MCG/ACT nasal spray Place 2 sprays into both nostrils daily. 16 g 6  . guaiFENesin (MUCINEX) 600 MG 12 hr tablet Take 600 mg by mouth 2 (two) times daily as needed.    . hydrochlorothiazide (HYDRODIURIL) 25 MG tablet Take 1 tablet (25 mg total) by mouth daily. 90 tablet 0  . losartan (COZAAR) 100 MG tablet TAKE 1 TABLET(100 MG) BY MOUTH DAILY 90 tablet 1  . metoprolol succinate (TOPROL-XL) 25 MG 24 hr tablet Take 1 tablet (25 mg total) by mouth daily. 30 tablet 1  . Omega-3 Fatty Acids (FISH OIL) 1200 MG CAPS Take by mouth.    [name] omeprazole (PRILOSEC) 40 MG capsule  TAKE 1 CAPSULE(40 MG) BY MOUTH DAILY 90 capsule 1  . pravastatin (PRAVACHOL) 40 MG tablet Take 1 tablet (40 mg total) by mouth daily. 90 tablet 1  . tamsulosin (FLOMAX) 0.4 MG CAPS capsule TAKE 1 CAPSULE(0.4 MG) BY MOUTH DAILY 90 capsule 1  . Vitamin D, Ergocalciferol, (DRISDOL) 1.25 MG (50000 UNIT) CAPS capsule Take 1 capsule (50,000 Units total) by mouth every 7 (seven) days. 12 capsule 0  . Zoster Vaccine Adjuvanted [facility]) injection Inject 0.5mg  IM now and again in 2-6 months. 0.5 mL 1   No current facility-administered medications for this encounter.    Allergies  Allergen Reactions  . Sulfonamide Derivatives     REACTION: Hallucination    Social History   Socioeconomic History  . Marital status: Married    Spouse name: Not on file  . Number of children: Not on file  . Years of education: Not on file  . Highest education level: Not on file  Occupational History  . Not on file  Tobacco Use  . Smoking status: Former Smoker    Types: Cigarettes  . Smokeless tobacco: Never Used  Vaping Use  . Vaping Use: Never used  Substance and Sexual Activity  . Alcohol use: No    Alcohol/week: 0.0 standard drinks  . Drug use: No  . Sexual activity: Not on file  Other Topics Concern  . 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  Physical Exam: Vitals:   10/06/20 0857  BP: 140/64  Pulse: 66  Weight: 90.7 kg  Height: 5\' 7"   (1.702 m)    GEN- The patient is a well appearing obese male, alert and oriented x 3 today.   Head- normocephalic, atraumatic Eyes-  Sclera clear, conjunctiva pink Ears- hearing intact Oropharynx- clear Neck- supple  Lungs- Clear to ausculation bilaterally, normal work of breathing Heart- Regular rate and rhythm, no murmurs, rubs or gallops  GI- soft, NT, ND, + BS Extremities- no clubbing, cyanosis, or edema MS- no significant deformity or atrophy Skin- no rash or lesion Psych- euthymic mood, full affect Neuro- strength and sensation are intact  Wt Readings from Last 3 Encounters:  10/06/20 90.7 kg  10/03/20 90.3 kg  09/21/20 90.7 kg    EKG today demonstrates  SR Vent. rate 66 BPM PR interval 158 ms QRS duration 90 ms QT/QTcB 386/404 ms  Epic records are reviewed at length today  CHA2DS2-VASc Score = 2  The patient's score is based upon: CHF History: No HTN History: Yes Diabetes History: No Stroke History: No Vascular Disease History: No Age Score: 1 Gender Score: 0      ASSESSMENT AND PLAN: 1. Paroxysmal Atrial Fibrillation (ICD10:  I48.0) The patient's CHA2DS2-VASc score is 2, indicating a 2.2% annual risk of stroke.   General education about afib provided and questions answered. We also discussed his stroke risk and the risks and benefits of anticoagulation. Patient doing well after resuming BB. Will see what his burden is on monitor. Can consider AAD (likely flecainide if no structural disease on echo) vs ablation if his afib is persistent.  Echocardiogram scheduled.  Continue Eliquis 5 mg BID Continue Toprol 25 mg daily  2. Secondary Hypercoagulable State (ICD10:  D68.69) The patient is at significant risk for stroke/thromboembolism based upon his CHA2DS2-VASc Score of 2.  Continue Apixaban (Eliquis).   3. Obesity Body mass index is 31.32 kg/m. Lifestyle modification was discussed at length including regular exercise and weight reduction.  4.  Snoring/daytime somnolence  The importance of adequate treatment of sleep apnea was discussed today in order to improve our ability to maintain sinus rhythm long term. Patient declined sleep study at this time. States he will "think about it."  5. HTN Stable, no changes today.   Follow up with Dr Harriet Masson as scheduled.    [facility] [address] [place], [place] [address] [phone] 10/06/2020 9:14 AM

## 2020-10-06 ENCOUNTER — Other Ambulatory Visit: Payer: Self-pay

## 2020-10-06 ENCOUNTER — Encounter (HOSPITAL_COMMUNITY): Payer: Self-pay | Admitting: Physician Assistant

## 2020-10-06 ENCOUNTER — Ambulatory Visit (HOSPITAL_COMMUNITY)
Admission: RE | Admit: 2020-10-06 | Discharge: 2020-10-06 | Disposition: A | Discharge status: Home / Self Care | Payer: Medicare Other | Source: Ambulatory Visit | Attending: Physician Assistant | Admitting: Physician Assistant

## 2020-10-06 VITALS — BP 140/64 | HR 66 | Ht 67.0 in | Wt 200.0 lb

## 2020-10-06 DIAGNOSIS — E669 Obesity, unspecified: Secondary | ICD-10-CM | POA: Diagnosis not present

## 2020-10-06 DIAGNOSIS — Z87891 Personal history of nicotine dependence: Secondary | ICD-10-CM | POA: Insufficient documentation

## 2020-10-06 DIAGNOSIS — Z7901 Long term (current) use of anticoagulants: Secondary | ICD-10-CM | POA: Diagnosis not present

## 2020-10-06 DIAGNOSIS — R0683 Snoring: Secondary | ICD-10-CM | POA: Insufficient documentation

## 2020-10-06 DIAGNOSIS — R400 Somnolence: Secondary | ICD-10-CM | POA: Diagnosis not present

## 2020-10-06 DIAGNOSIS — D6869 Other thrombophilia: Secondary | ICD-10-CM | POA: Insufficient documentation

## 2020-10-06 DIAGNOSIS — Z79899 Other long term (current) drug therapy: Secondary | ICD-10-CM | POA: Diagnosis not present

## 2020-10-06 DIAGNOSIS — Z6831 Body mass index (BMI) 31.0-31.9, adult: Secondary | ICD-10-CM | POA: Diagnosis not present

## 2020-10-06 DIAGNOSIS — I480 Paroxysmal atrial fibrillation: Secondary | ICD-10-CM | POA: Diagnosis not present

## 2020-10-06 DIAGNOSIS — Z8249 Family history of ischemic heart disease and other diseases of the circulatory system: Secondary | ICD-10-CM | POA: Diagnosis not present

## 2020-10-06 DIAGNOSIS — Z713 Dietary counseling and surveillance: Secondary | ICD-10-CM | POA: Diagnosis not present

## 2020-10-06 DIAGNOSIS — I10 Essential (primary) hypertension: Secondary | ICD-10-CM | POA: Diagnosis not present

## 2020-10-06 DIAGNOSIS — H524 Presbyopia: Secondary | ICD-10-CM | POA: Diagnosis not present

## 2020-10-06 HISTORY — DX: Other thrombophilia: D68.69

## 2020-10-06 HISTORY — DX: Paroxysmal atrial fibrillation: I48.0

## 2020-10-09 DIAGNOSIS — R002 Palpitations: Secondary | ICD-10-CM | POA: Diagnosis not present

## 2020-10-16 DIAGNOSIS — E785 Hyperlipidemia, unspecified: Secondary | ICD-10-CM | POA: Insufficient documentation

## 2020-10-16 DIAGNOSIS — L814 Other melanin hyperpigmentation: Secondary | ICD-10-CM | POA: Insufficient documentation

## 2020-10-16 DIAGNOSIS — D485 Neoplasm of uncertain behavior of skin: Secondary | ICD-10-CM | POA: Insufficient documentation

## 2020-10-16 DIAGNOSIS — C44311 Basal cell carcinoma of skin of nose: Secondary | ICD-10-CM

## 2020-10-16 HISTORY — DX: Other melanin hyperpigmentation: L81.4

## 2020-10-16 HISTORY — DX: Basal cell carcinoma of skin of nose: C44.311

## 2020-10-16 HISTORY — DX: Neoplasm of uncertain behavior of skin: D48.5

## 2020-10-17 ENCOUNTER — Other Ambulatory Visit: Payer: Self-pay

## 2020-10-17 ENCOUNTER — Ambulatory Visit: Payer: Medicare Other | Admitting: Cardiology

## 2020-10-17 ENCOUNTER — Encounter: Payer: Self-pay | Admitting: Cardiology

## 2020-10-17 VITALS — BP 128/64 | HR 66 | Ht 67.0 in | Wt 201.1 lb

## 2020-10-17 DIAGNOSIS — I480 Paroxysmal atrial fibrillation: Secondary | ICD-10-CM | POA: Diagnosis not present

## 2020-10-17 DIAGNOSIS — E782 Mixed hyperlipidemia: Secondary | ICD-10-CM | POA: Diagnosis not present

## 2020-10-17 DIAGNOSIS — E669 Obesity, unspecified: Secondary | ICD-10-CM

## 2020-10-17 DIAGNOSIS — I10 Essential (primary) hypertension: Secondary | ICD-10-CM

## 2020-10-17 NOTE — Progress Notes (Signed)
Cardiology Office Note:    Date:  10/17/2020   ID:  Aaron Ruiz, DOB July 07, 1955, MRN [id number]  PCP:  [name], NP  Cardiologist:  Berniece Salines, DO  Electrophysiologist:  None   Referring MD: [name], NP   I am doing fine   History of Present Illness:    Aaron Ruiz is a 65 y.o. male with a hx of paroxysmal atrial fibrillation recently diagnosed Metoprolol succinate and Eliquis, hypertension, hyperlipidemia, obesity here today for follow-up visit.  I did see the patient/21 September 21, 2020 at that time he reported he had been experiencing intermittent palpitations.  I placed a monitor on the patient to rule out any arrhythmias.  AAA screening as well as an echocardiogram as well as repeat testing are still pending  In the interim the patient has been diagnosed with atrial fibrillation, he has been started back on his metoprolol as well asked started on Eliquis 5 mg twice daily.  Past Medical History:  Diagnosis Date  . Actinic keratosis 03/20/2009   Qualifier: Diagnosis of  By: [name]   . Allergic conjunctivitis 06/30/2018  . Anemia   . ANEMIA-NOS 02/06/2009   Qualifier: Diagnosis of  By: [name], [name]    . Asthma    uses inhaler  . Basal cell carcinoma 2017   left side of nose  . Basal cell carcinoma of nose 10/16/2020  . Borderline diabetes 05/03/2011  . BPH (benign prostatic hyperplasia) 07/29/2014  . Chemosis of conjunctiva of both eyes 01/15/2017  . Colon polyps   . Daytime somnolence 09/21/2020  . Diverticulosis   . Essential hypertension 02/06/2009   Qualifier: Diagnosis of  By: [name], [name]     . Fatigue 09/21/2020  . Former smoker 09/21/2020  . General medical examination 11/14/2010  . GERD 02/06/2009   Qualifier: Diagnosis of  By: [name], [name]    . GERD (gastroesophageal reflux disease)    on meds  . High cholesterol 02/06/2009   Qualifier: Diagnosis of  By: [name], [name]    . Hx of adenomatous colonic  polyps 03/07/2009  . Hx of eye surgery 2018   "Left Eye was swollen and had procedure to remove excess swelling"  . Hyperglycemia 05/01/2011  . Hyperlipidemia   . Hypertension   . Hypogonadism male 04/30/2011  . Lactose intolerance   . Lentigo 10/16/2020  . Mild intermittent asthma 06/30/2018  . Neoplasm of uncertain behavior of skin 10/16/2020  . Obesity (BMI 30-39.9) 09/21/2020  . Osteoarthritis 09/24/2011  . OVERWEIGHT 02/06/2009   Qualifier: Diagnosis of  By: [name]   . Palpitations 09/21/2020  . Paroxysmal atrial fibrillation ([place]) 10/06/2020  . Rash/urticaria 08/07/2012  . Recurrent UTI 07/30/2013  . Routine general medical examination at a health care facility 12/16/2011  . Secondary hypercoagulable state ([place]) 10/06/2020  . SKIN LESION 03/20/2009   Qualifier: Diagnosis of  By: [name]   . Snoring 09/21/2020  . Unspecified asthma(493.90) 02/06/2009   Centricity Description: ASTHMA Qualifier: Diagnosis of  By: [name], [name]   Centricity Description: ASTHMA, INTERMITTENT, MILD Qualifier: Diagnosis of  By: [name], [name] S   . URI (upper respiratory infection) 07/23/2011  . Vitamin D deficiency 09/21/2020    Past Surgical History:  Procedure Laterality Date  . CYSTECTOMY  05/2004   removed fromback /sebaceous cyst  . [place]   left  . mohs procedure  2017   left side  of nose  . NASAL SEPTUM SURGERY      Current Medications: Current Meds  Medication Sig  . acetaminophen (TYLENOL) 500 MG tablet Take 1 tablet (500 mg total) by mouth every 6 (six) hours as needed.  [name] amLODipine (NORVASC) 10 MG tablet TAKE 1 TABLET(10 MG) BY MOUTH DAILY  . apixaban (ELIQUIS) 5 MG TABS tablet Take 1 tablet (5 mg total) by mouth 2 (two) times daily.  . cetirizine (ZYRTEC) 10 MG tablet Take 10 mg by mouth daily.  . fluticasone (FLONASE) 50 MCG/ACT nasal spray Place 2 sprays into both nostrils daily.  [name] guaiFENesin (MUCINEX) 600 MG 12 hr tablet Take 600 mg  by mouth 2 (two) times daily as needed.  . hydrochlorothiazide (HYDRODIURIL) 25 MG tablet Take 1 tablet (25 mg total) by mouth daily.  [name] losartan (COZAAR) 100 MG tablet TAKE 1 TABLET(100 MG) BY MOUTH DAILY  . metoprolol succinate (TOPROL-XL) 25 MG 24 hr tablet Take 1 tablet (25 mg total) by mouth daily.  . Omega-3 Fatty Acids (FISH OIL) 1200 MG CAPS Take by mouth.  [name] omeprazole (PRILOSEC) 40 MG capsule TAKE 1 CAPSULE(40 MG) BY MOUTH DAILY  . pravastatin (PRAVACHOL) 40 MG tablet Take 1 tablet (40 mg total) by mouth daily.  . tamsulosin (FLOMAX) 0.4 MG CAPS capsule TAKE 1 CAPSULE(0.4 MG) BY MOUTH DAILY  . Vitamin D, Ergocalciferol, (DRISDOL) 1.25 MG (50000 UNIT) CAPS capsule Take 1 capsule (50,000 Units total) by mouth every 7 (seven) days.  . Zoster Vaccine Adjuvanted [facility]) injection Inject 0.5mg  IM now and again in 2-6 months.     Allergies:   Sulfonamide derivatives   Social History   Socioeconomic History  . Marital status: Married    Spouse name: Not on file  . Number of children: Not on file  . Years of education: Not on file  . Highest education level: Not on file  Occupational History  . Not on file  Tobacco Use  . Smoking status: Former Smoker    Types: Cigarettes  . Smokeless tobacco: Never Used  Vaping Use  . Vaping Use: Never used  Substance and Sexual Activity  . Alcohol use: No    Alcohol/week: 0.0 standard drinks  . Drug use: No  . Sexual activity: Not on file  Other Topics Concern  . 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[employer])   Married 32 years    1  daughter 63 - [name] (lives in [place])   1  son 34    Retired   Alcohol use-no   Smoking Status:  quit   Caffeine use/day:  2-3 beverages daily   Social Determinants of Radio broadcast assistant Strain: Not on file  Food Insecurity: Not on file  Transportation Needs: Not on file  Physical Activity: Not on file  Stress: Not on file  Social Connections: Not  on file     Family History: The patient's family history includes Cancer in his brother; Colon cancer in his maternal aunt; Coronary artery disease in his father; Diabetes in his father; Hypertension in an other family member; Kidney disease in an other family member.  ROS:   Review of Systems  Constitution: Negative for decreased appetite, fever and weight gain.  HENT: Negative for congestion, ear discharge, hoarse voice and sore throat.   Eyes: Negative for discharge, redness, vision loss in right eye and visual halos.  Cardiovascular: Negative for chest pain, dyspnea on exertion, leg swelling,  orthopnea and palpitations.  Respiratory: Negative for cough, hemoptysis, shortness of breath and snoring.   Endocrine: Negative for heat intolerance and polyphagia.  Hematologic/Lymphatic: Negative for bleeding problem. Does not bruise/bleed easily.  Skin: Negative for flushing, nail changes, rash and suspicious lesions.  Musculoskeletal: Negative for arthritis, joint pain, muscle cramps, myalgias, neck pain and stiffness.  Gastrointestinal: Negative for abdominal pain, bowel incontinence, diarrhea and excessive appetite.  Genitourinary: Negative for decreased libido, genital sores and incomplete emptying.  Neurological: Negative for brief paralysis, focal weakness, headaches and loss of balance.  Psychiatric/Behavioral: Negative for altered mental status, depression and suicidal ideas.  Allergic/Immunologic: Negative for HIV exposure and persistent infections.    EKGs/Labs/Other Studies Reviewed:    The following studies were reviewed today:   EKG: I reviewed his EKG which was done on 10/06/20 the patient was in sinus rhythm.  Recent Labs: 10/03/2020: ALT 20; B Natriuretic Peptide 34.0; BUN 19; Creatinine, Ser 0.92; Hemoglobin 14.9; Platelets 141; Potassium 3.5; Sodium 136; TSH 1.479  Recent Lipid Panel    Component Value Date/Time   CHOL 188 09/11/2020 1232   TRIG 378.0 (H) 09/11/2020  1232   HDL 40.00 09/11/2020 1232   CHOLHDL 5 09/11/2020 1232   VLDL 75.6 (H) 09/11/2020 1232   LDLCALC 80 12/31/2018 0901   LDLDIRECT 116.0 09/11/2020 1232    Physical Exam:    VS:  BP 128/64   Pulse 66   Ht 5\' 7"  (1.702 m)   Wt 201 lb 1.3 oz (91.2 kg)   SpO2 98%   BMI 31.49 kg/m     Wt Readings from Last 3 Encounters:  10/17/20 201 lb 1.3 oz (91.2 kg)  10/06/20 200 lb (90.7 kg)  10/03/20 199 lb 1.2 oz (90.3 kg)     GEN: Well nourished, well developed in no acute distress HEENT: Normal NECK: No JVD; No carotid bruits LYMPHATICS: No lymphadenopathy CARDIAC: S1S2 noted,RRR, no murmurs, rubs, gallops RESPIRATORY:  Clear to auscultation without rales, wheezing or rhonchi  ABDOMEN: Soft, non-tender, non-distended, +bowel sounds, no guarding. EXTREMITIES: No edema, No cyanosis, no clubbing MUSCULOSKELETAL:  No deformity  SKIN: Warm and dry NEUROLOGIC:  Alert and oriented x 3, non-focal PSYCHIATRIC:  Normal affect, good insight  ASSESSMENT:    1. PAF (paroxysmal atrial fibrillation) ([place])   2. Essential hypertension   3. Obesity (BMI 30-39.9)   4. Mixed hyperlipidemia    PLAN:    We talked a little more about his new diagnosis of atrial fibrillation.  He has seen my colleagues in the [facility] and had previously been started on Eliquis 5 mg twice a day.  He was back on his metoprolol.  We talked about stroke prevention and maintenance of his anticoagulation medication.  His CHA2DS2-VASc score of 2.  We also talked about rate control and rhythm control.  Following the patient is seen [facility] as it would be beneficial for him to discuss rhythm control with antiarrhythmics versus PVI.  His echocardiogram is still pending along with his AAA screening ultrasound.  We again talked about the diagnoses of possible obstructive sleep apnea.  He has declined to move forward with any sleep studies.  Blood pressure is acceptable, continue with current antihypertensive  regimen.  The patient understands the need to lose weight with diet and exercise. We have discussed specific strategies for this.  The patient is in agreement with the above plan. The patient left the office in stable condition.  The patient will follow up in  Medication Adjustments/Labs and Tests Ordered: Current medicines are reviewed at length with the patient today.  Concerns regarding medicines are outlined above.  Orders Placed This Encounter  Procedures  . Ambulatory referral to Cardiac Electrophysiology   No orders of the defined types were placed in this encounter.   Patient Instructions  Medication Instructions:  Your physician recommends that you continue on your current medications as directed. Please refer to the Current Medication list given to you today.  *If you need a refill on your cardiac medications before your next appointment, please call your pharmacy*   Lab Work: None If you have labs (blood work) drawn today and your tests are completely normal, you will receive your results only by: [name] MyChart Message (if you have MyChart) OR . A paper copy in the mail If you have any lab test that is abnormal or we need to change your treatment, we will call you to review the results.   Testing/Procedures: None   Follow-Up: At [facility], you and your health needs are our priority.  As part of our continuing mission to provide you with exceptional heart care, we have created designated Provider Care Teams.  These Care Teams include your primary Cardiologist (physician) and Advanced Practice Providers (APPs -  Physician Assistants and Nurse Practitioners) who all work together to provide you with the care you need, when you need it.  We recommend signing up for the patient portal called "MyChart".  Sign up information is provided on this After Visit Summary.  MyChart is used to connect with patients for Virtual Visits (Telemedicine).  Patients are able to view  lab/test results, encounter notes, upcoming appointments, etc.  Non-urgent messages can be sent to your provider as well.   To learn more about what you can do with MyChart, go to [email or web address].    Your next appointment:   6 month(s)  The format for your next appointment:   In Person  Provider:   Berniece Salines, DO   Other Instructions      Adopting a Healthy Lifestyle.  Know what a healthy weight is for you (roughly BMI <25) and aim to maintain this   Aim for 7+ servings of fruits and vegetables daily   65-80+ fluid ounces of water or unsweet tea for healthy kidneys   Limit to max 1 drink of alcohol per day; avoid smoking/tobacco   Limit animal fats in diet for cholesterol and heart health - choose grass fed whenever available   Avoid highly processed foods, and foods high in saturated/trans fats   Aim for low stress - take time to unwind and care for your mental health   Aim for 150 min of moderate intensity exercise weekly for heart health, and weights twice weekly for bone health   Aim for 7-9 hours of sleep daily   When it comes to diets, agreement about the perfect plan isnt easy to find, even among the experts. Experts at the [place] developed an idea known as the Healthy Eating Plate. Just imagine a plate divided into logical, healthy portions.   The emphasis is on diet quality:   Load up on vegetables and fruits - one-half of your plate: Aim for color and variety, and remember that potatoes dont count.   Go for whole grains - one-quarter of your plate: Whole wheat, barley, wheat berries, quinoa, oats, brown rice, and foods made with them. If you want pasta, go with whole wheat pasta.   Protein  power - one-quarter of your plate: Fish, chicken, beans, and nuts are all healthy, versatile protein sources. Limit red meat.   The diet, however, does go beyond the plate, offering a few other suggestions.   Use healthy plant oils,  such as olive, canola, soy, corn, sunflower and peanut. Check the labels, and avoid partially hydrogenated oil, which have unhealthy trans fats.   If youre thirsty, drink water. Coffee and tea are good in moderation, but skip sugary drinks and limit milk and dairy products to one or two daily servings.   The type of carbohydrate in the diet is more important than the amount. Some sources of carbohydrates, such as vegetables, fruits, whole grains, and beans-are healthier than others.   Finally, stay active  Signed, Berniece Salines, DO  10/17/2020 4:53 PM    [facility]

## 2020-10-17 NOTE — Patient Instructions (Signed)

## 2020-10-30 ENCOUNTER — Encounter: Payer: Self-pay | Admitting: Family

## 2020-10-30 MED ORDER — METOPROLOL SUCCINATE ER 25 MG PO TB24: 25.0000 mg | ORAL_TABLET | Freq: Every day | ORAL | 1 refills | Status: DC

## 2020-10-30 MED ORDER — APIXABAN 5 MG PO TABS: 5.0000 mg | ORAL_TABLET | Freq: Two times a day (BID) | ORAL | 5 refills | Status: DC

## 2020-11-01 ENCOUNTER — Other Ambulatory Visit: Payer: Self-pay

## 2020-11-01 ENCOUNTER — Ambulatory Visit (HOSPITAL_BASED_OUTPATIENT_CLINIC_OR_DEPARTMENT_OTHER)
Admission: RE | Admit: 2020-11-01 | Discharge: 2020-11-01 | Disposition: A | Discharge status: Home / Self Care | Payer: Medicare Other | Source: Ambulatory Visit | Attending: Cardiology | Admitting: Cardiology

## 2020-11-01 DIAGNOSIS — Z136 Encounter for screening for cardiovascular disorders: Secondary | ICD-10-CM | POA: Insufficient documentation

## 2020-11-01 DIAGNOSIS — Z87891 Personal history of nicotine dependence: Secondary | ICD-10-CM

## 2020-11-01 DIAGNOSIS — E785 Hyperlipidemia, unspecified: Secondary | ICD-10-CM | POA: Diagnosis not present

## 2020-11-01 DIAGNOSIS — I10 Essential (primary) hypertension: Secondary | ICD-10-CM | POA: Insufficient documentation

## 2020-11-01 DIAGNOSIS — R5383 Other fatigue: Secondary | ICD-10-CM | POA: Insufficient documentation

## 2020-11-01 DIAGNOSIS — I4891 Unspecified atrial fibrillation: Secondary | ICD-10-CM | POA: Insufficient documentation

## 2020-11-01 DIAGNOSIS — R002 Palpitations: Secondary | ICD-10-CM | POA: Diagnosis not present

## 2020-11-01 LAB — ECHOCARDIOGRAM COMPLETE
AR max vel: 2.98 cm2
AV Area VTI: 3.18 cm2
AV Area mean vel: 3.18 cm2
AV Mean grad: 3 mmHg
AV Peak grad: 6 mmHg
Ao pk vel: 1.22 m/s
Area-P 1/2: 4.1 cm2
Calc EF: 65.3 %
S' Lateral: 2.53 cm
Single Plane A2C EF: 62.7 %
Single Plane A4C EF: 69.9 %

## 2020-11-01 MED ORDER — PERFLUTREN LIPID MICROSPHERE
1.0000 mL | INTRAVENOUS | Status: AC | PRN
  Administered 2020-11-01: 2 mL via INTRAVENOUS

## 2020-11-07 ENCOUNTER — Ambulatory Visit: Payer: Medicare Other | Admitting: Cardiology

## 2020-11-07 ENCOUNTER — Encounter: Payer: Self-pay | Admitting: Cardiology

## 2020-11-07 ENCOUNTER — Other Ambulatory Visit: Payer: Self-pay

## 2020-11-07 VITALS — BP 146/64 | HR 68 | Ht 67.0 in | Wt 201.0 lb

## 2020-11-07 DIAGNOSIS — I480 Paroxysmal atrial fibrillation: Secondary | ICD-10-CM | POA: Diagnosis not present

## 2020-11-07 MED ORDER — FLECAINIDE ACETATE 50 MG PO TABS: 50.0000 mg | ORAL_TABLET | Freq: Two times a day (BID) | ORAL | 3 refills | Status: DC

## 2020-11-07 NOTE — Patient Instructions (Signed)
Medication Instructions:  Your physician has recommended you make the following change in your medication:  1. START Flecainide 50 mg TWICE a day  *If you need a refill on your cardiac medications before your next appointment, please call your pharmacy*   Lab Work: None ordered   Testing/Procedures: None ordered   Follow-Up: At [facility], you and your health needs are our priority.  As part of our continuing mission to provide you with exceptional heart care, we have created designated Provider Care Teams.  These Care Teams include your primary Cardiologist (physician) and Advanced Practice Providers (APPs -  Physician Assistants and Nurse Practitioners) who all work together to provide you with the care you need, when you need it.  Your next appointment:   3 month(s)  The format for your next appointment:   In Person  Provider:   Allegra Lai, MD    Thank you for choosing [facility]!!   Trinidad Curet, RN [phone]   Other Instructions  Flecainide tablets What is this medicine? FLECAINIDE (FLEK a nide) is an antiarrhythmic drug. This medicine is used to prevent irregular heart rhythm. It can also slow down fast heartbeats called tachycardia. This medicine may be used for other purposes; ask your health care provider or pharmacist if you have questions. COMMON BRAND NAME(S): Tambocor What should I tell my health care provider before I take this medicine? They need to know if you have any of these conditions:  abnormal levels of potassium in the blood  heart disease including heart rhythm and heart rate problems  kidney or liver disease  recent heart attack  an unusual or allergic reaction to flecainide, local anesthetics, other medicines, foods, dyes, or preservatives  pregnant or trying to get pregnant  breast-feeding How should I use this medicine? Take this medicine by mouth with a glass of water. Follow the directions on the prescription  label. You can take this medicine with or without food. Take your doses at regular intervals. Do not take your medicine more often than directed. Do not stop taking this medicine suddenly. This may cause serious, heart-related side effects. If your doctor wants you to stop the medicine, the dose may be slowly lowered over time to avoid any side effects. Talk to your pediatrician regarding the use of this medicine in children. While this drug may be prescribed for children as young as 1 year of age for selected conditions, precautions do apply. Overdosage: If you think you have taken too much of this medicine contact a poison control center or emergency room at once. NOTE: This medicine is only for you. Do not share this medicine with others. What if I miss a dose? If you miss a dose, take it as soon as you can. If it is almost time for your next dose, take only that dose. Do not take double or extra doses. What may interact with this medicine? Do not take this medicine with any of the following medications:  amoxapine  arsenic trioxide  certain antibiotics like clarithromycin, erythromycin, gatifloxacin, gemifloxacin, levofloxacin, moxifloxacin, sparfloxacin, or troleandomycin  certain antidepressants called tricyclic antidepressants like amitriptyline, imipramine, or nortriptyline  certain medicines to control heart rhythm like disopyramide, encainide, moricizine, procainamide, propafenone, and quinidine  cisapride  delavirdine  droperidol  haloperidol  [place]  imatinib  levomethadyl  maprotiline  medicines for malaria like chloroquine and halofantrine  pentamidine  phenothiazines like chlorpromazine, mesoridazine, prochlorperazine, thioridazine  pimozide  quinine  ranolazine  ritonavir  sertindole This medicine  may also interact with the following medications:  cimetidine  dofetilide  medicines for angina or high blood pressure  medicines to control heart  rhythm like amiodarone and digoxin  ziprasidone This list may not describe all possible interactions. Give your health care provider a list of all the medicines, herbs, non-prescription drugs, or dietary supplements you use. Also tell them if you smoke, drink alcohol, or use illegal drugs. Some items may interact with your medicine. What should I watch for while using this medicine? Visit your doctor or health care professional for regular checks on your progress. Because your condition and the use of this medicine carries some risk, it is a good idea to carry an identification card, necklace or bracelet with details of your condition, medications and doctor or health care professional. Check your blood pressure and pulse rate regularly. Ask your health care professional what your blood pressure and pulse rate should be, and when you should contact him or her. Your doctor or health care professional also may schedule regular blood tests and electrocardiograms to check your progress. You may get drowsy or dizzy. Do not drive, use machinery, or do anything that needs mental alertness until you know how this medicine affects you. Do not stand or sit up quickly, especially if you are an older patient. This reduces the risk of dizzy or fainting spells. Alcohol can make you more dizzy, increase flushing and rapid heartbeats. Avoid alcoholic drinks. What side effects may I notice from receiving this medicine? Side effects that you should report to your doctor or health care professional as soon as possible:  chest pain, continued irregular heartbeats  difficulty breathing  swelling of the legs or feet  trembling, shaking  unusually weak or tired Side effects that usually do not require medical attention (report to your doctor or health care professional if they continue or are bothersome):  blurred vision  constipation  headache  nausea, vomiting  stomach pain This list may not describe all  possible side effects. Call your doctor for medical advice about side effects. You may report side effects to FDA at 1-800-FDA-1088. Where should I keep my medicine? Keep out of the reach of children. Store at room temperature between 15 and 30 degrees C (59 and 86 degrees F). Protect from light. Keep container tightly closed. Throw away any unused medicine after the expiration date. NOTE: This sheet is a summary. It may not cover all possible information. If you have questions about this medicine, talk to your doctor, pharmacist, or health care provider.  2021 Elsevier/Gold Standard (2018-06-08 11:41:38)

## 2020-11-07 NOTE — Progress Notes (Signed)
Electrophysiology Office Note   Date:  11/07/2020   ID:  Aaron Ruiz, DOB Jun 17, 1956, MRN [id number]  PCP:  [name], NP  Cardiologist:  [name] Primary Electrophysiologist:  [name], MD    Chief Complaint: AF   History of Present Illness: Aaron Ruiz is a 65 y.o. male who is being seen today for the evaluation of AF at the request of [name], [name], DO. Presenting today for electrophysiology evaluation.  She has a history significant for hypertension, hyperlipidemia, and atrial fibrillation.  She initially presented with atrial fibrillation 10/03/20 to med [facility] when she had palpitations.  She was started on Eliquis with a CHA2DS2-VASc of 2.  Prior to her emergency room visit, she wore a cardiac monitor that showed episodes of atrial fibrillation.  Today, he denies symptoms of palpitations, chest pain, shortness of breath, orthopnea, PND, lower extremity edema, claudication, dizziness, presyncope, syncope, bleeding, or neurologic sequela. The patient is tolerating medications without difficulties.  Over the past few months, he is had episodes of atrial fibrillation multiple times a week.  He says they last a few hours at a time.  He has palpitations and fatigue associated with his atrial fibrillation.  He does not have much shortness of breath.   Past Medical History:  Diagnosis Date  . Actinic keratosis 03/20/2009   Qualifier: Diagnosis of  By: [name]   . Allergic conjunctivitis 06/30/2018  . Anemia   . ANEMIA-NOS 02/06/2009   Qualifier: Diagnosis of  By: [name], [name]    . Asthma    uses inhaler  . Basal cell carcinoma 2017   left side of nose  . Basal cell carcinoma of nose 10/16/2020  . Borderline diabetes 05/03/2011  . BPH (benign prostatic hyperplasia) 07/29/2014  . Chemosis of conjunctiva of both eyes 01/15/2017  . Colon polyps   . Daytime somnolence 09/21/2020  . Diverticulosis   . Essential hypertension 02/06/2009    Qualifier: Diagnosis of  By: [name], [name]     . Fatigue 09/21/2020  . Former smoker 09/21/2020  . General medical examination 11/14/2010  . GERD 02/06/2009   Qualifier: Diagnosis of  By: [name], [name]    . GERD (gastroesophageal reflux disease)    on meds  . High cholesterol 02/06/2009   Qualifier: Diagnosis of  By: [name], [name]    . Hx of adenomatous colonic polyps 03/07/2009  . Hx of eye surgery 2018   "Left Eye was swollen and had procedure to remove excess swelling"  . Hyperglycemia 05/01/2011  . Hyperlipidemia   . Hypertension   . Hypogonadism male 04/30/2011  . Lactose intolerance   . Lentigo 10/16/2020  . Mild intermittent asthma 06/30/2018  . Neoplasm of uncertain behavior of skin 10/16/2020  . Obesity (BMI 30-39.9) 09/21/2020  . Osteoarthritis 09/24/2011  . OVERWEIGHT 02/06/2009   Qualifier: Diagnosis of  By: [name]   . Palpitations 09/21/2020  . Paroxysmal atrial fibrillation ([place]) 10/06/2020  . Rash/urticaria 08/07/2012  . Recurrent UTI 07/30/2013  . Routine general medical examination at a health care facility 12/16/2011  . Secondary hypercoagulable state ([place]) 10/06/2020  . SKIN LESION 03/20/2009   Qualifier: Diagnosis of  By: [name]   . Snoring 09/21/2020  . Unspecified asthma(493.90) 02/06/2009   Centricity Description: ASTHMA Qualifier: Diagnosis of  By: [name], [name]   Centricity Description: ASTHMA, INTERMITTENT, MILD Qualifier: Diagnosis of  By: [name], [name]   .  URI (upper respiratory infection) 07/23/2011  . Vitamin D deficiency 09/21/2020   Past Surgical History:  Procedure Laterality Date  . CYSTECTOMY  05/2004   removed fromback /sebaceous cyst  . [place]   left  . mohs procedure  2017   left side of nose  . NASAL SEPTUM SURGERY       Current Outpatient Medications  Medication Sig Dispense Refill  . acetaminophen (TYLENOL) 500 MG tablet Take 1 tablet (500 mg total) by mouth every 6 (six)  hours as needed. 30 tablet 5  . amLODipine (NORVASC) 10 MG tablet TAKE 1 TABLET(10 MG) BY MOUTH DAILY 90 tablet 1  . apixaban (ELIQUIS) 5 MG TABS tablet Take 1 tablet (5 mg total) by mouth 2 (two) times daily. 60 tablet 5  . cetirizine (ZYRTEC) 10 MG tablet Take 10 mg by mouth daily.    [name] guaiFENesin (MUCINEX) 600 MG 12 hr tablet Take 600 mg by mouth 2 (two) times daily as needed.    . hydrochlorothiazide (HYDRODIURIL) 25 MG tablet Take 1 tablet (25 mg total) by mouth daily. 90 tablet 0  . losartan (COZAAR) 100 MG tablet TAKE 1 TABLET(100 MG) BY MOUTH DAILY 90 tablet 1  . metoprolol succinate (TOPROL-XL) 25 MG 24 hr tablet Take 1 tablet (25 mg total) by mouth daily. 90 tablet 1  . Omega-3 Fatty Acids (FISH OIL) 1200 MG CAPS Take by mouth.    [name] omeprazole (PRILOSEC) 40 MG capsule TAKE 1 CAPSULE(40 MG) BY MOUTH DAILY 90 capsule 1  . pravastatin (PRAVACHOL) 40 MG tablet Take 1 tablet (40 mg total) by mouth daily. 90 tablet 1  . tamsulosin (FLOMAX) 0.4 MG CAPS capsule TAKE 1 CAPSULE(0.4 MG) BY MOUTH DAILY 90 capsule 1  . Vitamin D, Ergocalciferol, (DRISDOL) 1.25 MG (50000 UNIT) CAPS capsule Take 1 capsule (50,000 Units total) by mouth every 7 (seven) days. 12 capsule 0  . Zoster Vaccine Adjuvanted [facility]) injection Inject 0.5mg  IM now and again in 2-6 months. 0.5 mL 1   No current facility-administered medications for this visit.    Allergies:   Sulfonamide derivatives   Social History:  The patient  reports that he has quit smoking. His smoking use included cigarettes. He has never used smokeless tobacco. He reports that he does not drink alcohol and does not use drugs.   Family History:  The patient's family history includes Cancer in his brother; Colon cancer in his maternal aunt; Coronary artery disease in his father; Diabetes in his father; Hypertension in an other family member; Kidney disease in an other family member.    ROS:  Please see the history of present illness.   Otherwise,  review of systems is positive for none.   All other systems are reviewed and negative.    PHYSICAL EXAM: VS:  BP (!) 146/64   Pulse 68   Ht 5\' 7"  (1.702 m)   Wt 201 lb (91.2 kg)   SpO2 96%   BMI 31.48 kg/m  , BMI Body mass index is 31.48 kg/m. GEN: Well nourished, well developed, in no acute distress  HEENT: normal  Neck: no JVD, carotid bruits, or masses Cardiac: RRR; no murmurs, rubs, or gallops,no edema  Respiratory:  clear to auscultation bilaterally, normal work of breathing GI: soft, nontender, nondistended, + BS MS: no deformity or atrophy  Skin: warm and dry Neuro:  Strength and sensation are intact Psych: euthymic mood, full affect  EKG:  EKG is not ordered today. Personal review  of the ekg ordered 10/06/20 shows sinus rhythm  Recent Labs: 10/03/2020: ALT 20; B Natriuretic Peptide 34.0; BUN 19; Creatinine, Ser 0.92; Hemoglobin 14.9; Platelets 141; Potassium 3.5; Sodium 136; TSH 1.479    Lipid Panel     Component Value Date/Time   CHOL 188 09/11/2020 1232   TRIG 378.0 (H) 09/11/2020 1232   HDL 40.00 09/11/2020 1232   CHOLHDL 5 09/11/2020 1232   VLDL 75.6 (H) 09/11/2020 1232   LDLCALC 80 12/31/2018 0901   LDLDIRECT 116.0 09/11/2020 1232     Wt Readings from Last 3 Encounters:  11/07/20 201 lb (91.2 kg)  10/17/20 201 lb 1.3 oz (91.2 kg)  10/06/20 200 lb (90.7 kg)      Other studies Reviewed: Additional studies/ records that were reviewed today include: TTE 11/01/20  Review of the above records today demonstrates:  1. Left ventricular ejection fraction, by estimation, is 60 to 65%. The  left ventricle has normal function. The left ventricle has no regional  wall motion abnormalities. Left ventricular diastolic parameters were  normal.  2. Right ventricular systolic function is normal. The right ventricular  size is normal.  3. The mitral valve is normal in structure. Trivial mitral valve  regurgitation. No evidence of mitral stenosis.  4. The aortic  valve is normal in it this point, he is not quite ready for ablation structure. Aortic valve regurgitation is  not visualized. No aortic stenosis is present.  5. The inferior vena cava is normal in size with greater than 50%  respiratory variability, suggesting right atrial pressure of 3 mmHg.   Cardiac monitor 10/11/2020 personally reviewed This study is remarkable for symptomatic atrial fibrillation/atrial flutter and paroxysmal atrial tachycardia.  ASSESSMENT AND PLAN:  1.  Paroxysmal atrial fibrillation: Currently on Eliquis with a CHA2DS2-VASc of 2.  He would likely benefit from maintenance of sinus rhythm.  We discussed medication options versus ablation.  At this point, he is not quite ready for ablation yet.  He would prefer to be started on medications.  We Maria Gallicchio start flecainide 50 mg today.  Risk, benefits, and alternatives to EP study and radiofrequency ablation for afib were also discussed in detail today. These risks include but are not limited to stroke, bleeding, vascular damage, tamponade, perforation, damage to the esophagus, lungs, and other structures, pulmonary vein stenosis, worsening renal function, and death. The patient understands these risk and wishes to proceed.  We Ryon Layton therefore proceed with catheter ablation at the next available time.  Carto, ICE, anesthesia are requested for the procedure.  Jasin Brazel also obtain CT PV protocol prior to the procedure to exclude LAA thrombus and further evaluate atrial anatomy.  2.  Hypertension: Mildly elevated today.  Has been well controlled at home.  No changes.  3.  Obesity: Diet and exercise encouraged  Case discussed with primary cardiology  Current medicines are reviewed at length with the patient today.   The patient does not have concerns regarding his medicines.  The following changes were made today: Start flecainide  Labs/ tests ordered today include:  No orders of the defined types were placed in this  encounter.    Disposition:   FU with Azharia Surratt 3 months  Signed, Ifeanyichukwu Wickham Meredith Leeds, MD  11/07/2020 3:57 PM     [place] [address] [place] [address] [phone] (office) [phone] (fax)

## 2020-11-16 ENCOUNTER — Ambulatory Visit: Payer: Medicare Other | Admitting: Cardiology

## 2020-11-28 ENCOUNTER — Other Ambulatory Visit: Payer: Self-pay | Admitting: Family

## 2021-01-14 ENCOUNTER — Other Ambulatory Visit: Payer: Self-pay | Admitting: Family

## 2021-02-14 ENCOUNTER — Other Ambulatory Visit: Payer: Self-pay | Admitting: Family

## 2021-02-26 ENCOUNTER — Ambulatory Visit: Payer: Medicare Other | Admitting: Cardiology

## 2021-02-26 ENCOUNTER — Other Ambulatory Visit: Payer: Self-pay

## 2021-02-26 ENCOUNTER — Encounter: Payer: Self-pay | Admitting: Cardiology

## 2021-02-26 VITALS — BP 130/72 | HR 69 | Ht 67.0 in | Wt 200.0 lb

## 2021-02-26 DIAGNOSIS — I480 Paroxysmal atrial fibrillation: Secondary | ICD-10-CM

## 2021-02-26 MED ORDER — FLECAINIDE ACETATE 50 MG PO TABS: 50.0000 mg | ORAL_TABLET | Freq: Two times a day (BID) | ORAL | 2 refills | Status: DC

## 2021-02-26 MED ORDER — APIXABAN 5 MG PO TABS: 5.0000 mg | ORAL_TABLET | Freq: Two times a day (BID) | ORAL | 5 refills | Status: DC

## 2021-02-26 NOTE — Progress Notes (Signed)
Electrophysiology Office Note   Date:  02/26/2021   ID:  Aaron Ruiz, DOB [birth year], MRN [id number]  PCP:  Aaron Alar, NP  Cardiologist:  [name] Primary Electrophysiologist:  Aaron Cimo Meredith Leeds, MD    Chief Complaint: AF   History of Present Illness: Aaron Ruiz is a 65 y.o. male who is being seen today for the evaluation of AF at the request of Aaron Alar, NP. Presenting today for electrophysiology evaluation.  He has a history significant for hypertension, hyperlipidemia, and atrial fibrillation.  He was initially diagnosed with atrial fibrillation for 522 when he went to [place] with palpitations.  He was started on Eliquis.  Prior to his emergency room visit, he wore a cardiac monitor that showed atrial fibrillation.  He had continued to have symptoms of weakness and fatigue.  He was started on flecainide at his last visit.  Today, denies symptoms of palpitations, chest pain, shortness of breath, orthopnea, PND, lower extremity edema, claudication, dizziness, presyncope, syncope, bleeding, or neurologic sequela. The patient is tolerating medications without difficulties.  Since his last visit he has done well.  He has had no chest pain or shortness of breath.  He is also not had any palpitations.  He currently feels well without complaint.  He has noted no further episodes of atrial fibrillation.   Past Medical History:  Diagnosis Date   Actinic keratosis 03/20/2009   Qualifier: Diagnosis of  By: Shawna Orleans DO, D. 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   Colon polyps    Daytime somnolence 09/21/2020    Diverticulosis    Essential hypertension 02/06/2009   Qualifier: Diagnosis of  By: [name], [name]      Fatigue 09/21/2020   Former smoker 09/21/2020   General medical examination 11/14/2010   GERD 02/06/2009   Qualifier: Diagnosis of  By: [name], [name]     GERD (gastroesophageal reflux disease)    on meds   High cholesterol 02/06/2009   Qualifier: Diagnosis of  By: [name], [name]     Hx of adenomatous colonic polyps 03/07/2009   Hx of eye surgery 2018   "Left Eye was swollen and had procedure to remove excess swelling"   Hyperglycemia 05/01/2011   Hyperlipidemia    Hypertension    Hypogonadism male 04/30/2011   Lactose intolerance    Lentigo 10/16/2020   Mild intermittent asthma 06/30/2018   Neoplasm of uncertain behavior of skin 10/16/2020   Obesity (BMI 30-39.9) 09/21/2020   Osteoarthritis 09/24/2011   OVERWEIGHT 02/06/2009   Qualifier: Diagnosis of  By: [name]    Palpitations 09/21/2020   Paroxysmal atrial fibrillation ([place]) 10/06/2020   Rash/urticaria 08/07/2012   Recurrent UTI 07/30/2013   Routine general medical examination at a health care facility 12/16/2011   Secondary hypercoagulable state ([place]) 10/06/2020   SKIN LESION 03/20/2009   Qualifier: Diagnosis of  By: [name]    Snoring 09/21/2020   Unspecified asthma(493.90) 02/06/2009   Centricity Description: ASTHMA Qualifier: Diagnosis of  By: [name], [name]   Centricity Description: ASTHMA, INTERMITTENT,  MILD Qualifier: Diagnosis of  By: [name], [name] S    URI (upper respiratory infection) 07/23/2011   Vitamin D deficiency 09/21/2020   Past Surgical History:  Procedure Laterality Date   CYSTECTOMY  05/2004   removed fromback /sebaceous cyst   INGUINAL HERNIA REPAIR  1995   left   mohs procedure  2017   left side of nose   NASAL SEPTUM SURGERY       Current Outpatient Medications  Medication Sig Dispense Refill   acetaminophen (TYLENOL) 500 MG tablet Take 1 tablet (500 mg total) by  mouth every 6 (six) hours as needed. 30 tablet 5   amLODipine (NORVASC) 10 MG tablet TAKE 1 TABLET(10 MG) BY MOUTH DAILY 90 tablet 0   cetirizine (ZYRTEC) 10 MG tablet Take 10 mg by mouth daily.     guaiFENesin (MUCINEX) 600 MG 12 hr tablet Take 600 mg by mouth 2 (two) times daily as needed.     hydrochlorothiazide (HYDRODIURIL) 25 MG tablet TAKE 1 TABLET(25 MG) BY MOUTH DAILY 90 tablet 0   losartan (COZAAR) 100 MG tablet TAKE 1 TABLET(100 MG) BY MOUTH DAILY 90 tablet 1   metoprolol succinate (TOPROL-XL) 25 MG 24 hr tablet Take 1 tablet (25 mg total) by mouth daily. 90 tablet 1   Omega-3 Fatty Acids (FISH OIL) 1200 MG CAPS Take by mouth.     omeprazole (PRILOSEC) 40 MG capsule TAKE 1 CAPSULE(40 MG) BY MOUTH DAILY 90 capsule 1   pravastatin (PRAVACHOL) 40 MG tablet TAKE 1 TABLET(40 MG) BY MOUTH DAILY 90 tablet 1   tamsulosin (FLOMAX) 0.4 MG CAPS capsule TAKE 1 CAPSULE(0.4 MG) BY MOUTH DAILY 90 capsule 1   Vitamin D, Ergocalciferol, (DRISDOL) 1.25 MG (50000 UNIT) CAPS capsule Take 1 capsule (50,000 Units total) by mouth every 7 (seven) days. 12 capsule 0   Zoster Vaccine Adjuvanted [facility]) injection Inject 0.'5mg'$  IM now and again in 2-6 months. 0.5 mL 1   apixaban (ELIQUIS) 5 MG TABS tablet Take 1 tablet (5 mg total) by mouth 2 (two) times daily. 60 tablet 5   flecainide (TAMBOCOR) 50 MG tablet Take 1 tablet (50 mg total) by mouth 2 (two) times daily. 180 tablet 2   No current facility-administered medications for this visit.    Allergies:   Sulfonamide derivatives   Social History:  The patient  reports that he has quit smoking. His smoking use included cigarettes. He has never used smokeless tobacco. He reports that he does not drink alcohol and does not use drugs.   Family History:  The patient's family history includes Cancer in his brother; Colon cancer in his maternal aunt; Coronary artery disease in his father; Diabetes in his father; Hypertension in an other family member; Kidney  disease in an other family member.   ROS:  Please see the history of present illness.   Otherwise, review of systems is positive for none.   All other systems are reviewed and negative.   PHYSICAL EXAM: VS:  BP 130/72   Pulse 69   Ht '5\' 7"'$  (1.702 m)   Wt 200 lb (90.7 kg)   SpO2 98%   BMI 31.32 kg/m  , BMI Body mass index is 31.32 kg/m. GEN: Well nourished, well developed, in no acute distress  HEENT: normal  Neck: no JVD, carotid bruits, or masses Cardiac: RRR; no murmurs, rubs, or gallops,no edema  Respiratory:  clear to auscultation bilaterally, normal work of breathing GI: soft, nontender, nondistended, + BS MS: no deformity or  atrophy  Skin: warm and dry Neuro:  Strength and sensation are intact Psych: euthymic mood, full affect  EKG:  EKG is ordered today. Personal review of the ekg ordered shows sinus rhythm  Recent Labs: 10/03/2020: ALT 20; B Natriuretic Peptide 34.0; BUN 19; Creatinine, Ser 0.92; Hemoglobin 14.9; Platelets 141; Potassium 3.5; Sodium 136; TSH 1.479    Lipid Panel     Component Value Date/Time   CHOL 188 09/11/2020 1232   TRIG 378.0 (H) 09/11/2020 1232   HDL 40.00 09/11/2020 1232   CHOLHDL 5 09/11/2020 1232   VLDL 75.6 (H) 09/11/2020 1232   LDLCALC 80 12/31/2018 0901   LDLDIRECT 116.0 09/11/2020 1232     Wt Readings from Last 3 Encounters:  02/26/21 200 lb (90.7 kg)  11/07/20 201 lb (91.2 kg)  10/17/20 201 lb 1.3 oz (91.2 kg)      Other studies Reviewed: Additional studies/ records that were reviewed today include: TTE 11/01/20  Review of the above records today demonstrates:   1. Left ventricular ejection fraction, by estimation, is 60 to 65%. The  left ventricle has normal function. The left ventricle has no regional  wall motion abnormalities. Left ventricular diastolic parameters were  normal.   2. Right ventricular systolic function is normal. The right ventricular  size is normal.   3. The mitral valve is normal in structure.  Trivial mitral valve  regurgitation. No evidence of mitral stenosis.   4. The aortic valve is normal in it this point, he is not quite ready for ablation structure. Aortic valve regurgitation is  not visualized. No aortic stenosis is present.   5. The inferior vena cava is normal in size with greater than 50%  respiratory variability, suggesting right atrial pressure of 3 mmHg.   Cardiac monitor 10/11/2020 personally reviewed This study is remarkable for symptomatic atrial fibrillation/atrial flutter and paroxysmal atrial tachycardia.  ASSESSMENT AND PLAN:  1.  Paroxysmal atrial fibrillation: Currently on Eliquis, flecainide, Toprol-XL.  CHA2DS2-VASc of 2.  He has fortunately had minimal episodes of atrial fibrillation.  He currently feels well without complaint.  No changes.  2.  Hypertension: Currently well controlled  3.  Obesity: Diet and exercise encouraged.  Current medicines are reviewed at length with the patient today.   The patient does not have concerns regarding his medicines.  The following changes were made today: None  Labs/ tests ordered today include:  Orders Placed This Encounter  Procedures   EKG 12-Lead      Disposition:   FU with Sie Formisano 6 months  Signed, Torunn Chancellor Meredith Leeds, MD  02/26/2021 4:23 PM     [place] [address] [phone] (office) [phone] (fax)

## 2021-02-26 NOTE — Patient Instructions (Signed)

## 2021-02-27 ENCOUNTER — Other Ambulatory Visit: Payer: Self-pay | Admitting: Family

## 2021-05-18 ENCOUNTER — Ambulatory Visit (INDEPENDENT_AMBULATORY_CARE_PROVIDER_SITE_OTHER): Payer: Medicare Other

## 2021-05-18 VITALS — Ht 67.0 in | Wt 201.0 lb

## 2021-05-18 DIAGNOSIS — Z0000 Encounter for general adult medical examination without abnormal findings: Secondary | ICD-10-CM

## 2021-05-18 NOTE — Progress Notes (Signed)
Subjective:   Aaron Ruiz is a 65 y.o. male who presents for an Initial Medicare Annual Wellness Visit.  I connected with Rishik today by telephone and verified that I am speaking with the correct person using two identifiers. Location patient: home Location provider: work Persons participating in the virtual visit: patient, Marine scientist.    I discussed the limitations, risks, security and privacy concerns of performing an evaluation and management service by telephone and the availability of in person appointments. I also discussed with the patient that there may be a patient responsible charge related to this service. The patient expressed understanding and verbally consented to this telephonic visit.    Interactive audio and video telecommunications were attempted between this provider and patient, however failed, due to patient having technical difficulties OR patient did not have access to video capability.  We continued and completed visit with audio only.  Some vital signs may be absent or patient reported.   Time Spent with patient on telephone encounter: 40 minutes   Review of Systems     Cardiac Risk Factors include: advanced age (>79men, >52 women);hypertension;dyslipidemia;obesity (BMI >30kg/m2);male gender     Objective:    Today's Vitals   05/18/21 0942 05/18/21 0943  Weight: 201 lb (91.2 kg)   Height: 5\' 7"  (1.702 m)   PainSc:  2    Body mass index is 31.48 kg/m.  Advanced Directives 05/18/2021 10/03/2020 10/27/2014  Does Patient Have a Medical Advance Directive? Yes No Yes  Type of Advance Directive Living will - Living will  Copy of [place] in Chart? No - copy requested - -  Would patient like information on creating a medical advance directive? - No - Patient declined -    Current Medications (verified) Outpatient Encounter Medications as of 05/18/2021  Medication Sig   acetaminophen (TYLENOL) 500 MG tablet Take 1 tablet (500 mg total)  by mouth every 6 (six) hours as needed.   amLODipine (NORVASC) 10 MG tablet TAKE 1 TABLET(10 MG) BY MOUTH DAILY   cetirizine (ZYRTEC) 10 MG tablet Take 10 mg by mouth daily.   flecainide (TAMBOCOR) 50 MG tablet Take 1 tablet (50 mg total) by mouth 2 (two) times daily.   guaiFENesin (MUCINEX) 600 MG 12 hr tablet Take 600 mg by mouth 2 (two) times daily as needed.   hydrochlorothiazide (HYDRODIURIL) 25 MG tablet TAKE 1 TABLET(25 MG) BY MOUTH DAILY   losartan (COZAAR) 100 MG tablet TAKE 1 TABLET(100 MG) BY MOUTH DAILY   metoprolol succinate (TOPROL-XL) 25 MG 24 hr tablet Take 1 tablet (25 mg total) by mouth daily.   Omega-3 Fatty Acids (FISH OIL) 1200 MG CAPS Take by mouth.   omeprazole (PRILOSEC) 40 MG capsule TAKE 1 CAPSULE(40 MG) BY MOUTH DAILY   pravastatin (PRAVACHOL) 40 MG tablet TAKE 1 TABLET(40 MG) BY MOUTH DAILY   tamsulosin (FLOMAX) 0.4 MG CAPS capsule TAKE 1 CAPSULE(0.4 MG) BY MOUTH DAILY   Vitamin D, Ergocalciferol, (DRISDOL) 1.25 MG (50000 UNIT) CAPS capsule Take 1 capsule (50,000 Units total) by mouth every 7 (seven) days.   apixaban (ELIQUIS) 5 MG TABS tablet Take 1 tablet (5 mg total) by mouth 2 (two) times daily.   Zoster Vaccine Adjuvanted [facility]) injection Inject 0.5mg  IM now and again in 2-6 months. (Patient not taking: Reported on 05/18/2021)   No facility-administered encounter medications on file as of 05/18/2021.    Allergies (verified) Sulfonamide derivatives   History: Past Medical History:  Diagnosis Date   Actinic  keratosis 03/20/2009   Qualifier: Diagnosis of  By: Shawna Orleans DO, D. 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   Colon  polyps    Daytime somnolence 09/21/2020   Diverticulosis    Essential hypertension 02/06/2009   Qualifier: Diagnosis of  By: [name], [name]      Fatigue 09/21/2020   Former smoker 09/21/2020   General medical examination 11/14/2010   GERD 02/06/2009   Qualifier: Diagnosis of  By: [name], [name]     GERD (gastroesophageal reflux disease)    on meds   High cholesterol 02/06/2009   Qualifier: Diagnosis of  By: [name], [name]     Hx of adenomatous colonic polyps 03/07/2009   Hx of eye surgery 2018   "Left Eye was swollen and had procedure to remove excess swelling"   Hyperglycemia 05/01/2011   Hyperlipidemia    Hypertension    Hypogonadism male 04/30/2011   Lactose intolerance    Lentigo 10/16/2020   Mild intermittent asthma 06/30/2018   Neoplasm of uncertain behavior of skin 10/16/2020   Obesity (BMI 30-39.9) 09/21/2020   Osteoarthritis 09/24/2011   OVERWEIGHT 02/06/2009   Qualifier: Diagnosis of  By: [name]    Palpitations 09/21/2020   Paroxysmal atrial fibrillation ([place]) 10/06/2020   Rash/urticaria 08/07/2012   Recurrent UTI 07/30/2013   Routine general medical examination at a health care facility 12/16/2011   Secondary hypercoagulable state ([place]) 10/06/2020   SKIN LESION 03/20/2009   Qualifier: Diagnosis of  By: [name]    Snoring 09/21/2020   Unspecified asthma(493.90) 02/06/2009   Centricity Description: ASTHMA Qualifier: Diagnosis of  By: [name], [name]   Centricity Description: ASTHMA, INTERMITTENT, MILD Qualifier: Diagnosis of  By: [name], [name] S    URI (upper respiratory infection) 07/23/2011   Vitamin D deficiency 09/21/2020   Past Surgical History:  Procedure Laterality Date   CYSTECTOMY  05/2004   removed fromback /sebaceous cyst   INGUINAL HERNIA REPAIR  1995   left   mohs procedure  2017   left side of nose   NASAL SEPTUM SURGERY     Family History  Problem Relation Age of Onset   Coronary artery disease Father        CABG X5    Diabetes Father    Colon cancer Maternal Aunt    Cancer Brother        lung (smoker)   Hypertension Other    Kidney disease Other    Social History   Socioeconomic History   Marital status: Married    Spouse name: Not on file   Number of children: Not on file   Years of education: Not on file   Highest education level: Not on file  Occupational History   Not on file  Tobacco Use   Smoking status: Former    Types: Cigarettes   Smokeless tobacco: Never  Vaping Use   Vaping Use: Never used  Substance and Sexual Activity   Alcohol use: No    Alcohol/week: 0.0 standard drinks   Drug use: No   Sexual activity:  Not on file  Other Topics Concern   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[employer])   Married 52 years    1  daughter 59 - [name] (lives in [place])   1  son 4    Retired   Alcohol use-no   Smoking Status:  quit   Caffeine use/day:  2-3 beverages daily   Social Determinants of Radio broadcast assistant Strain: Low Risk    Difficulty of Paying Living Expenses: Not hard at all  Food Insecurity: No Food Insecurity   Worried About Charity fundraiser in the Last Year: Never true   Arboriculturist in the Last Year: Never true  Transportation Needs: No Transportation Needs   Lack of Transportation (Medical): No   Lack of Transportation (Non-Medical): No  Physical Activity: Sufficiently Active   Days of Exercise per Week: 5 days   Minutes of Exercise per Session: 30 min  Stress: No Stress Concern Present   Feeling of Stress : Not at all  Social Connections: Moderately Integrated   Frequency of Communication with Friends and Family: More than three times a week   Frequency of Social Gatherings with Friends and Family: More than three times a week   Attends Religious Services: More than 4 times per year   Active Member of [employer] or Organizations: No   Attends Music therapist: Never   Marital Status: Married     Tobacco Counseling Counseling given: Not Answered   Clinical Intake:  Pre-visit preparation completed: Yes  Pain : 0-10 Pain Score: 2  Pain Type: Chronic pain Pain Location: Leg Pain Onset: More than a month ago Pain Frequency: Intermittent     BMI - recorded: 31.48 Nutritional Risks: None Diabetes: No  How often do you need to have someone help you when you read instructions, pamphlets, or other written materials from your doctor or pharmacy?: 1 - Never  Diabetic?No  Interpreter Needed?: No  Information entered by :: Caroleen Hamman LPN   Activities of Daily Living In your present state of health, do you have any difficulty performing the following activities: 05/18/2021 09/11/2020  Hearing? N N  Vision? N N  Difficulty concentrating or making decisions? N N  Walking or climbing stairs? N N  Dressing or bathing? N N  Doing errands, shopping? N N  Preparing Food and eating ? N -  Using the Toilet? N -  In the past six months, have you accidently leaked urine? N -  Do you have problems with loss of bowel control? N -  Managing your Medications? N -  Managing your Finances? N -  Housekeeping or managing your Housekeeping? N -  Some recent data might be hidden    Patient Care Team: [name], NP as PCP - General (Internal Medicine) Berniece Salines, DO as PCP - Cardiology (Cardiology)  Indicate any recent Medical Services you may have received from other than [facility] providers in the past year (date may be approximate).     Assessment:   This is a routine wellness examination for Kysean.  Hearing/Vision screen Hearing Screening - Comments:: Bilateral hearing aids Vision Screening - Comments:: Wears contacts Last eye exam-09/2020-[facility]  Dietary issues and exercise activities discussed: Current Exercise Habits: Home exercise routine, Type of exercise: walking, Time (Minutes): 30, Frequency (Times/Week): 5, Weekly Exercise (Minutes/Week): 150,  Intensity: Mild, Exercise limited by: orthopedic condition(s) (leg pain)   Goals Addressed  This Visit's Progress    Patient Stated       Maintain current health       Depression Screen PHQ 2/9 Scores 05/18/2021 09/11/2020 12/05/2017 11/22/2016  PHQ - 2 Score 0 0 0 0  PHQ- 9 Score - - 1 1    Fall Risk Fall Risk  05/18/2021  Falls in the past year? 0  Number falls in past yr: 0  Injury with Fall? 0  Follow up Falls prevention discussed    FALL RISK PREVENTION PERTAINING TO THE HOME:  Any stairs in or around the home? Yes  If so, are there any without handrails? No  Home free of loose throw rugs in walkways, pet beds, electrical cords, etc? Yes  Adequate lighting in your home to reduce risk of falls? Yes   ASSISTIVE DEVICES UTILIZED TO PREVENT FALLS:  Life alert? No  Use of a cane, walker or w/c? No  Grab bars in the bathroom? Yes  Shower chair or bench in shower? Yes  Elevated toilet seat or a handicapped toilet? No   TIMED UP AND GO:  Was the test performed? No . Phone visit   Cognitive Function:Normal cognitive status assessed by this Nurse Health Advisor. No abnormalities found.          Immunizations Immunization History  Administered Date(s) Administered   Influenza Split 04/28/2012, 04/30/2020, 04/01/2021   Influenza,inj,Quad PF,6+ Mos 04/19/2014, 03/24/2016, 03/23/2017, 05/03/2018, 03/30/2019   Influenza-Unspecified 03/31/2013, 04/01/2015, 03/24/2016, 05/03/2018   PFIZER(Purple Top)SARS-COV-2 Vaccination 08/27/2019, 09/22/2019   Pneumococcal Polysaccharide-23 01/11/2020   Td 01/11/2010   Tdap 03/30/2019   Zoster Recombinat (Shingrix) 04/01/2021   Zoster, Live 01/05/2014    TDAP status: Up to date  Flu Vaccine status: Up to date  Pneumococcal vaccine status: Due, Education has been provided regarding the importance of this vaccine. Advised may receive this vaccine at local pharmacy or Health Dept. Aware to provide a copy of the  vaccination record if obtained from local pharmacy or Health Dept. Verbalized acceptance and understanding.  Covid-19 vaccine status: Information provided on how to obtain vaccines.   Qualifies for Shingles Vaccine? Yes   Zostavax completed Yes   Shingrix Completed?: No.    Education has been provided regarding the importance of this vaccine. Patient has been advised to call insurance company to determine out of pocket expense if they have not yet received this vaccine. Advised may also receive vaccine at local pharmacy or Health Dept. Verbalized acceptance and understanding.  Screening Tests Health Maintenance  Topic Date Due   COVID-19 Vaccine (3 - Pfizer risk series) 10/20/2019   Pneumonia Vaccine 80+ Years old (2 - PCV) 01/10/2021   Zoster Vaccines- Shingrix (2 of 2) 05/27/2021   COLONOSCOPY (Pts 45-60yrs Insurance coverage will need to be confirmed)  10/26/2024   TETANUS/TDAP  03/29/2029   INFLUENZA VACCINE  Completed   Hepatitis C Screening  Completed   HPV VACCINES  Aged Out    Health Maintenance  Health Maintenance Due  Topic Date Due   COVID-19 Vaccine (3 - Pfizer risk series) 10/20/2019   Pneumonia Vaccine 37+ Years old (2 - PCV) 01/10/2021    Colorectal cancer screening: Type of screening: Colonoscopy. Completed 10/27/2014. Repeat every 10 years  Lung Cancer Screening: (Low Dose CT Chest recommended if Age 31-80 years, 30 pack-year currently smoking OR have quit w/in 15years.) does not qualify.     Additional Screening:  Hepatitis C Screening: Completed 12/25/2015  Vision Screening: Recommended annual ophthalmology exams for early detection  of glaucoma and other disorders of the eye. Is the patient up to date with their annual eye exam?  Yes  Who is the provider or what is the name of the office in which the patient attends annual eye exams? [facility]   Dental Screening: Recommended annual dental exams for proper oral hygiene  Community Resource Referral /  Chronic Care Management: CRR required this visit?  No   CCM required this visit?  No      Plan:     I have personally reviewed and noted the following in the patient's chart:   Medical and social history Use of alcohol, tobacco or illicit drugs  Current medications and supplements including opioid prescriptions. Patient is not currently taking opioid prescriptions. Functional ability and status Nutritional status Physical activity Advanced directives List of other physicians Hospitalizations, surgeries, and ER visits in previous 12 months Vitals Screenings to include cognitive, depression, and falls Referrals and appointments  In addition, I have reviewed and discussed with patient certain preventive protocols, quality metrics, and best practice recommendations. A written personalized care plan for preventive services as well as general preventive health recommendations were provided to patient.   Due to this being a telephonic visit, the after visit summary with patients personalized plan was offered to patient via mail or my-chart.  Patient would like to access on my-chart.   Marta Antu, LPN   21/19/4174  Nurse Perlie Mayo Advisor  Nurse Notes: None

## 2021-05-18 NOTE — Patient Instructions (Signed)
Mr. Aaron Ruiz , Thank you for taking time to complete your Medicare Wellness Visit. I appreciate your ongoing commitment to your health goals. Please review the following plan we discussed and let me know if I can assist you in the future.   Screening recommendations/referrals: Colonoscopy: Completed 10/27/2014-Due 10/26/2024 Recommended yearly ophthalmology/optometry visit for glaucoma screening and checkup Recommended yearly dental visit for hygiene and checkup  Vaccinations: Influenza vaccine: Up to date Pneumococcal vaccine: Due-May obtain vaccine at our office or your local pharmacy. Tdap vaccine: Up to date Shingles vaccine: Discuss with pharmacy   Covid-19: Booster available at the pharmacy  Advanced directives: Please bring a copy of Living Will and/or Healthcare Power of Attorney for your chart.   Conditions/risks identified: See problem list  Next appointment: Follow up in one year for your annual wellness visit.   Preventive Care 66 Years and Older, Male Preventive care refers to lifestyle choices and visits with your health care provider that can promote health and wellness. What does preventive care include? A yearly physical exam. This is also called an annual well check. Dental exams once or twice a year. Routine eye exams. Ask your health care provider how often you should have your eyes checked. Personal lifestyle choices, including: Daily care of your teeth and gums. Regular physical activity. Eating a healthy diet. Avoiding tobacco and drug use. Limiting alcohol use. Practicing safe sex. Taking low doses of aspirin every day. Taking vitamin and mineral supplements as recommended by your health care provider. What happens during an annual well check? The services and screenings done by your health care provider during your annual well check will depend on your age, overall health, lifestyle risk factors, and family history of disease. Counseling  Your health care  provider may ask you questions about your: Alcohol use. Tobacco use. Drug use. Emotional well-being. Home and relationship well-being. Sexual activity. Eating habits. History of falls. Memory and ability to understand (cognition). Work and work Statistician. Screening  You may have the following tests or measurements: Height, weight, and BMI. Blood pressure. Lipid and cholesterol levels. These may be checked every 5 years, or more frequently if you are over 78 years old. Skin check. Lung cancer screening. You may have this screening every year starting at age 37 if you have a 30-pack-year history of smoking and currently smoke or have quit within the past 15 years. Fecal occult blood test (FOBT) of the stool. You may have this test every year starting at age 54. Flexible sigmoidoscopy or colonoscopy. You may have a sigmoidoscopy every 5 years or a colonoscopy every 10 years starting at age 63. Prostate cancer screening. Recommendations will vary depending on your family history and other risks. Hepatitis C blood test. Hepatitis B blood test. Sexually transmitted disease (STD) testing. Diabetes screening. This is done by checking your blood sugar (glucose) after you have not eaten for a while (fasting). You may have this done every 1-3 years. Abdominal aortic aneurysm (AAA) screening. You may need this if you are a current or former smoker. Osteoporosis. You may be screened starting at age 85 if you are at high risk. Talk with your health care provider about your test results, treatment options, and if necessary, the need for more tests. Vaccines  Your health care provider may recommend certain vaccines, such as: Influenza vaccine. This is recommended every year. Tetanus, diphtheria, and acellular pertussis (Tdap, Td) vaccine. You may need a Td booster every 10 years. Zoster vaccine. You may need this after  age 46. Pneumococcal 13-valent conjugate (PCV13) vaccine. One dose is  recommended after age 54. Pneumococcal polysaccharide (PPSV23) vaccine. One dose is recommended after age 25. Talk to your health care provider about which screenings and vaccines you need and how often you need them. This information is not intended to replace advice given to you by your health care provider. Make sure you discuss any questions you have with your health care provider. Document Released: 07/14/2015 Document Revised: 03/06/2016 Document Reviewed: 04/18/2015 Elsevier Interactive Patient Education  2017 [employer] Prevention in the Home Falls can cause injuries. They can happen to people of all ages. There are many things you can do to make your home safe and to help prevent falls. What can I do on the outside of my home? Regularly fix the edges of walkways and driveways and fix any cracks. Remove anything that might make you trip as you walk through a door, such as a raised step or threshold. Trim any bushes or trees on the path to your home. Use bright outdoor lighting. Clear any walking paths of anything that might make someone trip, such as rocks or tools. Regularly check to see if handrails are loose or broken. Make sure that both sides of any steps have handrails. Any raised decks and porches should have guardrails on the edges. Have any leaves, snow, or ice cleared regularly. Use sand or salt on walking paths during winter. Clean up any spills in your garage right away. This includes oil or grease spills. What can I do in the bathroom? Use night lights. Install grab bars by the toilet and in the tub and shower. Do not use towel bars as grab bars. Use non-skid mats or decals in the tub or shower. If you need to sit down in the shower, use a plastic, non-slip stool. Keep the floor dry. Clean up any water that spills on the floor as soon as it happens. Remove soap buildup in the tub or shower regularly. Attach bath mats securely with double-sided non-slip rug  tape. Do not have throw rugs and other things on the floor that can make you trip. What can I do in the bedroom? Use night lights. Make sure that you have a light by your bed that is easy to reach. Do not use any sheets or blankets that are too big for your bed. They should not hang down onto the floor. Have a firm chair that has side arms. You can use this for support while you get dressed. Do not have throw rugs and other things on the floor that can make you trip. What can I do in the kitchen? Clean up any spills right away. Avoid walking on wet floors. Keep items that you use a lot in easy-to-reach places. If you need to reach something above you, use a strong step stool that has a grab bar. Keep electrical cords out of the way. Do not use floor polish or wax that makes floors slippery. If you must use wax, use non-skid floor wax. Do not have throw rugs and other things on the floor that can make you trip. What can I do with my stairs? Do not leave any items on the stairs. Make sure that there are handrails on both sides of the stairs and use them. Fix handrails that are broken or loose. Make sure that handrails are as long as the stairways. Check any carpeting to make sure that it is firmly attached to the stairs.  Fix any carpet that is loose or worn. Avoid having throw rugs at the top or bottom of the stairs. If you do have throw rugs, attach them to the floor with carpet tape. Make sure that you have a light switch at the top of the stairs and the bottom of the stairs. If you do not have them, ask someone to add them for you. What else can I do to help prevent falls? Wear shoes that: Do not have high heels. Have rubber bottoms. Are comfortable and fit you well. Are closed at the toe. Do not wear sandals. If you use a stepladder: Make sure that it is fully opened. Do not climb a closed stepladder. Make sure that both sides of the stepladder are locked into place. Ask someone to  hold it for you, if possible. Clearly mark and make sure that you can see: Any grab bars or handrails. First and last steps. Where the edge of each step is. Use tools that help you move around (mobility aids) if they are needed. These include: Canes. Walkers. Scooters. Crutches. Turn on the lights when you go into a dark area. Replace any light bulbs as soon as they burn out. Set up your furniture so you have a clear path. Avoid moving your furniture around. If any of your floors are uneven, fix them. If there are any pets around you, be aware of where they are. Review your medicines with your doctor. Some medicines can make you feel dizzy. This can increase your chance of falling. Ask your doctor what other things that you can do to help prevent falls. This information is not intended to replace advice given to you by your health care provider. Make sure you discuss any questions you have with your health care provider. Document Released: 04/13/2009 Document Revised: 11/23/2015 Document Reviewed: 07/22/2014 Elsevier Interactive Patient Education  2017 [employer].

## 2021-05-21 ENCOUNTER — Other Ambulatory Visit: Payer: Self-pay | Admitting: Family

## 2021-05-25 ENCOUNTER — Encounter: Payer: Self-pay | Admitting: Family

## 2021-05-28 MED ORDER — LOSARTAN POTASSIUM-HCTZ 100-25 MG PO TABS: 1.0000 | ORAL_TABLET | Freq: Every day | ORAL | 0 refills | Status: DC

## 2021-06-04 ENCOUNTER — Ambulatory Visit (INDEPENDENT_AMBULATORY_CARE_PROVIDER_SITE_OTHER): Payer: Medicare Other | Admitting: Family

## 2021-06-04 DIAGNOSIS — E782 Mixed hyperlipidemia: Secondary | ICD-10-CM | POA: Diagnosis not present

## 2021-06-04 DIAGNOSIS — J4520 Mild intermittent asthma, uncomplicated: Secondary | ICD-10-CM

## 2021-06-04 DIAGNOSIS — R7303 Prediabetes: Secondary | ICD-10-CM | POA: Diagnosis not present

## 2021-06-04 DIAGNOSIS — I10 Essential (primary) hypertension: Secondary | ICD-10-CM | POA: Diagnosis not present

## 2021-06-04 DIAGNOSIS — N401 Enlarged prostate with lower urinary tract symptoms: Secondary | ICD-10-CM

## 2021-06-04 LAB — COMPREHENSIVE METABOLIC PANEL
ALT: 16 U/L (ref 0–53)
AST: 15 U/L (ref 0–37)
Albumin: 4.4 g/dL (ref 3.5–5.2)
Alkaline Phosphatase: 63 U/L (ref 39–117)
BUN: 17 mg/dL (ref 6–23)
CO2: 30 mEq/L (ref 19–32)
Calcium: 9.4 mg/dL (ref 8.4–10.5)
Chloride: 100 mEq/L (ref 96–112)
Creatinine, Ser: 0.87 mg/dL (ref 0.40–1.50)
GFR: 89.67 mL/min (ref >60.00)
Glucose, Bld: 115 mg/dL — ABNORMAL HIGH (ref 70–99)
Potassium: 3.8 mEq/L (ref 3.5–5.1)
Sodium: 138 mEq/L (ref 135–145)
Total Bilirubin: 0.4 mg/dL (ref 0.2–1.2)
Total Protein: 7.1 g/dL (ref 6.0–8.3)

## 2021-06-04 LAB — HEMOGLOBIN A1C: Hgb A1c MFr Bld: 6.3 % (ref 4.6–6.5)

## 2021-06-04 MED ORDER — OMEPRAZOLE 40 MG PO CPDR: 40.0000 mg | DELAYED_RELEASE_CAPSULE | Freq: Every day | ORAL | 1 refills | Status: DC

## 2021-06-04 MED ORDER — TAMSULOSIN HCL 0.4 MG PO CAPS: 0.4000 mg | ORAL_CAPSULE | Freq: Every day | ORAL | 1 refills | Status: DC

## 2021-06-04 MED ORDER — AMLODIPINE BESYLATE 10 MG PO TABS: 10.0000 mg | ORAL_TABLET | Freq: Every day | ORAL | 1 refills | Status: DC

## 2021-06-04 MED ORDER — METOPROLOL SUCCINATE ER 25 MG PO TB24: ORAL_TABLET | ORAL | 1 refills | Status: DC

## 2021-06-04 MED ORDER — BENZONATATE 100 MG PO CAPS: 100.0000 mg | ORAL_CAPSULE | Freq: Three times a day (TID) | ORAL | 0 refills | Status: DC | PRN

## 2021-06-04 MED ORDER — ALBUTEROL SULFATE HFA 108 (90 BASE) MCG/ACT IN AERS: 2.0000 | INHALATION_SPRAY | Freq: Four times a day (QID) | RESPIRATORY_TRACT | 0 refills | Status: AC | PRN

## 2021-06-04 MED ORDER — PRAVASTATIN SODIUM 40 MG PO TABS: ORAL_TABLET | ORAL | 1 refills | Status: DC

## 2021-06-04 MED ORDER — LOSARTAN POTASSIUM-HCTZ 100-25 MG PO TABS: 1.0000 | ORAL_TABLET | Freq: Every day | ORAL | 0 refills | Status: DC

## 2021-06-04 NOTE — Assessment & Plan Note (Signed)
Requests refill of albuterol. Rx provided for prn tessalon for cough. Offered covid test- he declines, states he has home tests and will complete there.

## 2021-06-04 NOTE — Progress Notes (Signed)
Subjective:   By signing my name below, I, Shehryar Baig, attest that this documentation has been prepared under the direction and in the presence of [name] NP. 06/04/2021      Patient ID: Aaron Ruiz, male    DOB: [birth year], 65 y.o.   MRN: [id number]  Chief Complaint  Patient presents with   Hypertension    Here for follow up    Hypertension  Patient is in today for a office visit.   Hip and knee pain- He complains of pain in both hips and both knees. He is taking icy hot and OTC tylenol to manage his pain and finds mild relief. He is also participating exercise daily and reports no new issues. Cough- He complains of a cough since last Thursday. He is taking children's cough medication to manage it at this time. He denies having any fever. He did not test for Covid-19 and is interested in taking one during this visit. He is requesting a refill for albuterol inhaler. He tried a puff of his sons albuterol this morning but found no relief. He has no prior history of Covid-19. Blood pressure- His blood pressure is elevated during this visit. He continues taking 100-25 mg hyzaar daily PO, 10 mg amlodipine daily PO, 25 mg metoprolol succinate daily PO and reports no new issues while taking them.   BP Readings from Last 3 Encounters:  06/04/21 (!) 147/64  02/26/21 130/72  11/07/20 (!) 146/64   Pulse Readings from Last 3 Encounters:  06/04/21 82  02/26/21 69  11/07/20 68   Blood sugar- He is managing his blood sugar through his diet at this time. He is managing a healthy diet by reducing his carbohydrate intake.  Immunizations- He is interested in receiving a pneumonia booster vaccine during this visit.   Lab Results  Component Value Date   HGBA1C 6.0 09/12/2020   Cholesterol- He continues taking 40 mg pravastatin daily PO and reports no new issues while taking it. He was taking fish oil supplements but stopped after learning blood thinners might conflict with it.    Lab Results  Component Value Date   CHOL 188 09/11/2020   HDL 40.00 09/11/2020   LDLCALC 80 12/31/2018   LDLDIRECT 116.0 09/11/2020   TRIG 378.0 (H) 09/11/2020   CHOLHDL 5 09/11/2020   Urination- He reports no new issues while taking 0.4 mg Flomax.daily PO.    Health Maintenance Due  Topic Date Due   COVID-19 Vaccine (3 - Pfizer risk series) 10/20/2019   Pneumonia Vaccine 40+ Years old (2 - PCV) 01/10/2021   Zoster Vaccines- Shingrix (2 of 2) 05/27/2021    Past Medical History:  Diagnosis Date   Actinic keratosis 03/20/2009   Qualifier: Diagnosis of  By: Shawna Orleans DO, D. 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   Colon polyps    Daytime somnolence 09/21/2020   Diverticulosis    Essential hypertension 02/06/2009   Qualifier: Diagnosis of  By: [name], [name]      Fatigue 09/21/2020   Former smoker 09/21/2020   General medical examination 11/14/2010  GERD 02/06/2009   Qualifier: Diagnosis of  By: [name], [name]     GERD (gastroesophageal reflux disease)    on meds   High cholesterol 02/06/2009   Qualifier: Diagnosis of  By: [name], [name]     Hx of adenomatous colonic polyps 03/07/2009   Hx of eye surgery 2018   "Left Eye was swollen and had procedure to remove excess swelling"   Hyperglycemia 05/01/2011   Hyperlipidemia    Hypertension    Hypogonadism male 04/30/2011   Lactose intolerance    Lentigo 10/16/2020   Mild intermittent asthma 06/30/2018   Neoplasm of uncertain behavior of skin 10/16/2020   Obesity (BMI 30-39.9) 09/21/2020   Osteoarthritis 09/24/2011   OVERWEIGHT 02/06/2009   Qualifier: Diagnosis of  By: [name]     Palpitations 09/21/2020   Paroxysmal atrial fibrillation ([place]) 10/06/2020   Rash/urticaria 08/07/2012   Recurrent UTI 07/30/2013   Routine general medical examination at a health care facility 12/16/2011   Secondary hypercoagulable state ([place]) 10/06/2020   SKIN LESION 03/20/2009   Qualifier: Diagnosis of  By: [name]    Snoring 09/21/2020   Unspecified asthma(493.90) 02/06/2009   Centricity Description: ASTHMA Qualifier: Diagnosis of  By: [name], [name]   Centricity Description: ASTHMA, INTERMITTENT, MILD Qualifier: Diagnosis of  By: [name], Aiven Kampe S    URI (upper respiratory infection) 07/23/2011   Vitamin D deficiency 09/21/2020    Past Surgical History:  Procedure Laterality Date   CYSTECTOMY  05/2004   removed fromback /sebaceous cyst   INGUINAL HERNIA [place]   left   mohs procedure  2017   left side of nose   NASAL SEPTUM SURGERY      Family History  Problem Relation Age of Onset   Coronary artery disease Father        CABG X5   Diabetes Father    Colon cancer Maternal Aunt    Cancer Brother        lung (smoker)   Hypertension Other    Kidney disease Other     Social History   Socioeconomic History   Marital status: Married    Spouse name: Not on file   Number of children: Not on file   Years of education: Not on file   Highest education level: Not on file  Occupational History   Not on file  Tobacco Use   Smoking status: Former    Types: Cigarettes   Smokeless tobacco: Never  Vaping Use   Vaping Use: Never used  Substance and Sexual Activity   Alcohol use: No    Alcohol/week: 0.0 standard drinks   Drug use: No   Sexual activity: Not on file  Other Topics Concern   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[employer])   Married 53 years    1  daughter 15 - [name] (lives in [place])   1  son 39    Retired   Alcohol use-no   Smoking Status:  quit   Caffeine use/day:  2-3 beverages daily   Social  Determinants of Radio broadcast assistant Strain: Low Risk    Difficulty of Paying Living Expenses: Not hard at all  Food Insecurity: No Food Insecurity   Worried About Charity fundraiser in the Last Year: Never true   Arboriculturist in the Last Year: Never true  Transportation Needs: No Transportation Needs  Lack of Transportation (Medical): No   Lack of Transportation (Non-Medical): No  Physical Activity: Sufficiently Active   Days of Exercise per Week: 5 days   Minutes of Exercise per Session: 30 min  Stress: No Stress Concern Present   Feeling of Stress : Not at all  Social Connections: Moderately Integrated   Frequency of Communication with Friends and Family: More than three times a week   Frequency of Social Gatherings with Friends and Family: More than three times a week   Attends Religious Services: More than 4 times per year   Active Member of [employer] or Organizations: No   Attends Archivist Meetings: Never   Marital Status: Married  Human resources officer Violence: Not At Risk   Fear of Current or Ex-Partner: No   Emotionally Abused: No   Physically Abused: No   Sexually Abused: No    Outpatient Medications Prior to Visit  Medication Sig Dispense Refill   acetaminophen (TYLENOL) 500 MG tablet Take 1 tablet (500 mg total) by mouth every 6 (six) hours as needed. 30 tablet 5   cetirizine (ZYRTEC) 10 MG tablet Take 10 mg by mouth daily.     flecainide (TAMBOCOR) 50 MG tablet Take 1 tablet (50 mg total) by mouth 2 (two) times daily. 180 tablet 2   guaiFENesin (MUCINEX) 600 MG 12 hr tablet Take 600 mg by mouth 2 (two) times daily as needed.     Omega-3 Fatty Acids (FISH OIL) 1200 MG CAPS Take by mouth.     Vitamin D, Ergocalciferol, (DRISDOL) 1.25 MG (50000 UNIT) CAPS capsule Take 1 capsule (50,000 Units total) by mouth every 7 (seven) days. 12 capsule 0   Zoster Vaccine Adjuvanted [facility]) injection Inject 0.28m IM now and again in 2-6 months. 0.5 mL 1    amLODipine (NORVASC) 10 MG tablet TAKE 1 TABLET(10 MG) BY MOUTH DAILY 30 tablet 0   losartan-hydrochlorothiazide (HYZAAR) 100-25 MG tablet Take 1 tablet by mouth daily. 90 tablet 0   metoprolol succinate (TOPROL-XL) 25 MG 24 hr tablet TAKE 1 TABLET(25 MG) BY MOUTH DAILY 30 tablet 0   omeprazole (PRILOSEC) 40 MG capsule TAKE 1 CAPSULE(40 MG) BY MOUTH DAILY 90 capsule 1   pravastatin (PRAVACHOL) 40 MG tablet TAKE 1 TABLET(40 MG) BY MOUTH DAILY 90 tablet 1   tamsulosin (FLOMAX) 0.4 MG CAPS capsule TAKE 1 CAPSULE(0.4 MG) BY MOUTH DAILY 90 capsule 1   apixaban (ELIQUIS) 5 MG TABS tablet Take 1 tablet (5 mg total) by mouth 2 (two) times daily. 60 tablet 5   No facility-administered medications prior to visit.    Allergies  Allergen Reactions   Sulfonamide Derivatives     REACTION: Hallucination    Review of Systems  Respiratory:  Positive for cough.   Musculoskeletal:        (+)bilateral hip pain (+)bilateral knee pain      Objective:    Physical Exam Constitutional:      General: He is not in acute distress.    Appearance: Normal appearance. He is not ill-appearing.  HENT:     Head: Normocephalic and atraumatic.     Right Ear: External ear normal.     Left Ear: External ear normal.  Eyes:     Extraocular Movements: Extraocular movements intact.     Pupils: Pupils are equal, round, and reactive to light.  Cardiovascular:     Rate and Rhythm: Normal rate and regular rhythm.     Heart sounds: Normal heart sounds. No murmur heard.  No gallop.     Comments: Blood pressure measured 144/68 during recheck Pulmonary:     Effort: Pulmonary effort is normal. No respiratory distress.     Breath sounds: Normal breath sounds. No wheezing or rales.  Skin:    General: Skin is warm and dry.  Neurological:     Mental Status: He is alert and oriented to person, place, and time.  Psychiatric:        Behavior: Behavior normal.    BP (!) 147/64 (BP Location: Right Arm, Patient Position:  Sitting, Cuff Size: Large)   Pulse 82   Temp 98.6 F (37 C) (Oral)   Resp 16   Wt 201 lb (91.2 kg)   SpO2 98%   BMI 31.48 kg/m  Wt Readings from Last 3 Encounters:  06/04/21 201 lb (91.2 kg)  05/18/21 201 lb (91.2 kg)  02/26/21 200 lb (90.7 kg)       Assessment & Plan:   Problem List Items Addressed This Visit       Unprioritized   Mild intermittent asthma    Requests refill of albuterol. Rx provided for prn tessalon for cough. Offered covid test- he declines, states he has home tests and will complete there.       Relevant Medications   albuterol (VENTOLIN HFA) 108 (90 Base) MCG/ACT inhaler   Hyperlipidemia    Lab Results  Component Value Date   CHOL 188 09/11/2020   HDL 40.00 09/11/2020   LDLCALC 80 12/31/2018   LDLDIRECT 116.0 09/11/2020   TRIG 378.0 (H) 09/11/2020   CHOLHDL 5 09/11/2020  LDL at goal. Continue pravachol.       Relevant Medications   amLODipine (NORVASC) 10 MG tablet   losartan-hydrochlorothiazide (HYZAAR) 100-25 MG tablet   metoprolol succinate (TOPROL-XL) 25 MG 24 hr tablet   pravastatin (PRAVACHOL) 40 MG tablet   Essential hypertension    BP Readings from Last 3 Encounters:  06/04/21 (!) 147/64  02/26/21 130/72  11/07/20 (!) 146/64  Fair BP control.        Relevant Medications   amLODipine (NORVASC) 10 MG tablet   losartan-hydrochlorothiazide (HYZAAR) 100-25 MG tablet   metoprolol succinate (TOPROL-XL) 25 MG 24 hr tablet   pravastatin (PRAVACHOL) 40 MG tablet   BPH (benign prostatic hyperplasia)    Voiding without difficulty. Monitor on flomax.       Relevant Medications   tamsulosin (FLOMAX) 0.4 MG CAPS capsule   Borderline diabetes    Lab Results  Component Value Date   HGBA1C 6.0 09/12/2020   HGBA1C 5.8 01/11/2020   HGBA1C 5.7 12/31/2018   Lab Results  Component Value Date   MICROALBUR 0.50 01/05/2014   LDLCALC 80 12/31/2018   CREATININE 0.92 10/03/2020  Discussed diet.  Check A1C.  Wt Readings from Last 3  Encounters:  06/04/21 201 lb (91.2 kg)  05/18/21 201 lb (91.2 kg)  02/26/21 200 lb (90.7 kg)        Relevant Orders   Hemoglobin A1c   Comp Met (CMET)     Meds ordered this encounter  Medications   benzonatate (TESSALON) 100 MG capsule    Sig: Take 1 capsule (100 mg total) by mouth 3 (three) times daily as needed.    Dispense:  20 capsule    Refill:  0    Order Specific Question:   Supervising Provider    Answer:   Penni Homans A [[id number]]   albuterol (VENTOLIN HFA) 108 (90 Base) MCG/ACT inhaler    Sig: Inhale  2 puffs into the lungs every 6 (six) hours as needed for wheezing or shortness of breath.    Dispense:  8 g    Refill:  0    Order Specific Question:   Supervising Provider    Answer:   Penni Homans A [[id number]]   amLODipine (NORVASC) 10 MG tablet    Sig: Take 1 tablet (10 mg total) by mouth daily.    Dispense:  90 tablet    Refill:  1    Order Specific Question:   Supervising Provider    Answer:   Penni Homans A [[id number]]   losartan-hydrochlorothiazide (HYZAAR) 100-25 MG tablet    Sig: Take 1 tablet by mouth daily.    Dispense:  90 tablet    Refill:  0    Order Specific Question:   Supervising Provider    Answer:   Penni Homans A [[id number]]   metoprolol succinate (TOPROL-XL) 25 MG 24 hr tablet    Sig: TAKE 1 TABLET(25 MG) BY MOUTH DAILY    Dispense:  90 tablet    Refill:  1    Needs follow up ov    Order Specific Question:   Supervising Provider    Answer:   Penni Homans A [[id number]]   omeprazole (PRILOSEC) 40 MG capsule    Sig: Take 1 capsule (40 mg total) by mouth daily.    Dispense:  90 capsule    Refill:  1    Order Specific Question:   Supervising Provider    Answer:   Penni Homans A [[id number]]   pravastatin (PRAVACHOL) 40 MG tablet    Sig: TAKE 1 TABLET(40 MG) BY MOUTH DAILY    Dispense:  90 tablet    Refill:  1    Order Specific Question:   Supervising Provider    Answer:   Penni Homans A [[id number]]   tamsulosin (FLOMAX) 0.4 MG CAPS capsule    Sig: Take 1 capsule  (0.4 mg total) by mouth daily.    Dispense:  90 capsule    Refill:  1    Order Specific Question:   Supervising Provider    Answer:   Penni Homans A [[id number]]    I, [name] NP, personally preformed the services described in this documentation.  All medical record entries made by the scribe were at my direction and in my presence.  I have reviewed the chart and discharge instructions (if applicable) and agree that the record reflects my personal performance and is accurate and complete. 06/04/2021   I,Shehryar Baig,acting as a Education administrator for Nance Pear, NP.,have documented all relevant documentation on the behalf of Nance Pear, NP,as directed by  Nance Pear, NP while in the presence of Nance Pear, NP.    Nance Pear, NP

## 2021-06-04 NOTE — Assessment & Plan Note (Signed)
Lab Results  Component Value Date   HGBA1C 6.0 09/12/2020   HGBA1C 5.8 01/11/2020   HGBA1C 5.7 12/31/2018   Lab Results  Component Value Date   MICROALBUR 0.50 01/05/2014   LDLCALC 80 12/31/2018   CREATININE 0.92 10/03/2020   Discussed diet.  Check A1C.  Wt Readings from Last 3 Encounters:  06/04/21 201 lb (91.2 kg)  05/18/21 201 lb (91.2 kg)  02/26/21 200 lb (90.7 kg)

## 2021-06-04 NOTE — Patient Instructions (Signed)
Please complete lab work prior to leaving.   

## 2021-06-04 NOTE — Assessment & Plan Note (Signed)
Voiding without difficulty. Monitor on flomax.

## 2021-06-04 NOTE — Assessment & Plan Note (Signed)
Lab Results  Component Value Date   CHOL 188 09/11/2020   HDL 40.00 09/11/2020   LDLCALC 80 12/31/2018   LDLDIRECT 116.0 09/11/2020   TRIG 378.0 (H) 09/11/2020   CHOLHDL 5 09/11/2020   LDL at goal. Continue pravachol.

## 2021-06-04 NOTE — Assessment & Plan Note (Addendum)
BP Readings from Last 3 Encounters:  06/04/21 (!) 147/64  02/26/21 130/72  11/07/20 (!) 146/64   Fair BP control.

## 2021-06-11 ENCOUNTER — Other Ambulatory Visit: Payer: Self-pay | Admitting: Family

## 2021-07-10 DIAGNOSIS — M25562 Pain in left knee: Secondary | ICD-10-CM | POA: Diagnosis not present

## 2021-08-07 DIAGNOSIS — M25552 Pain in left hip: Secondary | ICD-10-CM | POA: Diagnosis not present

## 2021-08-14 DIAGNOSIS — M1612 Unilateral primary osteoarthritis, left hip: Secondary | ICD-10-CM | POA: Diagnosis not present

## 2021-08-26 ENCOUNTER — Other Ambulatory Visit: Payer: Self-pay | Admitting: Cardiology

## 2021-08-26 DIAGNOSIS — I480 Paroxysmal atrial fibrillation: Secondary | ICD-10-CM

## 2021-08-27 ENCOUNTER — Ambulatory Visit: Payer: Medicare Other | Admitting: Cardiology

## 2021-08-27 ENCOUNTER — Other Ambulatory Visit: Payer: Self-pay | Admitting: Family

## 2021-08-27 NOTE — Telephone Encounter (Signed)
Prescription refill request for Eliquis received. Indication: Afib  Last office visit: 02/26/21 ([place])  Scr: 0.87 (06/04/21)  Age: 66 Weight: 91.2kg  Appropriate dose and refill sent to requested pharmacy.

## 2021-09-15 DIAGNOSIS — H5203 Hypermetropia, bilateral: Secondary | ICD-10-CM | POA: Diagnosis not present

## 2021-10-15 ENCOUNTER — Ambulatory Visit: Payer: Medicare Other | Admitting: Cardiology

## 2021-10-22 ENCOUNTER — Ambulatory Visit: Payer: Medicare Other | Admitting: Cardiology

## 2021-10-22 ENCOUNTER — Encounter: Payer: Self-pay | Admitting: Cardiology

## 2021-10-22 VITALS — BP 122/66 | HR 70 | Ht 67.0 in | Wt 202.0 lb

## 2021-10-22 DIAGNOSIS — D6869 Other thrombophilia: Secondary | ICD-10-CM

## 2021-10-22 DIAGNOSIS — I480 Paroxysmal atrial fibrillation: Secondary | ICD-10-CM

## 2021-10-22 NOTE — Progress Notes (Signed)
? ?Electrophysiology Office Note ? ? ?Date:  10/22/2021  ? ?ID:  [name], DOB July 18, 1955, MRN [id number] ? ?PCP:  [name], NP  ?Cardiologist:  [name] ?Primary Electrophysiologist:  Aaron Cortese Meredith Leeds, MD   ? ?Chief Complaint: AF ?  ?History of Present Illness: ?Aaron Ruiz is a 66 y.o. male who is being seen today for the evaluation of AF at the request of [name], NP. Presenting today for electrophysiology evaluation. ? ?He has a history significant for hypertension, hyperlipidemia, atrial fibrillation.  He was initially diagnosed with atrial fibrillation May 2022 when he went to [facility] with palpitations.  He was started on Eliquis.  He wore a cardiac monitor that showed more episodes of atrial fibrillation with symptoms of weakness and fatigue.  He was started on flecainide. ? ?Today, denies symptoms of palpitations, chest pain, shortness of breath, orthopnea, PND, lower extremity edema, claudication, dizziness, presyncope, syncope, bleeding, or neurologic sequela. The patient is tolerating medications without difficulties.  Since being seen he has done well.  He has had no chest pain or shortness of breath.  He is able do all of his daily activities.  He is noted no further episodes of atrial fibrillation.  He was hesitant on ablation at his last visit he is he was having to take care of his granddaughter.  She is now going to full-time school and he would potentially be interested.  He Aaron Ruiz call us back if he wishes to have an ablation. ? ? ?Past Medical History:  ?Diagnosis Date  ? Actinic keratosis 03/20/2009  ? Qualifier: Diagnosis of  By: [name]   ? Allergic conjunctivitis 06/30/2018  ? Anemia   ? ANEMIA-NOS 02/06/2009  ? Qualifier: Diagnosis of  By: [name], [name]    ? Asthma   ? uses inhaler  ? Basal cell carcinoma 2017  ? left side of nose  ? Basal cell carcinoma of nose 10/16/2020  ? Borderline diabetes 05/03/2011  ? BPH (benign prostatic  hyperplasia) 07/29/2014  ? Chemosis of conjunctiva of both eyes 01/15/2017  ? Colon polyps   ? Daytime somnolence 09/21/2020  ? Diverticulosis   ? Essential hypertension 02/06/2009  ? Qualifier: Diagnosis of  By: [name], [name]     ? Fatigue 09/21/2020  ? Former smoker 09/21/2020  ? General medical examination 11/14/2010  ? GERD 02/06/2009  ? Qualifier: Diagnosis of  By: [name], [name]    ? GERD (gastroesophageal reflux disease)   ? on meds  ? High cholesterol 02/06/2009  ? Qualifier: Diagnosis of  By: [name], [name]    ? Hx of adenomatous colonic polyps 03/07/2009  ? Hx of eye surgery 2018  ? "Left Eye was swollen and had procedure to remove excess swelling"  ? Hyperglycemia 05/01/2011  ? Hyperlipidemia   ? Hypertension   ? Hypogonadism male 04/30/2011  ? Lactose intolerance   ? Lentigo 10/16/2020  ? Mild intermittent asthma 06/30/2018  ? Neoplasm of uncertain behavior of skin 10/16/2020  ? Obesity (BMI 30-39.9) 09/21/2020  ? Osteoarthritis 09/24/2011  ? OVERWEIGHT 02/06/2009  ? Qualifier: Diagnosis of  By: [name]   ? Palpitations 09/21/2020  ? Paroxysmal atrial fibrillation ([place]) 10/06/2020  ? Rash/urticaria 08/07/2012  ? Recurrent UTI 07/30/2013  ? Routine general medical examination at a health care facility 12/16/2011  ? Secondary hypercoagulable state ([place]) 10/06/2020  ? SKIN LESION 03/20/2009  ? Qualifier: Diagnosis of  By: [name]   ?  Snoring 09/21/2020  ? Unspecified asthma(493.90) 02/06/2009  ? Centricity Description: ASTHMA Qualifier: Diagnosis of  By: [name], [name]   Centricity Description: ASTHMA, INTERMITTENT, MILD Qualifier: Diagnosis of  By: [name], [name] S   ? URI (upper respiratory infection) 07/23/2011  ? Vitamin D deficiency 09/21/2020  ? ?Past Surgical History:  ?Procedure Laterality Date  ? CYSTECTOMY  05/2004  ? removed fromback /sebaceous cyst  ? [place]  ? left  ? mohs procedure  2017  ? left side of nose  ? NASAL SEPTUM SURGERY    ? ? ? ?Current  Outpatient Medications  ?Medication Sig Dispense Refill  ? acetaminophen (TYLENOL) 500 MG tablet Take 1 tablet (500 mg total) by mouth every 6 (six) hours as needed. 30 tablet 5  ? albuterol (VENTOLIN HFA) 108 (90 Base) MCG/ACT inhaler Inhale 2 puffs into the lungs every 6 (six) hours as needed for wheezing or shortness of breath. 8 g 0  ? amLODipine (NORVASC) 10 MG tablet Take 1 tablet (10 mg total) by mouth daily. 90 tablet 1  ? cetirizine (ZYRTEC) 10 MG tablet Take 10 mg by mouth daily.    ? ELIQUIS 5 MG TABS tablet TAKE 1 TABLET(5 MG) BY MOUTH TWICE DAILY 60 tablet 5  ? flecainide (TAMBOCOR) 50 MG tablet Take 1 tablet (50 mg total) by mouth 2 (two) times daily. 180 tablet 2  ? guaiFENesin (MUCINEX) 600 MG 12 hr tablet Take 600 mg by mouth 2 (two) times daily as needed.    ? losartan-hydrochlorothiazide (HYZAAR) 100-25 MG tablet TAKE 1 TABLET BY MOUTH DAILY 90 tablet 1  ? metoprolol succinate (TOPROL-XL) 25 MG 24 hr tablet TAKE 1 TABLET(25 MG) BY MOUTH DAILY 90 tablet 1  ? Omega-3 Fatty Acids (FISH OIL) 1200 MG CAPS Take by mouth.    ? omeprazole (PRILOSEC) 40 MG capsule TAKE 1 CAPSULE(40 MG) BY MOUTH DAILY 90 capsule 1  ? pravastatin (PRAVACHOL) 40 MG tablet TAKE 1 TABLET(40 MG) BY MOUTH DAILY 90 tablet 1  ? tamsulosin (FLOMAX) 0.4 MG CAPS capsule Take 1 capsule (0.4 mg total) by mouth daily. 90 capsule 1  ? VITAMIN D PO Take 1 capsule by mouth daily.    ? ?No current facility-administered medications for this visit.  ? ? ?Allergies:   Sulfonamide derivatives  ? ?Social History:  The patient  reports that he has quit smoking. His smoking use included cigarettes. He has never used smokeless tobacco. He reports that he does not drink alcohol and does not use drugs.  ? ?Family History:  The patient's family history includes Cancer in his brother; Colon cancer in his maternal aunt; Coronary artery disease in his father; Diabetes in his father; Hypertension in an other family member; Kidney disease in an other family  member.  ? ?ROS:  Please see the history of present illness.   Otherwise, review of systems is positive for none.   All other systems are reviewed and negative.  ? ?PHYSICAL EXAM: ?VS:  BP 122/66   Pulse 70   Ht '5\' 7"'$  (1.702 m)   Wt 202 lb (91.6 kg)   SpO2 95%   BMI 31.64 kg/m?  , BMI Body mass index is 31.64 kg/m?. ?GEN: Well nourished, well developed, in no acute distress  ?HEENT: normal  ?Neck: no JVD, carotid bruits, or masses ?Cardiac: RRR; no murmurs, rubs, or gallops,no edema  ?Respiratory:  clear to auscultation bilaterally, normal work of breathing ?GI: soft, nontender, nondistended, + BS ?MS:  no deformity or atrophy  ?Skin: warm and dry ?Neuro:  Strength and sensation are intact ?Psych: euthymic mood, full affect ? ?EKG:  EKG is ordered today. ?Personal review of the ekg ordered shows sinus rhythm, rate 70 ? ?Recent Labs: ?06/04/2021: ALT 16; BUN 17; Creatinine, Ser 0.87; Potassium 3.8; Sodium 138  ? ? ?Lipid Panel  ?   ?Component Value Date/Time  ? CHOL 188 09/11/2020 1232  ? TRIG 378.0 (H) 09/11/2020 1232  ? HDL 40.00 09/11/2020 1232  ? CHOLHDL 5 09/11/2020 1232  ? VLDL 75.6 (H) 09/11/2020 1232  ? [place] 80 12/31/2018 0901  ? LDLDIRECT 116.0 09/11/2020 1232  ? ? ? ?Wt Readings from Last 3 Encounters:  ?10/22/21 202 lb (91.6 kg)  ?06/04/21 201 lb (91.2 kg)  ?05/18/21 201 lb (91.2 kg)  ?  ? ? ?Other studies Reviewed: ?Additional studies/ records that were reviewed today include: TTE 11/01/20  ?Review of the above records today demonstrates:  ? 1. Left ventricular ejection fraction, by estimation, is 60 to 65%. The  ?left ventricle has normal function. The left ventricle has no regional  ?wall motion abnormalities. Left ventricular diastolic parameters were  ?normal.  ? 2. Right ventricular systolic function is normal. The right ventricular  ?size is normal.  ? 3. The mitral valve is normal in structure. Trivial mitral valve  ?regurgitation. No evidence of mitral stenosis.  ? 4. The aortic valve is  normal in it this point, he is not quite ready for ablation structure. Aortic valve regurgitation is  ?not visualized. No aortic stenosis is present.  ? 5. The inferior vena cava is normal in size with greater than 50%

## 2021-10-22 NOTE — Patient Instructions (Signed)
Medication Instructions:  ?Your physician recommends that you continue on your current medications as directed. Please refer to the Current Medication list given to you today. ? ?*If you need a refill on your cardiac medications before your next appointment, please call your pharmacy* ? ? ?Lab Work: ?None ordered ? ? ?Testing/Procedures: ?None ordered ? ? ?Follow-Up: ?At [facility], you and your health needs are our priority.  As part of our continuing mission to provide you with exceptional heart care, we have created designated Provider Care Teams.  These Care Teams include your primary Cardiologist (physician) and Advanced Practice Providers (APPs -  Physician Assistants and Nurse Practitioners) who all work together to provide you with the care you need, when you need it. ? ?Your next appointment:   ?6 month(s) ? ?The format for your next appointment:   ?In Person ? ?Provider:   ?Allegra Lai, MD ? ? ? ?Thank you for choosing [facility]!! ? ? ?Trinidad Curet, RN ?([phone] ?  ?

## 2021-10-24 ENCOUNTER — Encounter: Payer: Self-pay | Admitting: Cardiology

## 2021-10-30 ENCOUNTER — Encounter: Payer: Self-pay | Admitting: Cardiology

## 2021-10-30 NOTE — Telephone Encounter (Signed)
Called pt to apologize that 7/11 was a typo and 8/11 is what is was supposed to say. ?Aware that he requested week of 8/7 and date of 8/11 is what [place] has open. ?Pt will take that date and aware I will be in touch with instructions. ?Patient verbalized understanding and agreeable to plan.  ? ?

## 2021-11-07 IMAGING — DX DG CHEST 2V
2 series · 2 of 2 positions shown · non-contrast
Comparison: None.

CLINICAL DATA: Atrial fibrillation

EXAM:
CHEST - 2 VIEW

[chest pa]
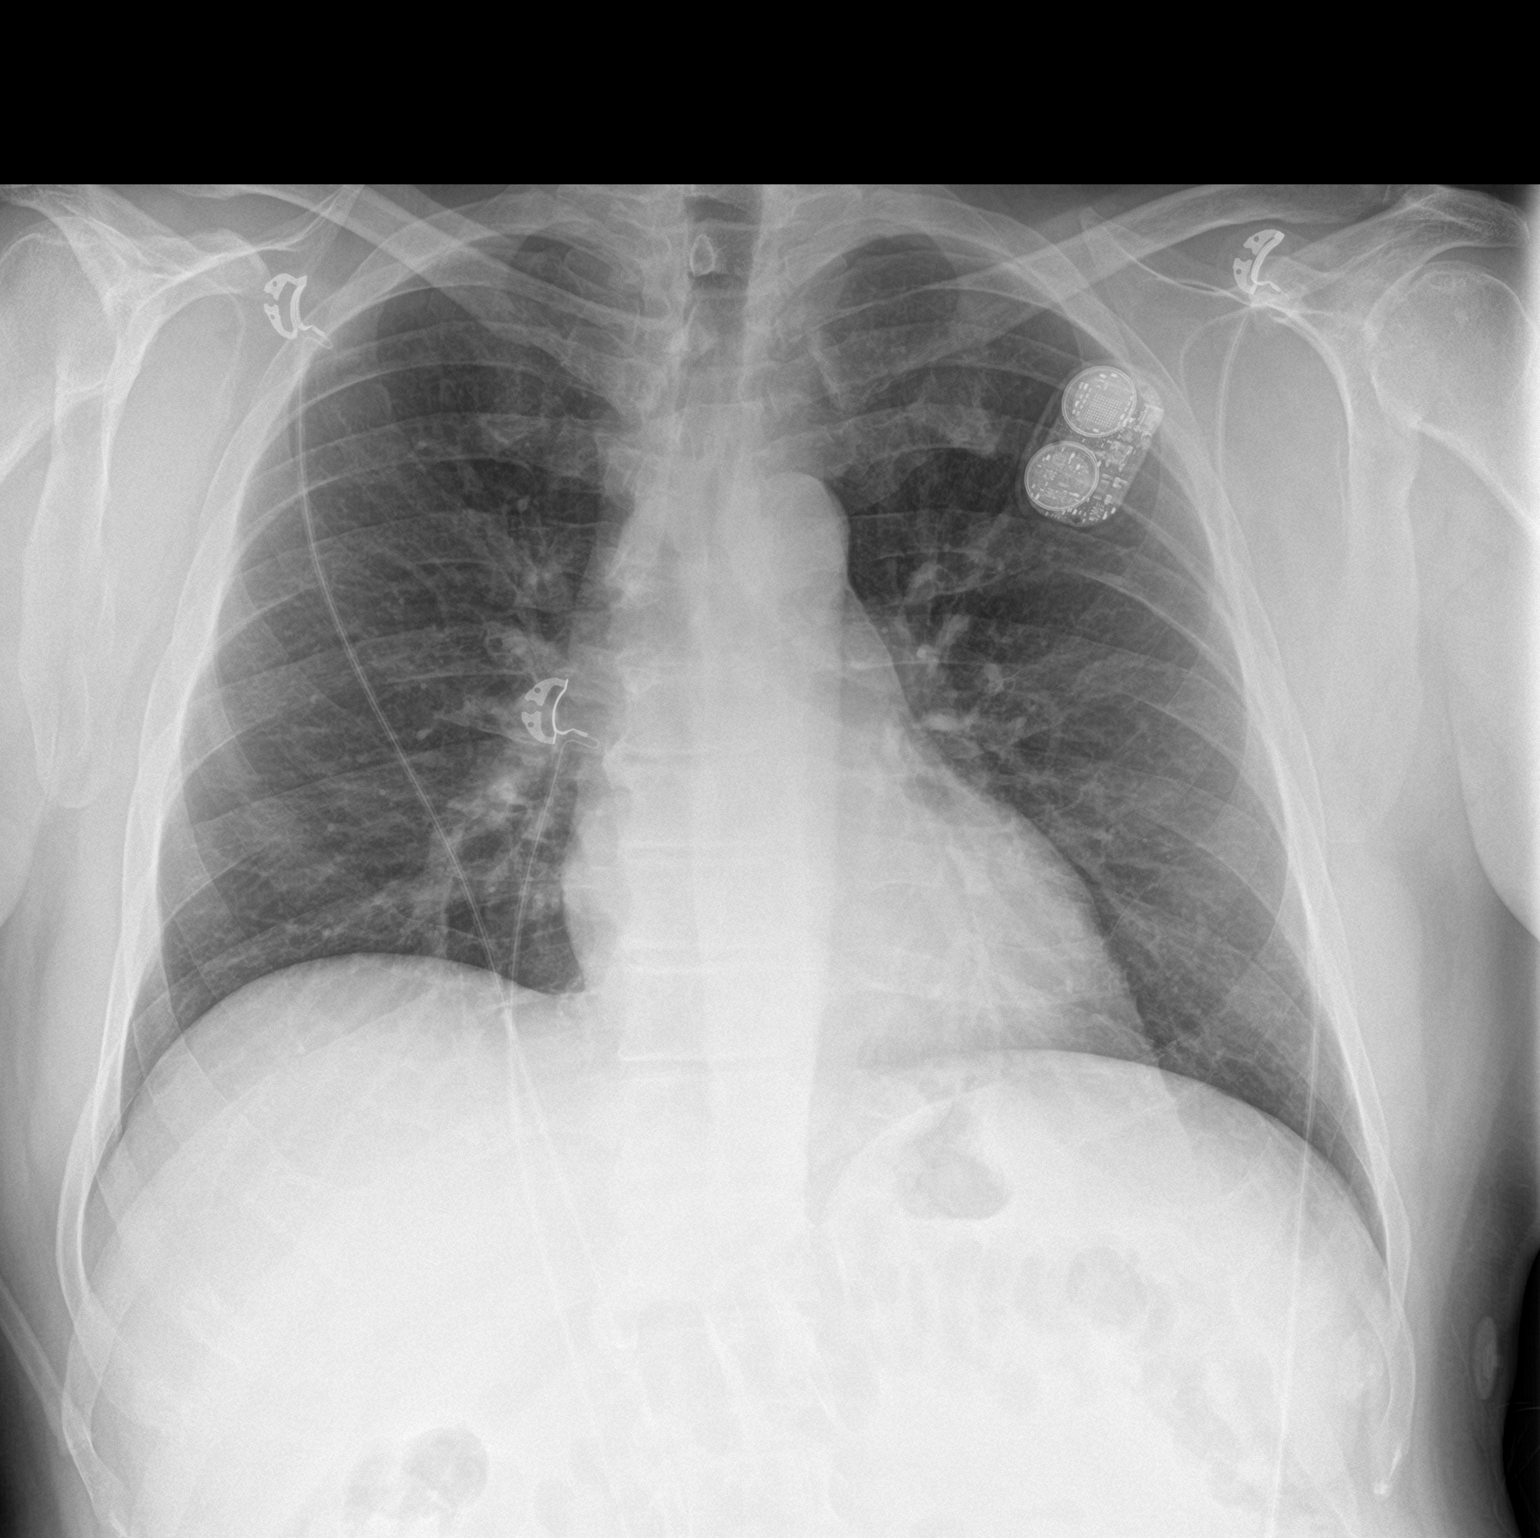

[chest lat]
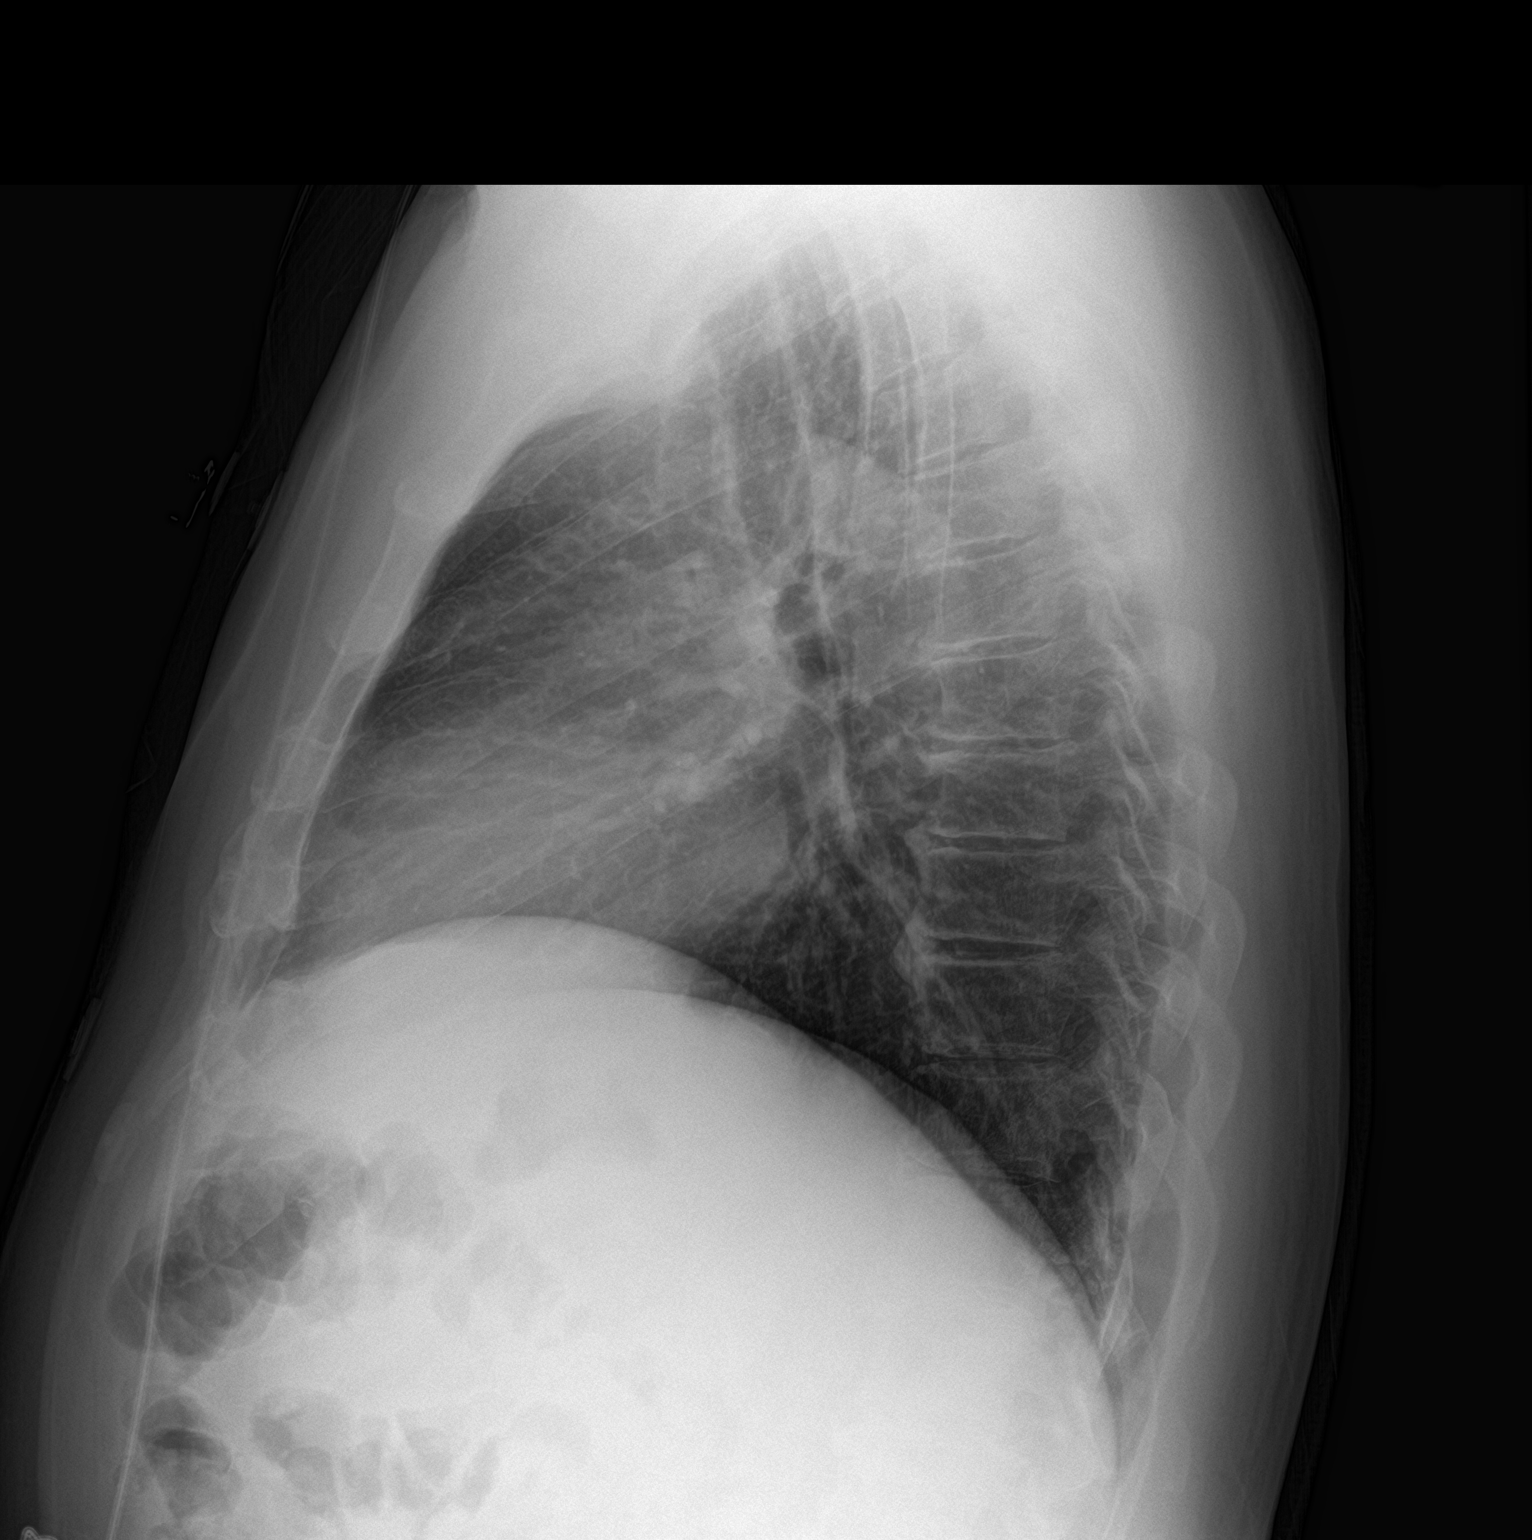

[2 of 2 positions shown; findings below may reference images not displayed]

FINDINGS: Heart and mediastinal contours are within normal limits. No focal
opacities or effusions. No acute bony abnormality.
IMPRESSION: No active cardiopulmonary disease.

## 2021-11-27 ENCOUNTER — Other Ambulatory Visit: Payer: Self-pay | Admitting: Cardiology

## 2021-12-10 ENCOUNTER — Encounter: Payer: Self-pay | Admitting: Family

## 2021-12-10 ENCOUNTER — Ambulatory Visit (INDEPENDENT_AMBULATORY_CARE_PROVIDER_SITE_OTHER): Payer: Medicare Other | Admitting: Family

## 2021-12-10 VITALS — BP 129/58 | HR 65 | Temp 98.5°F | Resp 16 | Ht 67.0 in | Wt 200.0 lb

## 2021-12-10 DIAGNOSIS — N401 Enlarged prostate with lower urinary tract symptoms: Secondary | ICD-10-CM

## 2021-12-10 DIAGNOSIS — R7303 Prediabetes: Secondary | ICD-10-CM

## 2021-12-10 DIAGNOSIS — I10 Essential (primary) hypertension: Secondary | ICD-10-CM | POA: Diagnosis not present

## 2021-12-10 DIAGNOSIS — E782 Mixed hyperlipidemia: Secondary | ICD-10-CM | POA: Diagnosis not present

## 2021-12-10 DIAGNOSIS — Z0000 Encounter for general adult medical examination without abnormal findings: Secondary | ICD-10-CM | POA: Diagnosis not present

## 2021-12-10 DIAGNOSIS — Z23 Encounter for immunization: Secondary | ICD-10-CM | POA: Diagnosis not present

## 2021-12-10 DIAGNOSIS — K219 Gastro-esophageal reflux disease without esophagitis: Secondary | ICD-10-CM

## 2021-12-10 DIAGNOSIS — L989 Disorder of the skin and subcutaneous tissue, unspecified: Secondary | ICD-10-CM

## 2021-12-10 MED ORDER — METOPROLOL SUCCINATE ER 25 MG PO TB24
ORAL_TABLET | ORAL | 1 refills | Status: DC
Start: 2021-12-10 — End: 2021-12-18

## 2021-12-10 MED ORDER — SILDENAFIL CITRATE 50 MG PO TABS: ORAL_TABLET | ORAL | 1 refills | Status: DC

## 2021-12-10 MED ORDER — AMLODIPINE BESYLATE 10 MG PO TABS: 10.0000 mg | ORAL_TABLET | Freq: Every day | ORAL | 1 refills | Status: DC

## 2021-12-10 MED ORDER — PRAVASTATIN SODIUM 40 MG PO TABS
ORAL_TABLET | ORAL | 1 refills | Status: DC
Start: 2021-12-10 — End: 2022-07-06

## 2021-12-10 MED ORDER — OMEPRAZOLE 40 MG PO CPDR: DELAYED_RELEASE_CAPSULE | ORAL | 1 refills | Status: DC

## 2021-12-10 MED ORDER — LOSARTAN POTASSIUM-HCTZ 100-25 MG PO TABS
1.0000 | ORAL_TABLET | Freq: Every day | ORAL | 1 refills | Status: DC
Start: 2021-12-10 — End: 2022-05-30

## 2021-12-10 MED ORDER — TAMSULOSIN HCL 0.4 MG PO CAPS
0.4000 mg | ORAL_CAPSULE | Freq: Every day | ORAL | 1 refills | Status: DC
Start: 2021-12-10 — End: 2022-05-30

## 2021-12-10 NOTE — Assessment & Plan Note (Signed)
Stable on omeprazole 40mg. Continue same.  

## 2021-12-10 NOTE — Progress Notes (Addendum)
Subjective:   By signing my name below, I, Shehryar Baig, attest that this documentation has been prepared under the direction and in the presence of [name], NP 12/10/2021    Patient ID: Aaron Ruiz, male    DOB: 02-24-1956, 66 y.o.   MRN: [id number]  Chief Complaint  Patient presents with  . Annual Exam    HPI Patient is in today for a comprehensive physical exam.    Erectile dysfunction- He complains of erectile dysfunction. He is interested in medication to manage his symptoms.  Skin lesion- He reports having a discolored skin lesion on the dorsal region of his left hand.  Flomax- He continues taking 0.4 mg Flomax daily PO and reports urinating well while taking it. He can go through most of the night without waking up to urinate.  Blood sugar- His last a1c was at goal but was slightly elevated than his prior measurement.  Lab Results  Component Value Date   HGBA1C 6.3 06/04/2021   Blood pressure- His blood pressure is doing well. He continues taking 10 mg amlodipine daily PO, 100-25 mg losartan-hydrochlorothiazide daily PO and reports no new issues while taking it.  BP Readings from Last 3 Encounters:  12/10/21 (!) 129/58  10/22/21 122/66  06/04/21 (!) 147/64   Pulse Readings from Last 3 Encounters:  12/10/21 65  10/22/21 70  06/04/21 82   Cholesterol- He continues taking 40 mg pravastatin daily PO and reports no new issues while taking it.  Lab Results  Component Value Date   CHOL 188 09/11/2020   HDL 40.00 09/11/2020   LDLCALC 80 12/31/2018   LDLDIRECT 116.0 09/11/2020   TRIG 378.0 (H) 09/11/2020   CHOLHDL 5 09/11/2020   Omeprazole- He continues taking omeprazole and reports his reflux is controlled while taking it.  Joint pain- He reports injuring his knee recently and seen a sports medicine specialist. He was given an injection and but on crutches for 2 weeks. He also had pain in his left hip and was given a cortisone injection as well.   Albuterol- He has no used his albuterol inhaler in the past couple of years but wants to have it incase his breathing worsens.  Social history: He denies unexpected weight gain, adenopathy, Fever, New moles, congestion, sinus pain, sore throat, chest pain, palpitaitons, leg swelling, shortness of breath, wheezing, cough Nausea, vomiting, diarrhea, consitpation, blood in stool, dysuria, hematuria, new joint pain, new muscle pain, frequent headaches, dizziness, depression, or anxiety.  Immunizations: He last received a pneumonia vaccine in 2021. He is due for pneumonia booster vaccine. He is UTD on shingles vaccines. He reports feeling ill and having a fever after receiving both the shingles and flu vaccine on the same day. He has 2 pfizer Covid-19 vaccines. He prefers not received the new bivalent booster vaccine.  Colonoscopy: Last completed 10/27/2014. Results showed: 1. Moderate diverticulosis was noted in the sigmoid colon 2. The examination was otherwise normal - hx 2 adenomas < 1 cm 2010 Otherwise results are normal. Repeat in 10 years.    Health Maintenance Due  Topic Date Due  . Pneumonia Vaccine 55+ Years old (2 - PCV) 01/10/2021    Past Medical History:  Diagnosis Date  . Actinic keratosis 03/20/2009   Qualifier: Diagnosis of  By: [name]   . Allergic conjunctivitis 06/30/2018  . Anemia   . ANEMIA-NOS 02/06/2009   Qualifier: Diagnosis of  By: [name], [name]    . Asthma  uses inhaler  . Basal cell carcinoma 2017   left side of nose  . Basal cell carcinoma of nose 10/16/2020  . Borderline diabetes 05/03/2011  . BPH (benign prostatic hyperplasia) 07/29/2014  . Chemosis of conjunctiva of both eyes 01/15/2017  . Colon polyps   . Daytime somnolence 09/21/2020  . Diverticulosis   . Essential hypertension 02/06/2009   Qualifier: Diagnosis of  By: [name], [name]     . Fatigue 09/21/2020  . Former smoker 09/21/2020  . General medical examination 11/14/2010  . GERD  02/06/2009   Qualifier: Diagnosis of  By: [name], [name]    . GERD (gastroesophageal reflux disease)    on meds  . High cholesterol 02/06/2009   Qualifier: Diagnosis of  By: [name], [name]    . Hx of adenomatous colonic polyps 03/07/2009  . Hx of eye surgery 2018   "Left Eye was swollen and had procedure to remove excess swelling"  . Hyperglycemia 05/01/2011  . Hyperlipidemia   . Hypertension   . Hypogonadism male 04/30/2011  . Lactose intolerance   . Lentigo 10/16/2020  . Mild intermittent asthma 06/30/2018  . Neoplasm of uncertain behavior of skin 10/16/2020  . Obesity (BMI 30-39.9) 09/21/2020  . Osteoarthritis 09/24/2011  . OVERWEIGHT 02/06/2009   Qualifier: Diagnosis of  By: [name]   . Palpitations 09/21/2020  . Paroxysmal atrial fibrillation ([place]) 10/06/2020  . Rash/urticaria 08/07/2012  . Recurrent UTI 07/30/2013  . Routine general medical examination at a health care facility 12/16/2011  . Secondary hypercoagulable state ([place]) 10/06/2020  . SKIN LESION 03/20/2009   Qualifier: Diagnosis of  By: [name]   . Snoring 09/21/2020  . Unspecified asthma(493.90) 02/06/2009   Centricity Description: ASTHMA Qualifier: Diagnosis of  By: [name], [name]   Centricity Description: ASTHMA, INTERMITTENT, MILD Qualifier: Diagnosis of  By: [name], [place] Mallozzi S   . URI (upper respiratory infection) 07/23/2011  . Vitamin D deficiency 09/21/2020    Past Surgical History:  Procedure Laterality Date  . CYSTECTOMY  05/2004   removed fromback /sebaceous cyst  . [place]   left  . mohs procedure  2017   left side of nose  . NASAL SEPTUM SURGERY      Family History  Problem Relation Age of Onset  . Coronary artery disease Father        CABG X5  . Diabetes Father   . Colon cancer Maternal Aunt   . Cancer Brother        lung (smoker)  . Hypertension Other   . Kidney disease Other     Social History   Socioeconomic History  . Marital status:  Married    Spouse name: Not on file  . Number of children: Not on file  . Years of education: Not on file  . Highest education level: Not on file  Occupational History  . Not on file  Tobacco Use  . Smoking status: Former    Types: Cigarettes  . Smokeless tobacco: Never  Vaping Use  . Vaping Use: Never used  Substance and Sexual Activity  . Alcohol use: No    Alcohol/week: 0.0 standard drinks of alcohol  . Drug use: No  . Sexual activity: Not on file  Other Topics Concern  . 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 Difficulty of Paying Living Expenses: Not hard at all  Food Insecurity: No Food Insecurity (05/18/2021)   Hunger Vital Sign   . Worried About Charity fundraiser in the Last Year: Never true   . Ran Out of Food in the Last Year: Never true  Transportation Needs: No Transportation Needs (05/18/2021)   PRAPARE - Transportation   . Lack of Transportation (Medical): No   . Lack of Transportation (Non-Medical): No  Physical Activity: Sufficiently Active (05/18/2021)   Exercise Vital Sign   . Days of Exercise per Week: 5 days   . Minutes of Exercise per Session: 30 min  Stress: No Stress Concern Present (05/18/2021)   [place]   . Feeling of Stress : Not at all  Social Connections: Moderately Integrated (05/18/2021)   Social Connection and Isolation Panel [NHANES]   . Frequency of Communication with Friends and Family: More than three times a week   . Frequency of Social Gatherings with Friends and Family: More than three times a week   . Attends  Religious Services: More than 4 times per year   . Active Member of Clubs or Organizations: No   . Attends Archivist Meetings: Never   . Marital Status: Married  Human resources officer Violence: Not At Risk (05/18/2021)   Humiliation, Afraid, Rape, and Kick questionnaire   . Fear of Current or Ex-Partner: No   . Emotionally Abused: No   . Physically Abused: No   . Sexually Abused: No    Outpatient Medications Prior to Visit  Medication Sig Dispense Refill  . acetaminophen (TYLENOL) 500 MG tablet Take 1 tablet (500 mg total) by mouth every 6 (six) hours as needed. 30 tablet 5  . albuterol (VENTOLIN HFA) 108 (90 Base) MCG/ACT inhaler Inhale 2 puffs into the lungs every 6 (six) hours as needed for wheezing or shortness of breath. 8 g 0  . cetirizine (ZYRTEC) 10 MG tablet Take 10 mg by mouth daily.    [name] ELIQUIS 5 MG TABS tablet TAKE 1 TABLET(5 MG) BY MOUTH TWICE DAILY 60 tablet 5  . flecainide (TAMBOCOR) 50 MG tablet TAKE 1 TABLET(50 MG) BY MOUTH TWICE DAILY 180 tablet 2  . glucosamine-chondroitin 500-400 MG tablet Take 1 tablet by mouth 3 (three) times daily.    [name] guaiFENesin (MUCINEX) 600 MG 12 hr tablet Take 600 mg by mouth 2 (two) times daily as needed.    . Omega-3 Fatty Acids (FISH OIL) 1200 MG CAPS Take by mouth.    [name] VITAMIN D PO Take 1 capsule by mouth daily.    [name] amLODipine (NORVASC) 10 MG tablet Take 1 tablet (10 mg total) by mouth daily. 90 tablet 1  . losartan-hydrochlorothiazide (HYZAAR) 100-25 MG tablet TAKE 1 TABLET BY MOUTH DAILY 90 tablet 1  . metoprolol succinate (TOPROL-XL) 25 MG 24 hr tablet TAKE 1 TABLET(25 MG) BY MOUTH DAILY 90 tablet 1  . omeprazole (PRILOSEC) 40 MG capsule TAKE 1 CAPSULE(40 MG) BY MOUTH DAILY 90 capsule 1  . pravastatin (PRAVACHOL) 40 MG tablet TAKE 1 TABLET(40 MG) BY MOUTH DAILY 90 tablet 1  . tamsulosin (FLOMAX) 0.4 MG  CAPS capsule Take 1 capsule (0.4 mg total) by mouth daily. 90 capsule 1   No facility-administered medications prior to  visit.    Allergies  Allergen Reactions  . Sulfonamide Derivatives     REACTION: Hallucination    Review of Systems  Constitutional:  Negative for fever.       (-)unexpected weight change (-)Adenopathy  HENT:  Negative for congestion and sore throat.        (-)Rhinorrhea   Eyes:        (-)Visual disturbance  Respiratory:  Negative for shortness of breath and wheezing.   Cardiovascular:  Negative for leg swelling.  Genitourinary:  Negative for dysuria, frequency and hematuria.  Musculoskeletal:  Positive for joint pain.       (-)new muscle pain  Skin:        (-)new moles  Psychiatric/Behavioral:  Negative for depression. The patient is not nervous/anxious.        Objective:    Physical Exam Constitutional:      General: He is not in acute distress.    Appearance: Normal appearance. He is not ill-appearing.  HENT:     Head: Normocephalic and atraumatic.     Right Ear: Tympanic membrane, ear canal and external ear normal.     Left Ear: Tympanic membrane, ear canal and external ear normal.  Eyes:     Extraocular Movements: Extraocular movements intact.     Right eye: No nystagmus.     Left eye: No nystagmus.     Pupils: Pupils are equal, round, and reactive to light.  Cardiovascular:     Rate and Rhythm: Normal rate and regular rhythm.     Heart sounds: Normal heart sounds. No murmur heard.    No gallop.  Pulmonary:     Effort: Pulmonary effort is normal. No respiratory distress.     Breath sounds: Normal breath sounds. No wheezing or rales.  Abdominal:     General: Bowel sounds are normal. There is no distension.     Palpations: Abdomen is soft.     Tenderness: There is no abdominal tenderness. There is no guarding.  Musculoskeletal:     Comments: 5/5 strength in both upper and lower extremities  Skin:    General: Skin is warm and dry.     Findings: Lesion (dry, dorsal region left hand) present.  Neurological:     Mental Status: He is alert and oriented to  person, place, and time.     Deep Tendon Reflexes:     Reflex Scores:      Patellar reflexes are 2+ on the right side and 2+ on the left side. Psychiatric:        Judgment: Judgment normal.    BP (!) 129/58 (BP Location: Right Arm, Patient Position: Sitting, Cuff Size: Small)   Pulse 65   Temp 98.5 F (36.9 C) (Oral)   Resp 16   Ht 5' 7"  (1.702 m)   Wt 200 lb (90.7 kg)   SpO2 98%   BMI 31.32 kg/m  Wt Readings from Last 3 Encounters:  12/10/21 200 lb (90.7 kg)  10/22/21 202 lb (91.6 kg)  06/04/21 201 lb (91.2 kg)       Assessment & Plan:   Problem List Items Addressed This Visit       Unprioritized   Routine general medical examination at a health care facility - Primary    Encouraged healthy diet and regular exercise. Colo up to date. Encouraged him to get a  bivalent covid booster.       Hyperlipidemia    Lab Results  Component Value Date   CHOL 188 09/11/2020   HDL 40.00 09/11/2020   LDLCALC 80 12/31/2018   LDLDIRECT 116.0 09/11/2020   TRIG 378.0 (H) 09/11/2020   CHOLHDL 5 09/11/2020  Check lipid panel. Continue pravachol.        Relevant Medications   sildenafil (VIAGRA) 50 MG tablet   amLODipine (NORVASC) 10 MG tablet   losartan-hydrochlorothiazide (HYZAAR) 100-25 MG tablet   metoprolol succinate (TOPROL-XL) 25 MG 24 hr tablet   pravastatin (PRAVACHOL) 40 MG tablet   Other Relevant Orders   Lipid panel   GERD (gastroesophageal reflux disease)    Stable on omeprazole 40 mg. Continue same.       Relevant Medications   omeprazole (PRILOSEC) 40 MG capsule   Essential hypertension    BP Readings from Last 3 Encounters:  12/10/21 (!) 129/58  10/22/21 122/66  06/04/21 (!) 147/64  Stable on hyzaar, amlodipine and metoprolol.       Relevant Medications   sildenafil (VIAGRA) 50 MG tablet   amLODipine (NORVASC) 10 MG tablet   losartan-hydrochlorothiazide (HYZAAR) 100-25 MG tablet   metoprolol succinate (TOPROL-XL) 25 MG 24 hr tablet   pravastatin  (PRAVACHOL) 40 MG tablet   Other Relevant Orders   Comp Met (CMET)   BPH (benign prostatic hyperplasia)    Lab Results  Component Value Date   PSA 0.99 01/11/2020   PSA 0.64 12/31/2018   PSA 0.84 12/05/2017   Stable on flomax.  Continue same.       Relevant Medications   tamsulosin (FLOMAX) 0.4 MG CAPS capsule   Borderline diabetes    Lab Results  Component Value Date   HGBA1C 6.3 06/04/2021   HGBA1C 6.0 09/12/2020   HGBA1C 5.8 01/11/2020   Lab Results  Component Value Date   MICROALBUR 0.50 01/05/2014   LDLCALC 80 12/31/2018   CREATININE 0.87 06/04/2021  Reinforced diet and exercise.  Obtain follow up A1C.       Relevant Orders   Hemoglobin A1c   Other Visit Diagnoses     Skin lesion       Relevant Orders   Ambulatory referral to Dermatology        Meds ordered this encounter  Medications  . sildenafil (VIAGRA) 50 MG tablet    Sig: 1/2 to 1 tablet prior to sexual activity    Dispense:  30 tablet    Refill:  1    Order Specific Question:   Supervising Provider    Answer:   Penni Homans A [[id number]]  . amLODipine (NORVASC) 10 MG tablet    Sig: Take 1 tablet (10 mg total) by mouth daily.    Dispense:  90 tablet    Refill:  1    Order Specific Question:   Supervising Provider    Answer:   Penni Homans A [[id number]]  . losartan-hydrochlorothiazide (HYZAAR) 100-25 MG tablet    Sig: Take 1 tablet by mouth daily.    Dispense:  90 tablet    Refill:  1    Order Specific Question:   Supervising Provider    Answer:   Penni Homans A [[id number]]  . metoprolol succinate (TOPROL-XL) 25 MG 24 hr tablet    Sig: TAKE 1 TABLET(25 MG) BY MOUTH DAILY    Dispense:  90 tablet    Refill:  1    Needs follow up ov    Order Specific Question:  Supervising Provider    Answer:   Penni Homans A [[id number]]  . omeprazole (PRILOSEC) 40 MG capsule    Sig: TAKE 1 CAPSULE(40 MG) BY MOUTH DAILY    Dispense:  90 capsule    Refill:  1    Order Specific Question:   Supervising Provider     Answer:   Penni Homans A [[id number]]  . pravastatin (PRAVACHOL) 40 MG tablet    Sig: TAKE 1 TABLET(40 MG) BY MOUTH DAILY    Dispense:  90 tablet    Refill:  1    Order Specific Question:   Supervising Provider    Answer:   Penni Homans A [[id number]]  . tamsulosin (FLOMAX) 0.4 MG CAPS capsule    Sig: Take 1 capsule (0.4 mg total) by mouth daily.    Dispense:  90 capsule    Refill:  1    Order Specific Question:   Supervising Provider    Answer:   Penni Homans A [[id number]]    I, Nance Pear, NP, personally preformed the services described in this documentation.  All medical record entries made by the scribe were at my direction and in my presence.  I have reviewed the chart and discharge instructions (if applicable) and agree that the record reflects my personal performance and is accurate and complete. 12/10/2021   I,Shehryar Baig,acting as a Education administrator for Nance Pear, NP.,have documented all relevant documentation on the behalf of Nance Pear, NP,as directed by  Nance Pear, NP while in the presence of Nance Pear, NP.   Nance Pear, NP

## 2021-12-10 NOTE — Assessment & Plan Note (Signed)
Lab Results  Component Value Date   PSA 0.99 01/11/2020   PSA 0.64 12/31/2018   PSA 0.84 12/05/2017    Stable on flomax.  Continue same.

## 2021-12-10 NOTE — Assessment & Plan Note (Addendum)
BP Readings from Last 3 Encounters:  12/10/21 (!) 129/58  10/22/21 122/66  06/04/21 (!) 147/64   Stable on hyzaar, amlodipine and metoprolol.

## 2021-12-10 NOTE — Patient Instructions (Signed)
Please complete lab work prior to leaving.   

## 2021-12-10 NOTE — Addendum Note (Signed)
Addended by: Jiles Prows on: 12/10/2021 02:23 PM   Modules accepted: Orders

## 2021-12-10 NOTE — Assessment & Plan Note (Addendum)
Encouraged healthy diet and regular exercise. Colo up to date. Encouraged him to get a bivalent covid booster.

## 2021-12-10 NOTE — Assessment & Plan Note (Signed)
Lab Results  Component Value Date   CHOL 188 09/11/2020   HDL 40.00 09/11/2020   LDLCALC 80 12/31/2018   LDLDIRECT 116.0 09/11/2020   TRIG 378.0 (H) 09/11/2020   CHOLHDL 5 09/11/2020   Check lipid panel. Continue pravachol.

## 2021-12-10 NOTE — Assessment & Plan Note (Signed)
Lab Results  Component Value Date   HGBA1C 6.3 06/04/2021   HGBA1C 6.0 09/12/2020   HGBA1C 5.8 01/11/2020   Lab Results  Component Value Date   MICROALBUR 0.50 01/05/2014   LDLCALC 80 12/31/2018   CREATININE 0.87 06/04/2021   Reinforced diet and exercise.  Obtain follow up A1C.

## 2021-12-11 ENCOUNTER — Encounter: Payer: Self-pay | Admitting: Family

## 2021-12-11 LAB — LIPID PANEL
Cholesterol: 171 mg/dL (ref 0–200)
HDL: 39.5 mg/dL
LDL Cholesterol: 92 mg/dL (ref 0–99)
NonHDL: 131.75
Total CHOL/HDL Ratio: 4
Triglycerides: 200 mg/dL — ABNORMAL HIGH (ref 0.0–149.0)
VLDL: 40 mg/dL (ref 0.0–40.0)

## 2021-12-11 LAB — HEMOGLOBIN A1C: Hgb A1c MFr Bld: 6.1 % (ref 4.6–6.5)

## 2021-12-11 LAB — COMPREHENSIVE METABOLIC PANEL
ALT: 15 U/L (ref 0–53)
AST: 16 U/L (ref 0–37)
Albumin: 4.4 g/dL (ref 3.5–5.2)
Alkaline Phosphatase: 55 U/L (ref 39–117)
BUN: 16 mg/dL (ref 6–23)
CO2: 27 mEq/L (ref 19–32)
Calcium: 9.2 mg/dL (ref 8.4–10.5)
Chloride: 103 mEq/L (ref 96–112)
Creatinine, Ser: 0.9 mg/dL (ref 0.40–1.50)
GFR: 88.43 mL/min (ref >60.00)
Glucose, Bld: 86 mg/dL (ref 70–99)
Potassium: 3.9 mEq/L (ref 3.5–5.1)
Sodium: 138 mEq/L (ref 135–145)
Total Bilirubin: 0.5 mg/dL (ref 0.2–1.2)
Total Protein: 6.7 g/dL (ref 6.0–8.3)

## 2021-12-12 MED ORDER — SILDENAFIL CITRATE 20 MG PO TABS: ORAL_TABLET | ORAL | 5 refills | Status: DC

## 2021-12-18 ENCOUNTER — Other Ambulatory Visit: Payer: Self-pay | Admitting: Family

## 2021-12-21 ENCOUNTER — Other Ambulatory Visit: Payer: Self-pay

## 2021-12-21 ENCOUNTER — Telehealth: Payer: Self-pay

## 2021-12-21 DIAGNOSIS — I480 Paroxysmal atrial fibrillation: Secondary | ICD-10-CM

## 2021-12-21 NOTE — Telephone Encounter (Signed)
Spoke with pt and went over ablation procedure and scheduled lab appt. Pt is aware that we will send letters via MyChart for CT and Ablation instructions.   Ablation is scheduled for 8/11 Lab appt: 7[place]31 [place]

## 2022-01-08 ENCOUNTER — Encounter: Payer: Self-pay | Admitting: *Deleted

## 2022-01-25 ENCOUNTER — Other Ambulatory Visit: Payer: Medicare Other

## 2022-01-28 ENCOUNTER — Other Ambulatory Visit: Payer: Medicare Other

## 2022-01-28 DIAGNOSIS — I480 Paroxysmal atrial fibrillation: Secondary | ICD-10-CM

## 2022-01-28 LAB — CBC WITH DIFFERENTIAL/PLATELET
Basophils Absolute: 0 10*3/uL (ref 0.0–0.2)
Basos: 0 %
EOS (ABSOLUTE): 0.2 10*3/uL (ref 0.0–0.4)
Eos: 3 %
Hematocrit: 40.4 % (ref 37.5–51.0)
Hemoglobin: 13.4 g/dL (ref 13.0–17.7)
Lymphocytes Absolute: 1.7 10*3/uL (ref 0.7–3.1)
Lymphs: 34 %
MCH: 29.4 pg (ref 26.6–33.0)
MCHC: 33.2 g/dL (ref 31.5–35.7)
MCV: 89 fL (ref 79–97)
Monocytes Absolute: 0.6 10*3/uL (ref 0.1–0.9)
Monocytes: 12 %
Neutrophils Absolute: 2.5 10*3/uL (ref 1.4–7.0)
Neutrophils: 51 %
Platelets: 142 10*3/uL — ABNORMAL LOW (ref 150–450)
RBC: 4.56 x10E6/uL (ref 4.14–5.80)
RDW: 14.6 % (ref 11.6–15.4)
WBC: 5 10*3/uL (ref 3.4–10.8)

## 2022-01-28 LAB — BASIC METABOLIC PANEL
BUN/Creatinine Ratio: 21 (ref 10–24)
BUN: 16 mg/dL (ref 8–27)
CO2: 28 mmol/L (ref 20–29)
Calcium: 8.9 mg/dL (ref 8.6–10.2)
Chloride: 103 mmol/L (ref 96–106)
Creatinine, Ser: 0.78 mg/dL (ref 0.76–1.27)
Glucose: 157 mg/dL — ABNORMAL HIGH (ref 70–99)
Potassium: 3.7 mmol/L (ref 3.5–5.2)
Sodium: 138 mmol/L (ref 134–144)
eGFR: 98 mL/min/{1.73_m2} (ref >59)

## 2022-01-31 ENCOUNTER — Telehealth (HOSPITAL_COMMUNITY): Payer: Self-pay | Admitting: *Deleted

## 2022-01-31 NOTE — Telephone Encounter (Signed)
Patient returning call regarding upcoming cardiac imaging study; pt verbalizes understanding of appt date/time, parking situation and where to check in, pre-test NPO status and verified current allergies; name and call back number provided for further questions should they arise  Aaron Clement RN Navigator Cardiac [facility] [phone] office [phone] cell  Patient aware to arrive at 1pm.

## 2022-01-31 NOTE — Telephone Encounter (Signed)
Attempted to call patient regarding upcoming cardiac CT appointment. °Left message on voicemail with name and callback number ° °Shatonya Passon RN Navigator Cardiac Imaging °[facility] °[phone] Office °[phone] Cell ° °

## 2022-02-03 ENCOUNTER — Encounter (HOSPITAL_COMMUNITY): Payer: Self-pay

## 2022-02-03 ENCOUNTER — Telehealth (HOSPITAL_COMMUNITY): Payer: Self-pay | Admitting: Emergency Medicine

## 2022-02-03 NOTE — Telephone Encounter (Signed)
Calling patient to move his PV CTA appt due to reading [place].  Pt states ablation appt on Friday 8/11. Moved PV CTA appt to WED 8/9 at [place] ([facility])  Pt appreciated the call. Marchia Bond RN Navigator Cardiac Imaging [facility] [phone] Office  [phone] Cell  Sending mychart message with new address, info for PV CTA

## 2022-02-04 ENCOUNTER — Ambulatory Visit (HOSPITAL_COMMUNITY): Admission: RE | Admit: 2022-02-04 | Payer: Medicare Other | Source: Ambulatory Visit

## 2022-02-05 ENCOUNTER — Telehealth (HOSPITAL_COMMUNITY): Payer: Self-pay | Admitting: *Deleted

## 2022-02-05 NOTE — Telephone Encounter (Signed)
Reaching out to patient to offer assistance regarding upcoming cardiac imaging study; pt verbalizes understanding of appt date/time, parking situation and where to check in, pre-test NPO status and verified current allergies; name and call back number provided for further questions should they arise  Gordy Clement RN Navigator Cardiac Imaging [facility] [phone] office [phone] cell  Patient aware to arrive at [facility] at 1:15pm.

## 2022-02-06 ENCOUNTER — Ambulatory Visit (HOSPITAL_BASED_OUTPATIENT_CLINIC_OR_DEPARTMENT_OTHER)
Admission: RE | Admit: 2022-02-06 | Discharge: 2022-02-06 | Disposition: A | Discharge status: Home / Self Care | Payer: Medicare Other | Source: Ambulatory Visit | Attending: Cardiology | Admitting: Cardiology

## 2022-02-06 DIAGNOSIS — I480 Paroxysmal atrial fibrillation: Secondary | ICD-10-CM | POA: Insufficient documentation

## 2022-02-06 MED ORDER — IOHEXOL 350 MG/ML SOLN
100.0000 mL | Freq: Once | INTRAVENOUS | Status: AC | PRN
  Administered 2022-02-06: 80 mL via INTRAVENOUS

## 2022-02-07 NOTE — Pre-Procedure Instructions (Signed)
Attempted to call patient regarding procedure instructions.  Left voice mail on the following items: Arrival time 0830 Nothing to eat or drink after midnight No meds AM of procedure Responsible person to drive you home and stay with you for 24 hrs  Have you missed any doses of anti-coagulant Eliquis- if you have missed any doses please let us know.

## 2022-02-08 ENCOUNTER — Other Ambulatory Visit: Payer: Self-pay

## 2022-02-08 ENCOUNTER — Ambulatory Visit (HOSPITAL_COMMUNITY)
Admission: RE | Disposition: A | Discharge status: Home / Self Care | Payer: Medicare Other | Source: Home / Self Care | Attending: Cardiology

## 2022-02-08 ENCOUNTER — Ambulatory Visit (HOSPITAL_BASED_OUTPATIENT_CLINIC_OR_DEPARTMENT_OTHER): Payer: Medicare Other | Admitting: Anesthesiology

## 2022-02-08 ENCOUNTER — Ambulatory Visit (HOSPITAL_COMMUNITY): Payer: Medicare Other | Admitting: Anesthesiology

## 2022-02-08 ENCOUNTER — Ambulatory Visit (HOSPITAL_COMMUNITY)
Admission: RE | Admit: 2022-02-08 | Discharge: 2022-02-08 | Disposition: A | Discharge status: Home / Self Care | Payer: Medicare Other | Attending: Cardiology | Admitting: Cardiology

## 2022-02-08 DIAGNOSIS — Z7901 Long term (current) use of anticoagulants: Secondary | ICD-10-CM | POA: Insufficient documentation

## 2022-02-08 DIAGNOSIS — I10 Essential (primary) hypertension: Secondary | ICD-10-CM

## 2022-02-08 DIAGNOSIS — I480 Paroxysmal atrial fibrillation: Secondary | ICD-10-CM | POA: Diagnosis not present

## 2022-02-08 DIAGNOSIS — E785 Hyperlipidemia, unspecified: Secondary | ICD-10-CM | POA: Diagnosis not present

## 2022-02-08 DIAGNOSIS — I4891 Unspecified atrial fibrillation: Secondary | ICD-10-CM

## 2022-02-08 DIAGNOSIS — J45909 Unspecified asthma, uncomplicated: Secondary | ICD-10-CM

## 2022-02-08 DIAGNOSIS — Z87891 Personal history of nicotine dependence: Secondary | ICD-10-CM

## 2022-02-08 DIAGNOSIS — D649 Anemia, unspecified: Secondary | ICD-10-CM | POA: Diagnosis not present

## 2022-02-08 HISTORY — PX: ATRIAL FIBRILLATION ABLATION: EP1191

## 2022-02-08 LAB — POCT ACTIVATED CLOTTING TIME
Activated Clotting Time: 293 seconds
Activated Clotting Time: 329 seconds

## 2022-02-08 LAB — GLUCOSE, CAPILLARY: Glucose-Capillary: 134 mg/dL — ABNORMAL HIGH (ref 70–99)

## 2022-02-08 SURGERY — ATRIAL FIBRILLATION ABLATION
Anesthesia: General

## 2022-02-08 MED ORDER — DOBUTAMINE INFUSION FOR EP/ECHO/NUC (1000 MCG/ML)
INTRAVENOUS | Status: DC | PRN
  Administered 2022-02-08: 20 ug/kg/min via INTRAVENOUS

## 2022-02-08 MED ORDER — DOBUTAMINE INFUSION FOR EP/ECHO/NUC (1000 MCG/ML)
INTRAVENOUS | Status: AC
  Filled 2022-02-08: qty 250

## 2022-02-08 MED ORDER — HEPARIN SODIUM (PORCINE) 1000 UNIT/ML IJ SOLN
INTRAMUSCULAR | Status: DC | PRN
  Administered 2022-02-08: 14000 [IU] via INTRAVENOUS

## 2022-02-08 MED ORDER — HEPARIN SODIUM (PORCINE) 1000 UNIT/ML IJ SOLN
INTRAMUSCULAR | Status: AC
  Filled 2022-02-08: qty 10

## 2022-02-08 MED ORDER — PROPOFOL 10 MG/ML IV BOLUS
INTRAVENOUS | Status: DC | PRN
  Administered 2022-02-08: 150 mg via INTRAVENOUS

## 2022-02-08 MED ORDER — SODIUM CHLORIDE 0.9% FLUSH
3.0000 mL | INTRAVENOUS | Status: DC | PRN
Start: 2022-02-08 — End: 2022-02-08

## 2022-02-08 MED ORDER — SODIUM CHLORIDE 0.9 % IV SOLN: 250.0000 mL | INTRAVENOUS | Status: DC | PRN

## 2022-02-08 MED ORDER — PROTAMINE SULFATE 10 MG/ML IV SOLN
INTRAVENOUS | Status: DC | PRN
  Administered 2022-02-08: 40 mg via INTRAVENOUS

## 2022-02-08 MED ORDER — ACETAMINOPHEN 325 MG PO TABS
650.0000 mg | ORAL_TABLET | ORAL | Status: DC | PRN
  Administered 2022-02-08: 650 mg via ORAL
  Filled 2022-02-08: qty 2

## 2022-02-08 MED ORDER — DEXAMETHASONE SODIUM PHOSPHATE 10 MG/ML IJ SOLN
INTRAMUSCULAR | Status: DC | PRN
  Administered 2022-02-08: 10 mg via INTRAVENOUS

## 2022-02-08 MED ORDER — ROCURONIUM BROMIDE 10 MG/ML (PF) SYRINGE
PREFILLED_SYRINGE | INTRAVENOUS | Status: DC | PRN
  Administered 2022-02-08: 60 mg via INTRAVENOUS

## 2022-02-08 MED ORDER — HEPARIN (PORCINE) IN NACL 1000-0.9 UT/500ML-% IV SOLN
INTRAVENOUS | Status: AC
  Filled 2022-02-08: qty 2000

## 2022-02-08 MED ORDER — FENTANYL CITRATE (PF) 250 MCG/5ML IJ SOLN
INTRAMUSCULAR | Status: DC | PRN
  Administered 2022-02-08: 100 ug via INTRAVENOUS

## 2022-02-08 MED ORDER — HEPARIN (PORCINE) IN NACL 1000-0.9 UT/500ML-% IV SOLN
INTRAVENOUS | Status: DC | PRN
  Administered 2022-02-08 (×3): 500 mL

## 2022-02-08 MED ORDER — LIDOCAINE 2% (20 MG/ML) 5 ML SYRINGE
INTRAMUSCULAR | Status: DC | PRN
  Administered 2022-02-08: 100 mg via INTRAVENOUS

## 2022-02-08 MED ORDER — HEPARIN SODIUM (PORCINE) 1000 UNIT/ML IJ SOLN
INTRAMUSCULAR | Status: DC | PRN
  Administered 2022-02-08: 1000 [IU] via INTRAVENOUS

## 2022-02-08 MED ORDER — ONDANSETRON HCL 4 MG/2ML IJ SOLN: 4.0000 mg | Freq: Four times a day (QID) | INTRAMUSCULAR | Status: DC | PRN

## 2022-02-08 MED ORDER — SUGAMMADEX SODIUM 200 MG/2ML IV SOLN
INTRAVENOUS | Status: DC | PRN
  Administered 2022-02-08: 200 mg via INTRAVENOUS

## 2022-02-08 MED ORDER — ONDANSETRON HCL 4 MG/2ML IJ SOLN
INTRAMUSCULAR | Status: DC | PRN
  Administered 2022-02-08: 4 mg via INTRAVENOUS

## 2022-02-08 MED ORDER — SODIUM CHLORIDE 0.9 % IV SOLN: INTRAVENOUS | Status: DC

## 2022-02-08 MED ORDER — MIDAZOLAM HCL 2 MG/2ML IJ SOLN
INTRAMUSCULAR | Status: DC | PRN
  Administered 2022-02-08: 2 mg via INTRAVENOUS

## 2022-02-08 SURGICAL SUPPLY — 20 items
BAG SNAP BAND KOVER 36X36 (MISCELLANEOUS) ×1 IMPLANT
CATH 8FR REPROCESSED SOUNDSTAR (CATHETERS) ×2 IMPLANT
CATH 8FR SOUNDSTAR REPROCESSED (CATHETERS) IMPLANT
CATH ABLAT QDOT MICRO BI TC FJ (CATHETERS) ×1 IMPLANT
CATH OCTARAY 1.5 F (CATHETERS) ×1 IMPLANT
CATH S CIRCA THERM PROBE 10F (CATHETERS) ×1 IMPLANT
CATH WEB BI DIR CSDF CRV REPRO (CATHETERS) ×1 IMPLANT
CLOSURE PERCLOSE PROSTYLE (VASCULAR PRODUCTS) ×4 IMPLANT
COVER SWIFTLINK CONNECTOR (BAG) ×3 IMPLANT
KIT VERSACROSS STEERABLE D1 (CATHETERS) ×1 IMPLANT
PACK EP LATEX FREE (CUSTOM PROCEDURE TRAY) ×2
PACK EP LF (CUSTOM PROCEDURE TRAY) ×2 IMPLANT
PAD DEFIB RADIO PHYSIO CONN (PAD) ×3 IMPLANT
PATCH CARTO3 (PAD) ×1 IMPLANT
SHEATH CARTO VIZIGO SM CVD (SHEATH) ×1 IMPLANT
SHEATH PINNACLE 7F 10CM (SHEATH) ×1 IMPLANT
SHEATH PINNACLE 8F 10CM (SHEATH) ×2 IMPLANT
SHEATH PINNACLE 9F 10CM (SHEATH) ×1 IMPLANT
SHEATH PROBE COVER 6X72 (BAG) ×1 IMPLANT
TUBING SMART ABLATE COOLFLOW (TUBING) ×1 IMPLANT

## 2022-02-08 NOTE — Anesthesia Procedure Notes (Signed)
Procedure Name: Intubation Date/Time: 02/08/2022 10:08 AM  Performed by: Amadeo Garnet, CRNAPre-anesthesia Checklist: Patient identified, Emergency Drugs available, Suction available and Patient being monitored Patient Re-evaluated:Patient Re-evaluated prior to induction Oxygen Delivery Method: Circle system utilized Preoxygenation: Pre-oxygenation with 100% oxygen Induction Type: IV induction Ventilation: Mask ventilation without difficulty and Oral airway inserted - appropriate to patient size Laryngoscope Size: Mac and 4 Grade View: Grade II Tube type: Oral Tube size: 7.5 mm Number of attempts: 1 Airway Equipment and Method: Stylet and Oral airway Placement Confirmation: ETT inserted through vocal cords under direct vision, positive ETCO2 and breath sounds checked- equal and bilateral Secured at: 22 cm Tube secured with: Tape Dental Injury: Teeth and Oropharynx as per pre-operative assessment

## 2022-02-08 NOTE — Transfer of Care (Signed)
Immediate Anesthesia Transfer of Care Note  Patient: Aaron Ruiz  Procedure(s) Performed: ATRIAL FIBRILLATION ABLATION  Patient Location: Cath Lab  Anesthesia Type:General  Level of Consciousness: awake, alert  and oriented  Airway & Oxygen Therapy: Patient Spontanous Breathing and Patient connected to nasal cannula oxygen  Post-op Assessment: Report given to RN, Post -op Vital signs reviewed and stable and Patient moving all extremities  Post vital signs: Reviewed and stable  Last Vitals:  Vitals Value Taken Time  BP 149/71 02/08/22 1148  Temp    Pulse 77 02/08/22 1150  Resp 17 02/08/22 1150  SpO2 96 % 02/08/22 1150  Vitals shown include unvalidated device data.  Last Pain:  Vitals:   02/08/22 0902  TempSrc: Temporal         Complications: There were no known notable events for this encounter.

## 2022-02-08 NOTE — Discharge Instructions (Addendum)

## 2022-02-08 NOTE — H&P (Signed)
Electrophysiology Office Note   Date:  02/08/2022   ID:  Aaron Ruiz, DOB Jul 12, 1955, MRN [id number]  PCP:  [name], NP  Cardiologist:  [name] Primary Electrophysiologist:  Janet Humphreys Meredith Leeds, MD    Chief Complaint: AF   History of Present Illness: Aaron Ruiz is a 65 y.o. male who is being seen today for the evaluation of AF at the request of No ref. provider found. Presenting today for electrophysiology evaluation.  He has a history significant for hypertension, hyperlipidemia, atrial fibrillation.  He was initially diagnosed with atrial fibrillation May 2022 when he went to [facility] with palpitations.  He was started on Eliquis.  He wore a cardiac monitor that showed more episodes of atrial fibrillation with symptoms of weakness and fatigue.  He was started on flecainide.  Today, denies symptoms of palpitations, chest pain, shortness of breath, orthopnea, PND, lower extremity edema, claudication, dizziness, presyncope, syncope, bleeding, or neurologic sequela. The patient is tolerating medications without difficulties. Plan AF ablation today.    Past Medical History:  Diagnosis Date   Actinic keratosis 03/20/2009   Qualifier: Diagnosis of  By: Shawna Orleans DO, D. 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   Colon polyps    Daytime somnolence 09/21/2020   Diverticulosis    Essential hypertension 02/06/2009   Qualifier: Diagnosis of  By: [name], [name]      Fatigue 09/21/2020   Former smoker 09/21/2020   General medical examination 11/14/2010   GERD 02/06/2009   Qualifier: Diagnosis of  By: [name], [name]     GERD  (gastroesophageal reflux disease)    on meds   High cholesterol 02/06/2009   Qualifier: Diagnosis of  By: [name], [name]     Hx of adenomatous colonic polyps 03/07/2009   Hx of eye surgery 2018   "Left Eye was swollen and had procedure to remove excess swelling"   Hyperglycemia 05/01/2011   Hyperlipidemia    Hypertension    Hypogonadism male 04/30/2011   Lactose intolerance    Lentigo 10/16/2020   Mild intermittent asthma 06/30/2018   Neoplasm of uncertain behavior of skin 10/16/2020   Obesity (BMI 30-39.9) 09/21/2020   Osteoarthritis 09/24/2011   OVERWEIGHT 02/06/2009   Qualifier: Diagnosis of  By: [name]    Palpitations 09/21/2020   Paroxysmal atrial fibrillation ([place]) 10/06/2020   Rash/urticaria 08/07/2012   Recurrent UTI 07/30/2013   Routine general medical examination at a health care facility 12/16/2011   Secondary hypercoagulable state ([place]) 10/06/2020   SKIN LESION 03/20/2009   Qualifier: Diagnosis of  By: [name]    Snoring 09/21/2020   Unspecified asthma(493.90) 02/06/2009   Centricity Description: ASTHMA Qualifier: Diagnosis of  By: [name], [name]   Centricity Description: ASTHMA, INTERMITTENT, MILD Qualifier: Diagnosis of  By: [name], [name] S    URI (upper respiratory infection) 07/23/2011   Vitamin D deficiency 09/21/2020   Past Surgical History:  Procedure Laterality Date   CYSTECTOMY  05/2004   removed fromback Ethelle Lyon cyst   [place]  left   mohs procedure  2017   left side of nose   NASAL SEPTUM SURGERY       Current Facility-Administered Medications  Medication Dose Route Frequency Provider Last Rate Last Admin   0.9 %  sodium chloride infusion   Intravenous Continuous Amadea Keagy, Ocie Doyne, MD        Allergies:   Sulfonamide derivatives   Social History:  The patient  reports that he has quit smoking. His smoking use included cigarettes. He has never used smokeless tobacco. He reports that he does not drink  alcohol and does not use drugs.   Family History:  The patient's family history includes Cancer in his brother; Colon cancer in his maternal aunt; Coronary artery disease in his father; Diabetes in his father; Hypertension in an other family member; Kidney disease in an other family member.   ROS:  Please see the history of present illness.   Otherwise, review of systems is positive for none.   All other systems are reviewed and negative.   PHYSICAL EXAM: VS:  BP 136/72   Pulse 63   Temp 98.3 F (36.8 C) (Temporal)   Resp 18   Ht '5\' 7"'$  (1.702 m)   Wt 90.7 kg   SpO2 97%   BMI 31.32 kg/m  , BMI Body mass index is 31.32 kg/m. GEN: Well nourished, well developed, in no acute distress  HEENT: normal  Neck: no JVD, carotid bruits, or masses Cardiac: RRR; no murmurs, rubs, or gallops,no edema  Respiratory:  clear to auscultation bilaterally, normal work of breathing GI: soft, nontender, nondistended, + BS MS: no deformity or atrophy  Skin: warm and dry Neuro:  Strength and sensation are intact Psych: euthymic mood, full affect   Recent Labs: 12/10/2021: ALT 15 01/28/2022: BUN 16; Creatinine, Ser 0.78; Hemoglobin 13.4; Platelets 142; Potassium 3.7; Sodium 138    Lipid Panel     Component Value Date/Time   CHOL 171 12/10/2021 1212   TRIG 200.0 (H) 12/10/2021 1212   HDL 39.50 12/10/2021 1212   CHOLHDL 4 12/10/2021 1212   VLDL 40.0 12/10/2021 1212   LDLCALC 92 12/10/2021 1212   LDLDIRECT 116.0 09/11/2020 1232     Wt Readings from Last 3 Encounters:  02/08/22 90.7 kg  12/10/21 90.7 kg  10/22/21 91.6 kg      Other studies Reviewed: Additional studies/ records that were reviewed today include: TTE 11/01/20  Review of the above records today demonstrates:   1. Left ventricular ejection fraction, by estimation, is 60 to 65%. The  left ventricle has normal function. The left ventricle has no regional  wall motion abnormalities. Left ventricular diastolic parameters were   normal.   2. Right ventricular systolic function is normal. The right ventricular  size is normal.   3. The mitral valve is normal in structure. Trivial mitral valve  regurgitation. No evidence of mitral stenosis.   4. The aortic valve is normal in it this point, he is not quite ready for ablation structure. Aortic valve regurgitation is  not visualized. No aortic stenosis is present.   5. The inferior vena cava is normal in size with greater than 50%  respiratory variability, suggesting right atrial pressure of 3 mmHg.   Cardiac monitor 10/11/2020 personally reviewed This study is remarkable for symptomatic atrial fibrillation/atrial flutter and paroxysmal atrial tachycardia.  ASSESSMENT AND PLAN:  1.  Paroxysmal atrial fibrillation: Worley Radermacher Hach has presented today for surgery, with the diagnosis of AF.  The various methods  of treatment have been discussed with the patient and family. After consideration of risks, benefits and other options for treatment, the patient has consented to  Procedure(s): Catheter ablation as a surgical intervention .  Risks include but not limited to complete heart block, stroke, esophageal damage, nerve damage, bleeding, vascular damage, tamponade, perforation, MI, and death. The patient's history has been reviewed, patient examined, no change in status, stable for surgery.  I have reviewed the patient's chart and labs.  Questions were answered to the patient's satisfaction.    [place] Curt Bears, MD 02/08/2022 9:08 AM

## 2022-02-08 NOTE — Anesthesia Preprocedure Evaluation (Addendum)
Anesthesia Evaluation  Patient identified by MRN, date of birth, ID band Patient awake    Reviewed: Allergy & Precautions, H&P , NPO status , Patient's Chart, lab work & pertinent test results  Airway Mallampati: II  TM Distance: >3 FB Neck ROM: Full    Dental no notable dental hx.    Pulmonary neg pulmonary ROS, asthma , former smoker,    Pulmonary exam normal breath sounds clear to auscultation       Cardiovascular Exercise Tolerance: Good hypertension, Pt. on medications negative cardio ROS Normal cardiovascular exam Rhythm:Regular Rate:Normal     Neuro/Psych negative neurological ROS  negative psych ROS   GI/Hepatic Neg liver ROS, GERD  Medicated,  Endo/Other  negative endocrine ROS  Renal/GU negative Renal ROS  negative genitourinary   Musculoskeletal negative musculoskeletal ROS (+) Arthritis , Osteoarthritis,    Abdominal   Peds negative pediatric ROS (+)  Hematology negative hematology ROS (+) Blood dyscrasia, anemia ,   Anesthesia Other Findings   Reproductive/Obstetrics negative OB ROS                            Anesthesia Physical Anesthesia Plan  ASA: 3  Anesthesia Plan: General   Post-op Pain Management: Minimal or no pain anticipated   Induction: Intravenous  PONV Risk Score and Plan: Ondansetron and Treatment may vary due to age or medical condition  Airway Management Planned: Oral ETT  Additional Equipment: None  Intra-op Plan:   Post-operative Plan: Extubation in OR  Informed Consent: I have reviewed the patients History and Physical, chart, labs and discussed the procedure including the risks, benefits and alternatives for the proposed anesthesia with the patient or authorized representative who has indicated his/her understanding and acceptance.       Plan Discussed with: Anesthesiologist and CRNA  Anesthesia Plan Comments: (  )         Anesthesia Quick Evaluation

## 2022-02-08 NOTE — Anesthesia Postprocedure Evaluation (Signed)
Anesthesia Post Note  Patient: Aaron Ruiz  Procedure(s) Performed: ATRIAL FIBRILLATION ABLATION     Patient location during evaluation: PACU Anesthesia Type: General Level of consciousness: awake and alert Pain management: pain level controlled Vital Signs Assessment: post-procedure vital signs reviewed and stable Respiratory status: spontaneous breathing, nonlabored ventilation, respiratory function stable and patient connected to nasal cannula oxygen Cardiovascular status: blood pressure returned to baseline and stable Postop Assessment: no apparent nausea or vomiting Anesthetic complications: no   There were no known notable events for this encounter.  Last Vitals:  Vitals:   02/08/22 1330 02/08/22 1400  BP: (!) 141/74 (!) 144/75  Pulse: 77 80  Resp: (!) 22 20  Temp:    SpO2: 95% 95%    Last Pain:  Vitals:   02/08/22 1220  TempSrc: Temporal  PainSc:                  [place]

## 2022-02-11 ENCOUNTER — Encounter (HOSPITAL_COMMUNITY): Payer: Self-pay | Admitting: Cardiology

## 2022-02-12 MED FILL — Dobutamine in Dextrose 5% Inj 1 MG/ML: INTRAVENOUS | Qty: 250 | Status: AC

## 2022-02-12 MED FILL — Heparin Sod (Porcine)-NaCl IV Soln 1000 Unit/500ML-0.9%: INTRAVENOUS | Qty: 500 | Status: AC

## 2022-02-13 ENCOUNTER — Telehealth: Payer: Self-pay | Admitting: Internal Medicine

## 2022-02-13 NOTE — Telephone Encounter (Signed)
Patient with CT scan last week followed by AF ablation last Firday. Stayed at hotel last night. Today broke out with pruritic rash on his trunk. Has tken benadry and some prednisone from his wife. No new medications. I assured him that this was unlikely to be related to his procedure or medications. Agree with benadryl and prednisone. If getting worse go to ER or urgent care.

## 2022-02-14 DIAGNOSIS — L239 Allergic contact dermatitis, unspecified cause: Secondary | ICD-10-CM | POA: Diagnosis not present

## 2022-02-15 NOTE — Telephone Encounter (Signed)
Per Dr. Curt Bears this has been handled by Bensimhon/office.  I do not need to follow up at this time.

## 2022-03-08 ENCOUNTER — Ambulatory Visit (HOSPITAL_COMMUNITY)
Admission: RE | Admit: 2022-03-08 | Discharge: 2022-03-08 | Disposition: A | Discharge status: Home / Self Care | Payer: Medicare Other | Source: Ambulatory Visit | Attending: Physician Assistant | Admitting: Physician Assistant

## 2022-03-08 ENCOUNTER — Encounter (HOSPITAL_COMMUNITY): Payer: Self-pay | Admitting: Physician Assistant

## 2022-03-08 ENCOUNTER — Other Ambulatory Visit (HOSPITAL_COMMUNITY): Payer: Self-pay

## 2022-03-08 VITALS — BP 116/66 | HR 67 | Ht 67.0 in | Wt 206.4 lb

## 2022-03-08 DIAGNOSIS — E785 Hyperlipidemia, unspecified: Secondary | ICD-10-CM | POA: Insufficient documentation

## 2022-03-08 DIAGNOSIS — Z7901 Long term (current) use of anticoagulants: Secondary | ICD-10-CM | POA: Insufficient documentation

## 2022-03-08 DIAGNOSIS — E669 Obesity, unspecified: Secondary | ICD-10-CM | POA: Diagnosis not present

## 2022-03-08 DIAGNOSIS — Z6832 Body mass index (BMI) 32.0-32.9, adult: Secondary | ICD-10-CM | POA: Diagnosis not present

## 2022-03-08 DIAGNOSIS — D6869 Other thrombophilia: Secondary | ICD-10-CM | POA: Diagnosis not present

## 2022-03-08 DIAGNOSIS — Z79899 Other long term (current) drug therapy: Secondary | ICD-10-CM | POA: Diagnosis not present

## 2022-03-08 DIAGNOSIS — I480 Paroxysmal atrial fibrillation: Secondary | ICD-10-CM

## 2022-03-08 DIAGNOSIS — I10 Essential (primary) hypertension: Secondary | ICD-10-CM | POA: Diagnosis not present

## 2022-03-08 DIAGNOSIS — R0683 Snoring: Secondary | ICD-10-CM | POA: Insufficient documentation

## 2022-03-08 DIAGNOSIS — Z8249 Family history of ischemic heart disease and other diseases of the circulatory system: Secondary | ICD-10-CM | POA: Insufficient documentation

## 2022-03-08 MED ORDER — APIXABAN 5 MG PO TABS: ORAL_TABLET | ORAL | 0 refills | Status: DC

## 2022-03-08 MED ORDER — APIXABAN 5 MG PO TABS: ORAL_TABLET | ORAL | 5 refills | Status: DC

## 2022-03-08 NOTE — Progress Notes (Signed)
Primary Care Physician: [name], NP Primary Cardiologist: Dr Harriet Masson Primary Electrophysiologist: Dr Curt Bears Referring Physician: [facility]   Aaron Ruiz is a 66 y.o. male with a history of HTN, HLD, atrial fibrillation who presents for follow up in the [facility]. The patient was initially diagnosed with atrial fibrillation 10/03/20 after presenting to [facility] ED with symptoms of tachypalpitations. He had just seen Dr Harriet Masson who placed a cardiac monitor to screen for arrhythmias. Patient was started on Eliquis for a CHADS2VASC score of 2. He was recently taken off of metoprolol due to bradycardia, this was resumed at the ED. He denies any significant alcohol use but does admit to snoring and some daytime somnolence. Patient was seen by Dr Curt Bears and started on flecainide. He eventually underwent afib ablation 02/08/22.   On follow up today, patient reports that he has done well since his ablation with no episodes of afib. He denies CP, swallowing pain, or groin issues. No bleeding issues on anticoagulation.   Today, he denies symptoms of palpitations, chest pain, shortness of breath, orthopnea, PND, lower extremity edema, dizziness, presyncope, syncope, bleeding, or neurologic sequela. The patient is tolerating medications without difficulties and is otherwise without complaint today.    Atrial Fibrillation Risk Factors:  he does have symptoms or diagnosis of sleep apnea. Patient declined sleep study. he does not have a history of rheumatic fever. he does not have a history of alcohol use. The patient does have a history of early familial atrial fibrillation or other arrhythmias. Sister has afib s/p ablation.  he has a BMI of Body mass index is 32.33 kg/m.[name] Filed Weights   03/08/22 1040  Weight: 93.6 kg    Family History  Problem Relation Age of Onset   Coronary artery disease Father        CABG X5   Diabetes Father     Colon cancer Maternal Aunt    Cancer Brother        lung (smoker)   Hypertension Other    Kidney disease Other      Atrial Fibrillation Management history:  Previous antiarrhythmic drugs: flecainide  Previous cardioversions: none Previous ablations: 02/08/22 CHADS2VASC score: 2 Anticoagulation history: Eliquis   Past Medical History:  Diagnosis Date   Actinic keratosis 03/20/2009   Qualifier: Diagnosis of  By: Shawna Orleans DO, D. 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   Colon polyps    Daytime somnolence 09/21/2020   Diverticulosis    Essential hypertension 02/06/2009   Qualifier: Diagnosis of  By: [name], [name]      Fatigue 09/21/2020   Former smoker 09/21/2020   General medical examination 11/14/2010   GERD 02/06/2009   Qualifier: Diagnosis of  By: [name], [name]     GERD (gastroesophageal reflux disease)    on meds   High cholesterol 02/06/2009   Qualifier: Diagnosis of  By: [name], [name]     Hx of adenomatous colonic polyps 03/07/2009   Hx of eye surgery 2018   "Left Eye was swollen and had procedure to  remove excess swelling"   Hyperglycemia 05/01/2011   Hyperlipidemia    Hypertension    Hypogonadism male 04/30/2011   Lactose intolerance    Lentigo 10/16/2020   Mild intermittent asthma 06/30/2018   Neoplasm of uncertain behavior of skin 10/16/2020   Obesity (BMI 30-39.9) 09/21/2020   Osteoarthritis 09/24/2011   OVERWEIGHT 02/06/2009   Qualifier: Diagnosis of  By: [name]    Palpitations 09/21/2020   Paroxysmal atrial fibrillation ([place]) 10/06/2020   Rash/urticaria 08/07/2012   Recurrent UTI 07/30/2013   Routine general medical  examination at a health care facility 12/16/2011   Secondary hypercoagulable state ([place]) 10/06/2020   SKIN LESION 03/20/2009   Qualifier: Diagnosis of  By: [name]    Snoring 09/21/2020   Unspecified asthma(493.90) 02/06/2009   Centricity Description: ASTHMA Qualifier: Diagnosis of  By: [name], [name]   Centricity Description: ASTHMA, INTERMITTENT, MILD Qualifier: Diagnosis of  By: [name], [name] S    URI (upper respiratory infection) 07/23/2011   Vitamin D deficiency 09/21/2020   Past Surgical History:  Procedure Laterality Date   ATRIAL FIBRILLATION ABLATION N/A 02/08/2022   Procedure: ATRIAL FIBRILLATION ABLATION;  Surgeon: Constance Haw, MD;  Location: [facility];  Service: Cardiovascular;  Laterality: N/A;   CYSTECTOMY  05/2004   removed fromback /sebaceous cyst   INGUINAL HERNIA REPAIR  1995   left   mohs procedure  2017   left side of nose   NASAL SEPTUM SURGERY      Current Outpatient Medications  Medication Sig Dispense Refill   acetaminophen (TYLENOL) 500 MG tablet Take 1 tablet (500 mg total) by mouth every 6 (six) hours as needed. (Patient taking differently: Take 500 mg by mouth in the morning and at bedtime.) 30 tablet 5   albuterol (VENTOLIN HFA) 108 (90 Base) MCG/ACT inhaler Inhale 2 puffs into the lungs every 6 (six) hours as needed for wheezing or shortness of breath. 8 g 0   amLODipine (NORVASC) 10 MG tablet Take 1 tablet (10 mg total) by mouth daily. 90 tablet 1   cetirizine (ZYRTEC) 10 MG tablet Take 10 mg by mouth daily as needed for allergies.     Cholecalciferol (VITAMIN D) 50 MCG (2000 UT) tablet Take 2,000 Units by mouth daily.     diphenhydrAMINE (BENADRYL) 25 MG tablet Take 25 mg by mouth at bedtime.     flecainide (TAMBOCOR) 50 MG tablet TAKE 1 TABLET(50 MG) BY MOUTH TWICE DAILY 180 tablet 2   GLUCOSAMINE-CHONDROITIN PO Take 1 tablet by mouth in the morning and at bedtime.     guaiFENesin (MUCINEX) 600 MG 12 hr tablet Take 600  mg by mouth 2 (two) times daily as needed for cough.     losartan-hydrochlorothiazide (HYZAAR) 100-25 MG tablet Take 1 tablet by mouth daily. 90 tablet 1   metoprolol succinate (TOPROL-XL) 25 MG 24 hr tablet TAKE 1 TABLET(25 MG) BY MOUTH DAILY 90 tablet 1   Omega-3 Fatty Acids (FISH OIL) 1200 MG CAPS Take 1,200 mg by mouth daily.     omeprazole (PRILOSEC) 40 MG capsule TAKE 1 CAPSULE(40 MG) BY MOUTH DAILY 90 capsule 1   pravastatin (PRAVACHOL) 40 MG tablet TAKE 1 TABLET(40 MG) BY MOUTH DAILY 90 tablet 1   sildenafil (REVATIO) 20 MG tablet 1-2 tabs by mouth prior to sexual activity 30 tablet 5   tamsulosin (FLOMAX) 0.4 MG CAPS capsule Take 1 capsule (0.4 mg total) by mouth daily. 90 capsule 1   Turmeric  Curcumin 500 MG CAPS Take 500 mg by mouth daily.     apixaban (ELIQUIS) 5 MG TABS tablet TAKE 1 TABLET(5 MG) BY MOUTH TWICE DAILY 60 tablet 5   No current facility-administered medications for this encounter.    Allergies  Allergen Reactions   Sulfonamide Derivatives     Hallucination (Childhood Reaction)    Social History   Socioeconomic History   Marital status: Married    Spouse name: Not on file   Number of children: Not on file   Years of education: Not on file   Highest education level: Not on file  Occupational History   Not on file  Tobacco Use   Smoking status: Former    Types: Cigarettes   Smokeless tobacco: Never   Tobacco comments:    Former smoker 03/08/22  Vaping Use   Vaping Use: Never used  Substance and Sexual Activity   Alcohol use: No    Alcohol/week: 0.0 standard drinks of alcohol   Drug use: No   Sexual activity: Not on file  Other Topics Concern   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[employer])   Married 69 years    1  daughter 62 - [name] (lives in [place])   1  son 28    Retired   Alcohol use-no   Smoking Status:  quit   Caffeine use/day:  2-3 beverages daily   Social Determinants of Health   Financial  Resource Strain: Low Risk  (05/18/2021)   Overall Financial Resource Strain (CARDIA)    Difficulty of Paying Living Expenses: Not hard at all  Food Insecurity: No Food Insecurity (05/18/2021)   Hunger Vital Sign    Worried About Running Out of Food in the Last Year: Never true    [place] in the Last Year: Never true  Transportation Needs: No Transportation Needs (05/18/2021)   PRAPARE - Hydrologist (Medical): No    Lack of Transportation (Non-Medical): No  Physical Activity: Sufficiently Active (05/18/2021)   Exercise Vital Sign    Days of Exercise per Week: 5 days    Minutes of Exercise per Session: 30 min  Stress: No Stress Concern Present (05/18/2021)   [place]    Feeling of Stress : Not at all  Social Connections: Moderately Integrated (05/18/2021)   Social Connection and Isolation Panel [NHANES]    Frequency of Communication with Friends and Family: More than three times a week    Frequency of Social Gatherings with Friends and Family: More than three times a week    Attends Religious Services: More than 4 times per year    Active Member of [employer] or Organizations: No    Attends Archivist Meetings: Never    Marital Status: Married  Human resources officer Violence: Not At Risk (05/18/2021)   Humiliation, Afraid, Rape, and Kick questionnaire    Fear of Current or Ex-Partner: No    Emotionally Abused: No    Physically Abused: No    Sexually Abused: No     ROS- All systems are reviewed and negative except as per the HPI above.  Physical Exam: Vitals:   03/08/22 1040  BP: 116/66  Pulse: 67  Weight: 93.6 kg  Height: '5\' 7"'$  (1.702 m)     GEN- The patient is a well appearing obese male, alert and oriented x 3 today.   HEENT-head normocephalic,  atraumatic, sclera clear, conjunctiva pink, hearing intact, trachea midline. Lungs- Clear to ausculation bilaterally,  normal work of breathing Heart- Regular rate and rhythm, no murmurs, rubs or gallops  GI- soft, NT, ND, + BS Extremities- no clubbing, cyanosis, or edema MS- no significant deformity or atrophy Skin- no rash or lesion Psych- euthymic mood, full affect Neuro- strength and sensation are intact   Wt Readings from Last 3 Encounters:  03/08/22 93.6 kg  02/08/22 90.7 kg  12/10/21 90.7 kg    EKG today demonstrates  SR Vent. rate 67 BPM PR interval 174 ms QRS duration 92 ms QT/QTcB 388/409 ms  Echo 11/01/20 1. Left ventricular ejection fraction, by estimation, is 60 to 65%. The  left ventricle has normal function. The left ventricle has no regional  wall motion abnormalities. Left ventricular diastolic parameters were  normal.   2. Right ventricular systolic function is normal. The right ventricular  size is normal.   3. The mitral valve is normal in structure. Trivial mitral valve  regurgitation. No evidence of mitral stenosis.   4. The aortic valve is normal in structure. Aortic valve regurgitation is  not visualized. No aortic stenosis is present.   5. The inferior vena cava is normal in size with greater than 50%  respiratory variability, suggesting right atrial pressure of 3 mmHg.   Epic records are reviewed at length today  CHA2DS2-VASc Score = 2  The patient's score is based upon: CHF History: 0 HTN History: 1 Diabetes History: 0 Stroke History: 0 Vascular Disease History: 0 Age Score: 1 Gender Score: 0        ASSESSMENT AND PLAN: 1. Paroxysmal Atrial Fibrillation (ICD10:  I48.0) The patient's CHA2DS2-VASc score is 2, indicating a 2.2% annual risk of stroke.   S/p afib ablation 02/08/22 Patient appears to be maintaining SR.  Continue flecainide 50 mg BID, hopefully this will be short term post ablation.  Continue Eliquis 5 mg BID with no missed doses for 3 months post ablation. Continue Toprol 25 mg daily  2. Secondary Hypercoagulable State (ICD10:   D68.69) The patient is at significant risk for stroke/thromboembolism based upon his CHA2DS2-VASc Score of 2.  Continue Apixaban (Eliquis).   3. Obesity Body mass index is 32.33 kg/m. Lifestyle modification was discussed and encouraged including regular physical activity and weight reduction.  4. HTN Stable, no changes today.   Follow up with Dr Curt Bears as scheduled.    [facility] [address] [place], [place] [address] [phone] 03/08/2022 10:56 AM

## 2022-04-22 ENCOUNTER — Other Ambulatory Visit: Payer: Self-pay | Admitting: Cardiology

## 2022-04-22 DIAGNOSIS — I480 Paroxysmal atrial fibrillation: Secondary | ICD-10-CM

## 2022-04-22 NOTE — Telephone Encounter (Signed)
Prescription refill request for Eliquis received. Indication:Afib  Last office visit: 03/08/22 Marlene Lard) Scr: 0.78 (01/28/22) Age: 66 Weight: 93.6kg  Appropriate dose and refill sent to requested pharmacy.

## 2022-05-08 DIAGNOSIS — H00011 Hordeolum externum right upper eyelid: Secondary | ICD-10-CM | POA: Diagnosis not present

## 2022-05-13 ENCOUNTER — Ambulatory Visit: Payer: Medicare Other | Attending: Cardiology | Admitting: Cardiology

## 2022-05-13 ENCOUNTER — Encounter: Payer: Self-pay | Admitting: Cardiology

## 2022-05-13 VITALS — BP 112/68 | HR 67 | Ht 67.0 in | Wt 206.0 lb

## 2022-05-13 DIAGNOSIS — D6869 Other thrombophilia: Secondary | ICD-10-CM

## 2022-05-13 DIAGNOSIS — I480 Paroxysmal atrial fibrillation: Secondary | ICD-10-CM | POA: Diagnosis not present

## 2022-05-13 NOTE — Progress Notes (Signed)
Electrophysiology Office Note   Date:  05/13/2022   ID:  Aaron Ruiz, DOB 24-May-1956, MRN [id number]  PCP:  [name], NP  Cardiologist:  [name] Primary Electrophysiologist:  Jobin Montelongo Meredith Leeds, MD    Chief Complaint: AF   History of Present Illness: Aaron Ruiz is a 66 y.o. male who is being seen today for the evaluation of AF at the request of [name], NP. Presenting today for electrophysiology evaluation.  Has a history seen for hypertension, hyperlipidemia, atrial fibrillation.  He was diagnosed with atrial fibrillation in May 2022.  He wore a cardiac monitor that showed continued episodes of atrial fibrillation with weakness and fatigue and was started on flecainide.  He is now status post ablation 02/08/2022.  Today, denies symptoms of palpitations, chest pain, shortness of breath, orthopnea, PND, lower extremity edema, claudication, dizziness, presyncope, syncope, bleeding, or neurologic sequela. The patient is tolerating medications without difficulties.  Ablation he has done well.  He has had 1 further episode of atrial fibrillation that lasted up to 20 minutes.  Aside from that, he has no major complaints.  He has doing the [facility].  He is planning on starting exercising.   Past Medical History:  Diagnosis Date   Actinic keratosis 03/20/2009   Qualifier: Diagnosis of  By: Shawna Orleans DO, D. 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   Colon polyps    Daytime somnolence 09/21/2020   Diverticulosis    Essential hypertension 02/06/2009   Qualifier: Diagnosis of  By: [name], [name]      Fatigue 09/21/2020   Former smoker  09/21/2020   General medical examination 11/14/2010   GERD 02/06/2009   Qualifier: Diagnosis of  By: [name], [name]     GERD (gastroesophageal reflux disease)    on meds   High cholesterol 02/06/2009   Qualifier: Diagnosis of  By: [name], [name]     Hx of adenomatous colonic polyps 03/07/2009   Hx of eye surgery 2018   "Left Eye was swollen and had procedure to remove excess swelling"   Hyperglycemia 05/01/2011   Hyperlipidemia    Hypertension    Hypogonadism male 04/30/2011   Lactose intolerance    Lentigo 10/16/2020   Mild intermittent asthma 06/30/2018   Neoplasm of uncertain behavior of skin 10/16/2020   Obesity (BMI 30-39.9) 09/21/2020   Osteoarthritis 09/24/2011   OVERWEIGHT 02/06/2009   Qualifier: Diagnosis of  By: [name]    Palpitations 09/21/2020   Paroxysmal atrial fibrillation ([place]) 10/06/2020   Rash/urticaria 08/07/2012   Recurrent UTI 07/30/2013   Routine general medical examination at a health care facility 12/16/2011   Secondary hypercoagulable state ([place]) 10/06/2020   SKIN LESION 03/20/2009   Qualifier: Diagnosis of  By: [name]    Snoring 09/21/2020   Unspecified asthma(493.90) 02/06/2009   Centricity Description: ASTHMA Qualifier: Diagnosis of  By: [name], [name]   Centricity Description: ASTHMA, INTERMITTENT, MILD Qualifier: Diagnosis of  By: [name], [name] S    URI (upper respiratory infection) 07/23/2011   Vitamin D deficiency 09/21/2020  Past Surgical History:  Procedure Laterality Date   ATRIAL FIBRILLATION ABLATION N/A 02/08/2022   Procedure: ATRIAL FIBRILLATION ABLATION;  Surgeon: Constance Haw, MD;  Location: [facility];  Service: Cardiovascular;  Laterality: N/A;   CYSTECTOMY  05/2004   removed fromback /sebaceous cyst   INGUINAL HERNIA REPAIR  1995   left   mohs procedure  2017   left side of nose   NASAL SEPTUM SURGERY       Current Outpatient Medications  Medication Sig Dispense Refill   acetaminophen  (TYLENOL) 500 MG tablet Take 1 tablet (500 mg total) by mouth every 6 (six) hours as needed. (Patient taking differently: Take 500 mg by mouth in the morning and at bedtime.) 30 tablet 5   albuterol (VENTOLIN HFA) 108 (90 Base) MCG/ACT inhaler Inhale 2 puffs into the lungs every 6 (six) hours as needed for wheezing or shortness of breath. 8 g 0   amLODipine (NORVASC) 10 MG tablet Take 1 tablet (10 mg total) by mouth daily. 90 tablet 1   amoxicillin-clavulanate (AUGMENTIN) 875-125 MG tablet Take 1 tablet by mouth 2 (two) times daily.     apixaban (ELIQUIS) 5 MG TABS tablet TAKE 1 TABLET(5 MG) BY MOUTH TWICE DAILY 60 tablet 5   cetirizine (ZYRTEC) 10 MG tablet Take 10 mg by mouth daily as needed for allergies.     Cholecalciferol (VITAMIN D) 50 MCG (2000 UT) tablet Take 2,000 Units by mouth daily.     diphenhydrAMINE (BENADRYL) 25 MG tablet Take 25 mg by mouth at bedtime.     GLUCOSAMINE-CHONDROITIN PO Take 1 tablet by mouth in the morning and at bedtime.     guaiFENesin (MUCINEX) 600 MG 12 hr tablet Take 600 mg by mouth 2 (two) times daily as needed for cough.     losartan-hydrochlorothiazide (HYZAAR) 100-25 MG tablet Take 1 tablet by mouth daily. 90 tablet 1   metoprolol succinate (TOPROL-XL) 25 MG 24 hr tablet TAKE 1 TABLET(25 MG) BY MOUTH DAILY 90 tablet 1   Omega-3 Fatty Acids (FISH OIL) 1200 MG CAPS Take 1,200 mg by mouth daily.     omeprazole (PRILOSEC) 40 MG capsule TAKE 1 CAPSULE(40 MG) BY MOUTH DAILY 90 capsule 1   pravastatin (PRAVACHOL) 40 MG tablet TAKE 1 TABLET(40 MG) BY MOUTH DAILY 90 tablet 1   sildenafil (REVATIO) 20 MG tablet 1-2 tabs by mouth prior to sexual activity 30 tablet 5   tamsulosin (FLOMAX) 0.4 MG CAPS capsule Take 1 capsule (0.4 mg total) by mouth daily. 90 capsule 1   Turmeric Curcumin 500 MG CAPS Take 500 mg by mouth daily.     No current facility-administered medications for this visit.    Allergies:   Sulfonamide derivatives   Social History:  The patient   reports that he has quit smoking. His smoking use included cigarettes. He has never used smokeless tobacco. He reports that he does not drink alcohol and does not use drugs.   Family History:  The patient's family history includes Cancer in his brother; Colon cancer in his maternal aunt; Coronary artery disease in his father; Diabetes in his father; Hypertension in an other family member; Kidney disease in an other family member.   ROS:  Please see the history of present illness.   Otherwise, review of systems is positive for none.   All other systems are reviewed and negative.   PHYSICAL EXAM: VS:  BP 112/68   Pulse 67   Ht '5\' 7"'$  (1.702 m)  Wt 206 lb (93.4 kg)   SpO2 94%   BMI 32.26 kg/m  , BMI Body mass index is 32.26 kg/m. GEN: Well nourished, well developed, in no acute distress  HEENT: normal  Neck: no JVD, carotid bruits, or masses Cardiac: RRR; no murmurs, rubs, or gallops,no edema  Respiratory:  clear to auscultation bilaterally, normal work of breathing GI: soft, nontender, nondistended, + BS MS: no deformity or atrophy  Skin: warm and dry Neuro:  Strength and sensation are intact Psych: euthymic mood, full affect  EKG:  EKG is ordered today. Personal review of the ekg ordered shows sinus rhythm   Recent Labs: 12/10/2021: ALT 15 01/28/2022: BUN 16; Creatinine, Ser 0.78; Hemoglobin 13.4; Platelets 142; Potassium 3.7; Sodium 138    Lipid Panel     Component Value Date/Time   CHOL 171 12/10/2021 1212   TRIG 200.0 (H) 12/10/2021 1212   HDL 39.50 12/10/2021 1212   CHOLHDL 4 12/10/2021 1212   VLDL 40.0 12/10/2021 1212   LDLCALC 92 12/10/2021 1212   LDLDIRECT 116.0 09/11/2020 1232     Wt Readings from Last 3 Encounters:  05/13/22 206 lb (93.4 kg)  03/08/22 206 lb 6.4 oz (93.6 kg)  02/08/22 200 lb (90.7 kg)      Other studies Reviewed: Additional studies/ records that were reviewed today include: TTE 11/01/20  Review of the above records today demonstrates:   1.  Left ventricular ejection fraction, by estimation, is 60 to 65%. The  left ventricle has normal function. The left ventricle has no regional  wall motion abnormalities. Left ventricular diastolic parameters were  normal.   2. Right ventricular systolic function is normal. The right ventricular  size is normal.   3. The mitral valve is normal in structure. Trivial mitral valve  regurgitation. No evidence of mitral stenosis.   4. The aortic valve is normal in it this point, he is not quite ready for ablation structure. Aortic valve regurgitation is  not visualized. No aortic stenosis is present.   5. The inferior vena cava is normal in size with greater than 50%  respiratory variability, suggesting right atrial pressure of 3 mmHg.   Cardiac monitor 10/11/2020 personally reviewed This study is remarkable for symptomatic atrial fibrillation/atrial flutter and paroxysmal atrial tachycardia.  ASSESSMENT AND PLAN:  .  Paroxysmal atrial fibrillation: Currently on Eliquis 5 mg twice daily, flecainide 50 mg daily, Toprol-XL 25 mg daily.  CHA2DS2-VASc of 2.  Status post ablation 02/08/2022.  Has had 1 episode of atrial fibrillation but otherwise well controlled.  We Aaron Ruiz stop flecainide today.  2.  Secondary hypercoagulable state: Currently on Eliquis for atrial fibrillation as above  3.  Hypertension:  4.  Obesity: Diet and exercise encouraged.  He has joined the [employer]. Body mass index is 32.26 kg/m.   Current medicines are reviewed at length with the patient today.   The patient does not have concerns regarding his medicines.  The following changes were made today: None  Labs/ tests ordered today include:  Orders Placed This Encounter  Procedures   EKG 12-Lead      Disposition:   FU with Aaron Ruiz 6 months  Signed, Thorsten Climer Meredith Leeds, MD  05/13/2022 1:58 PM     [address] [place] [address] [phone] (office) [phone]  (fax)

## 2022-05-13 NOTE — Patient Instructions (Addendum)
Medication Instructions:  Your physician has recommended you make the following change in your medication:  STOP Flecainide  *If you need a refill on your cardiac medications before your next appointment, please call your pharmacy*   Lab Work: None ordered   Testing/Procedures: None ordered   Follow-Up: At [facility], you and your health needs are our priority.  As part of our continuing mission to provide you with exceptional heart care, we have created designated Provider Care Teams.  These Care Teams include your primary Cardiologist (physician) and Advanced Practice Providers (APPs -  Physician Assistants and Nurse Practitioners) who all work together to provide you with the care you need, when you need it.  Your next appointment:   6 month(s)  The format for your next appointment:   In Person  Provider:   Allegra Lai, MD    Thank you for choosing [facility]!!   Trinidad Curet, RN [phone]  Other Instructions   Important Information About Sugar

## 2022-05-28 ENCOUNTER — Encounter: Payer: Self-pay | Admitting: Family Medicine

## 2022-05-28 ENCOUNTER — Telehealth: Payer: Self-pay | Admitting: *Deleted

## 2022-05-28 ENCOUNTER — Telehealth (INDEPENDENT_AMBULATORY_CARE_PROVIDER_SITE_OTHER): Payer: Medicare Other | Admitting: Family Medicine

## 2022-05-28 VITALS — Ht 67.0 in | Wt 201.5 lb

## 2022-05-28 DIAGNOSIS — Z0000 Encounter for general adult medical examination without abnormal findings: Secondary | ICD-10-CM | POA: Diagnosis not present

## 2022-05-28 NOTE — Progress Notes (Signed)
PATIENT CHECK-IN and HEALTH RISK ASSESSMENT QUESTIONNAIRE:  -completed by phone/video for upcoming Medicare Preventive Visit  Pre-Visit Check-in: 1)Vitals (height, wt, BP, etc) - record in vitals section for visit on day of visit 2)Review and Update Medications, Allergies PMH, Surgeries, Social history in Epic 3)Hospitalizations in the last year with date/reason? No  4)Review and Update Care Team (patient's specialists) in Epic 5) Complete PHQ9 in Epic  6) Complete Fall Screening in Epic 7)Review all Health Maintenance Due and order under PCP if not done.  Medicare Wellness Patient Questionnaire:  Answer theses question about your habits: Do you drink alcohol? No  Have you ever smoked? Yes Quit date if applicable? 1976  How many packs a day do/did you smoke? Less than a pack a day Do you use smokeless tobacco? No Do you use an illicit drugs? No Do you exercises? Yes IF so, what type and how many days/minutes per week? 5 times a week, joined the [facility] - some lifting, walking the track, sit down elliptical (does 30 mins walking, then 2 miles on elliptical and then some lifting) Are you sexually active? Yes Number of partners?1 Typical breakfast: Bagel, scrambled eggs with bacon or sausage Typical lunch: Sandwich, Lance crackers & banana Typical dinner: Meat, vegetables and some potatoes and salad, doesn't eat our much  Typical snacks: Fruit, cashews, peanut butter pretzels  Beverages: Coffee, ice tea, diet soda  Answer theses question about you: Can you perform most household chores? Yes Do you find it hard to follow a conversation in a noisy room? Yes, wears hearing aids. Do you often ask people to speak up or repeat themselves? No Do you feel that you have a problem with memory? No Do you balance your checkbook and or bank acounts? No, wife does Do you feel safe at home? Yes Last dentist visit? He is not sure, not upto date Do you need assistance with any of the following: Please  note if so   Driving? No  Feeding yourself? No  Getting from bed to chair? No   Getting to the toilet? No  Bathing or showering? No  Managing money? No  Climbing a flight of stairs? No  Preparing meals? No, he does the cooking    Do you have Advanced Directives in place (Living Will, Healthcare Power or [name])? Yes    Last eye Exam and location? 09/2021, [facility] in [place]   Do you currently use prescribed or non-prescribed narcotic or opioid pain medications? No  Do you have a history or close family history of breast, ovarian, tubal or peritoneal cancer or a family member with BRCA (breast cancer susceptibility 1 and 2) gene mutations? No  Nurse/Assistant Credentials/time stamp: 05/28/2022 at 4:18 pm, I acted as a Education administrator for Aaron Ruiz / Aaron Pickler, LPN     ----------------------------------------------------------------------------------------------------------------------------------------------------------------------------------------------------------------------    [place] (Welcome to [facility], initial annual wellness or annual wellness exam)  Virtual Visit via Video Note  I connected with Aaron Ruiz on  by a video enabled telemedicine application and verified that I am speaking with the correct person using two identifiers.  Location patient: home Location provider:work or home office Persons participating in the virtual visit: patient, provider  Concerns and/or follow up today: none. Deals with chronic OA hips/knees - improving with exercise. Has A. Fib. Had ablation and is doing well since.    See HM section in Epic for other details of completed HM.    ROS: negative for  report of fevers, unintentional weight loss, vision changes, vision loss, hearing loss or change, chest pain, sob, hemoptysis, melena, hematochezia, hematuria, genital discharge or lesions, falls, bleeding or bruising, loc, thoughts of  suicide or self harm, memory loss  Patient-completed extensive health risk assessment - reviewed and discussed with the patient: See Health Risk Assessment completed with patient prior to the visit either above or in recent phone note. This was reviewed in detailed with the patient today and appropriate recommendations, orders and referrals were placed as needed per Summary below and patient instructions.   Review of Medical History: -PMH, PSH, Family History and current specialty and care providers reviewed and updated and listed below   Patient Care Team: [name], NP as PCP - General (Internal Medicine) Berniece Salines, DO as PCP - Cardiology (Cardiology) Constance Haw, MD as PCP - Electrophysiology (Cardiology)   Past Medical History:  Diagnosis Date   Actinic keratosis 03/20/2009   Qualifier: Diagnosis of  By: [name]    Allergic conjunctivitis 06/30/2018   Anemia    ANEMIA-NOS 02/06/2009   Qualifier: Diagnosis of  By: [name], [name]     Asthma    uses inhaler   Basal cell carcinoma 2017   left side of nose   Basal cell carcinoma of nose 10/16/2020   Borderline diabetes 05/03/2011   BPH (benign prostatic hyperplasia) 07/29/2014   Chemosis of conjunctiva of both eyes 01/15/2017   Colon polyps    Daytime somnolence 09/21/2020   Diverticulosis    Essential hypertension 02/06/2009   Qualifier: Diagnosis of  By: [name], [name]      Fatigue 09/21/2020   Former smoker 09/21/2020   General medical examination 11/14/2010   GERD 02/06/2009   Qualifier: Diagnosis of  By: [name], [name]     GERD (gastroesophageal reflux disease)    on meds   High cholesterol 02/06/2009   Qualifier: Diagnosis of  By: [name], [name]     Hx of adenomatous colonic polyps 03/07/2009   Hx of eye surgery 2018   "Left Eye was swollen and had procedure to remove excess swelling"   Hyperglycemia 05/01/2011   Hyperlipidemia    Hypertension    Hypogonadism male 04/30/2011    Lactose intolerance    Lentigo 10/16/2020   Mild intermittent asthma 06/30/2018   Neoplasm of uncertain behavior of skin 10/16/2020   Obesity (BMI 30-39.9) 09/21/2020   Osteoarthritis 09/24/2011   OVERWEIGHT 02/06/2009   Qualifier: Diagnosis of  By: [name]    Palpitations 09/21/2020   Paroxysmal atrial fibrillation ([place]) 10/06/2020   Rash/urticaria 08/07/2012   Recurrent UTI 07/30/2013   Routine general medical examination at a health care facility 12/16/2011   Secondary hypercoagulable state ([place]) 10/06/2020   SKIN LESION 03/20/2009   Qualifier: Diagnosis of  By: [name]    Snoring 09/21/2020   Unspecified asthma(493.90) 02/06/2009   Centricity Description: ASTHMA Qualifier: Diagnosis of  By: [name], [name]   Centricity Description: ASTHMA, INTERMITTENT, MILD Qualifier: Diagnosis of  By: [name], [name] S    URI (upper respiratory infection) 07/23/2011   Vitamin D deficiency 09/21/2020    Past Surgical History:  Procedure Laterality Date   ATRIAL FIBRILLATION ABLATION N/A 02/08/2022   Procedure: ATRIAL FIBRILLATION ABLATION;  Surgeon: Constance Haw, MD;  Location: [place] [facility];  Service: Cardiovascular;  Laterality: N/A;   CYSTECTOMY  05/2004   removed fromback /sebaceous cyst   INGUINAL HERNIA REPAIR  1995   left   mohs procedure  2017   left side of nose   NASAL SEPTUM SURGERY      Social History   Socioeconomic History   Marital status: Married    Spouse name: Not on file   Number of children: Not on file   Years of education: Not on file   Highest education level: Not on file  Occupational History   Not on file  Tobacco Use   Smoking status: Former    Types: Cigarettes   Smokeless tobacco: Never   Tobacco comments:    Former smoker 03/08/22  Vaping Use   Vaping Use: Never used  Substance and Sexual Activity   Alcohol use: No    Alcohol/week: 0.0 standard drinks of alcohol   Drug use: No   Sexual activity: Not on file  Other  Topics Concern   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[employer])   Married 47 years    1  daughter 51 - [name] (lives in [place])   1  son 58    Retired   Alcohol use-no   Smoking Status:  quit   Caffeine use/day:  2-3 beverages daily   Social Determinants of Health   Financial Resource Strain: Low Risk  (05/18/2021)   Overall Financial Resource Strain (CARDIA)    Difficulty of Paying Living Expenses: Not hard at all  Food Insecurity: No Food Insecurity (05/18/2021)   Hunger Vital Sign    Worried About Running Out of Food in the Last Year: Never true    [place] in the Last Year: Never true  Transportation Needs: No Transportation Needs (05/18/2021)   PRAPARE - Hydrologist (Medical): No    Lack of Transportation (Non-Medical): No  Physical Activity: Sufficiently Active (05/18/2021)   Exercise Vital Sign    Days of Exercise per Week: 5 days    Minutes of Exercise per Session: 30 min  Stress: No Stress Concern Present (05/18/2021)   [place]    Feeling of Stress : Not at all  Social Connections: Moderately Integrated (05/18/2021)   Social Connection and Isolation Panel [NHANES]    Frequency of Communication with Friends and Family: More than three times a week    Frequency of Social Gatherings with Friends and Family: More than three times a week    Attends Religious Services: More than 4 times per year    Active Member of [employer] or Organizations: No    Attends Archivist Meetings: Never    Marital Status: Married  Human resources officer Violence: Not At Risk (05/18/2021)   Humiliation, Afraid, Rape, and Kick questionnaire    Fear of Current or Ex-Partner: No    Emotionally Abused: No    Physically Abused: No    Sexually Abused: No    Family History  Problem Relation Age of Onset   Coronary artery disease Father        CABG X5    Diabetes Father    Colon cancer Maternal Aunt    Cancer Brother        lung (smoker)   Hypertension Other    Kidney disease Other     Current Outpatient Medications on File Prior to Visit  Medication Sig Dispense Refill   acetaminophen (TYLENOL) 500 MG tablet Take 1 tablet (500 mg total) by mouth every 6 (six) hours as  needed. (Patient taking differently: Take 500 mg by mouth in the morning and at bedtime.) 30 tablet 5   albuterol (VENTOLIN HFA) 108 (90 Base) MCG/ACT inhaler Inhale 2 puffs into the lungs every 6 (six) hours as needed for wheezing or shortness of breath. 8 g 0   amLODipine (NORVASC) 10 MG tablet Take 1 tablet (10 mg total) by mouth daily. 90 tablet 1   apixaban (ELIQUIS) 5 MG TABS tablet TAKE 1 TABLET(5 MG) BY MOUTH TWICE DAILY 60 tablet 5   cetirizine (ZYRTEC) 10 MG tablet Take 10 mg by mouth daily as needed for allergies.     Cholecalciferol (VITAMIN D) 50 MCG (2000 UT) tablet Take 2,000 Units by mouth daily.     diphenhydrAMINE (BENADRYL) 25 MG tablet Take 25 mg by mouth at bedtime.     GLUCOSAMINE-CHONDROITIN PO Take 1 tablet by mouth in the morning and at bedtime.     guaiFENesin (MUCINEX) 600 MG 12 hr tablet Take 600 mg by mouth at bedtime.     losartan-hydrochlorothiazide (HYZAAR) 100-25 MG tablet Take 1 tablet by mouth daily. 90 tablet 1   metoprolol succinate (TOPROL-XL) 25 MG 24 hr tablet TAKE 1 TABLET(25 MG) BY MOUTH DAILY 90 tablet 1   Omega-3 Fatty Acids (FISH OIL) 1200 MG CAPS Take 1,200 mg by mouth daily.     omeprazole (PRILOSEC) 40 MG capsule TAKE 1 CAPSULE(40 MG) BY MOUTH DAILY 90 capsule 1   pravastatin (PRAVACHOL) 40 MG tablet TAKE 1 TABLET(40 MG) BY MOUTH DAILY 90 tablet 1   sildenafil (REVATIO) 20 MG tablet 1-2 tabs by mouth prior to sexual activity 30 tablet 5   tamsulosin (FLOMAX) 0.4 MG CAPS capsule Take 1 capsule (0.4 mg total) by mouth daily. 90 capsule 1   Turmeric Curcumin 500 MG CAPS Take 500 mg by mouth daily.     No current  facility-administered medications on file prior to visit.    Allergies  Allergen Reactions   Sulfonamide Derivatives     Hallucination (Childhood Reaction)       Physical Exam There were no vitals filed for this visit. Estimated body mass index is 31.56 kg/m as calculated from the following:   Height as of this encounter: 5' 7" (1.702 m).   Weight as of this encounter: 201 lb 8 oz (91.4 kg).  EKG (optional): deferred due to virtual visit  GENERAL: alert, oriented, appears well and in no acute distress; full vision exam deferred due to pandemic and/or virtual encounter  HEENT: atraumatic, conjunttiva clear, no obvious abnormalities on inspection of external nose and ears  NECK: normal movements of the head and neck  LUNGS: on inspection no signs of respiratory distress, breathing rate appears normal, no obvious gross SOB, gasping or wheezing  CV: no obvious cyanosis  MS: moves all visible extremities without noticeable abnormality  PSYCH/NEURO: pleasant and cooperative, no obvious depression or anxiety, speech and thought processing grossly intact, Cognitive function grossly intact  Flowsheet Row Office Visit from 12/05/2017 in [name] at [facility]  PHQ-9 Total Score 1           05/28/2022    4:07 PM 12/10/2021   11:39 AM 05/18/2021    9:57 AM 09/11/2020   12:00 PM 12/05/2017   11:52 AM  Depression screen PHQ 2/9  Decreased Interest 0 0 0 0 0  Down, Depressed, Hopeless 0 0 0 0 0  PHQ - 2 Score 0 0 0 0 0  Altered sleeping  0  Tired, decreased energy     1  Change in appetite     0  Feeling bad or failure about yourself      0  Trouble concentrating     0  Moving slowly or fidgety/restless     0  Suicidal thoughts     0  PHQ-9 Score     1  Difficult doing work/chores  Not difficult at all          05/18/2021    9:55 AM 12/10/2021   11:40 AM 02/08/2022    9:19 AM 05/24/2022   10:04 AM 05/28/2022    4:07 PM  Fall Risk  Falls  in the past year? 0 0  0 1  Was there an injury with Fall? 0 0  0 0  Fall Risk Category Calculator 0 0   2  Fall Risk Category Low Low   Moderate  Patient Fall Risk Level Low fall risk Low fall risk Low fall risk    Patient at Risk for Falls Due to     Impaired balance/gait  Fall risk Follow up Falls prevention discussed    Falls evaluation completed     SUMMARY AND PLAN:  Medicare annual wellness visit, subsequent   Discussed applicable health maintenance/preventive health measures and advised and referred or ordered per patient preferences:  Health Maintenance  Topic Date Due   Medicare Annual Wellness (AWV)  05/29/2023   COLONOSCOPY (Pts 45-37yr Insurance coverage will need to be confirmed)  10/26/2024   Pneumonia Vaccine 66 Years old  Completed   INFLUENZA VACCINE  Completed   Hepatitis C Screening  Completed   Zoster Vaccines- Shingrix  Completed   HPV VACCINES  Aged Out   COVID-19 Vaccine  Discontinued  Reports had first two covid vaccines but had not desired further. We did discussed new options available.  Discussed prostate cancer screening and risks/benefits. He plans to discuss further with PCP, did in the past several times, but skipped with last labs.  Does labs with PCP.   Education and counseling on the following was provided based on the above review of health and a plan/checklist for the patient, along with additional information discussed, was provided for the patient in the patient instructions :    -Advised and counseled on maintaining healthy weight and healthy lifestyle - including the importance of a health diet, regular physical activity, social connections and stress management. -Advised and counseled on a whole foods based healthy diet and regular exercise: discussed a heart healthy whole foods based diet at length. A summary of a healthy diet was provided in the Patient Instructions. -congratulated on regular exercise and reviewed guidelines for  exercise in adults.  -Balance exercises with recs on how to do safely provided. Can greatly reduce falls.  -Advise yearly dental visits at minimum and regular eye exams   Follow up: see patient instructions   Patient Instructions  I really enjoyed getting to talk with you today! I am available on Tuesdays and Thursdays for virtual visits if you have any questions or concerns, or if I can be of any further assistance.   CHECKLIST FROM ANNUAL WELLNESS VISIT:  -Follow up (please call to schedule if not scheduled after visit):   -yearly for annual wellness visit with primary care office  Here is a list of your preventive care/health maintenance measures and the plan for each if any are due:  Health Maintenance  Topic Date Due   INFLUENZA VACCINE  01/29/2022   Medicare Annual Wellness (AWV)  Done today   COLONOSCOPY (Pts 45-79yr Insurance coverage will need to be confirmed)  10/26/2024   Pneumonia Vaccine 66 Years old  Completed   Hepatitis C Screening  Completed   Zoster Vaccines- Shingrix  Completed   HPV VACCINES  Aged Out   COVID-19 Vaccine  There are several new options available since this fall.   -consider prostate cancer screening with next set of labs if desired. Discuss with your Primary Care Provider.   -See a dentist at least yearly  -Get your eyes  exams per your eye specialist's recommendations  -I have included below further information regarding a healthy whole foods based diet, physical activity guidelines for adults, stress management and opportunities for social connections. I hope you find this information useful.   -----------------------------------------------------------------------------------------------------------------------------------------------------------------------------------------------------------------------------------------------------------  FOOD - THE FUEL FOR A HAPPY HEALTHY LIFE: -eat real food: lots of colorful vegetables (half the  plate) -5-7 servings of vegetables and fruits per day (fresh or steamed is best), exp. 2 servings of vegetables with lunch and dinner and 2 servings of fruit per day. Berries and greens such as kale and collards are great choices.  -consume on a regular basis: whole grains (make sure first ingredient on label contains the word "whole"), fresh fruits, fish, nuts, seeds, healthy oils (such as olive oil, avocado oil, grape seed oil) -may eat small amounts of dairy and lean meat on occasion, but avoid processed meats such as ham, bacon, lunch meat, etc. -drink water -try to avoid fast food and pre-packaged foods, processed meat -try to avoid foods that contain any ingredients with names you do not recognize  -try to avoid sugar/sweets (except for the natural sugar that occurs in fresh fruit) -try to avoid sweet drinks -try to avoid white rice, white bread, pasta (unless whole grain), white or yellow potatoes  MOVE - the key to keeping your body moving and working best: -if you wish to increase your physical activity, do so gradually and with your doctor's approval  -move and stretch your body, legs, feet and arms when sitting for long periods -Physical activity guidelines for optimal health in adults: -least 150 minutes per week of aerobic exercise (can talk, but not sing) once approved by your doctor, 20-30 minutes of sustained activity or two 10 minute episodes of sustained activity every day.  -resistance training at least 2 days per week if approved by your doctor -balance exercises 3+ days per week:   Stand somewhere where you have something sturdy to hold onto if you lose balance.    1) lift up on toes, start with 5x per day and work up to 20x   2) stand and lift on leg straight out to the side so that foot is a few inches of the floor, start with 5x each side and work up to 20x each side   3) stand on one foot, start with 5 seconds each side and work up to 20 seconds on each side  If you  need ideas or help with getting more active:  -Silver sneakers https://tools.silversneakers.com  -Walk with a Doc: Hhttp://stephens-thompson.biz/ -try to include resistance (weight lifting/strength building) and balance exercises twice per week: or the following link for ideas: hChessContest.fr hUpdateClothing.com.cy STRESS MANAGEMENT - so important for health and well being -try meditating, or just sitting quietly with deep breathing while intentionally relaxing all parts of your body for 5 minutes daily  SOCIAL CONNECTIONS: -options in [place]  you wish to engage in more social and exercise related activities:  -Silver sneakers https://tools.silversneakers.com  -Walk with a Doc: http://stephens-thompson.biz/  -Check out the [place] 50+ section on the [place] [employer] (hiking clubs, book clubs, cards and games, chess, exercise classes, aquatic classes and much more) - see the website for details: https://www.[place].gov/departments/parks-recreation/active-adults50  -YouTube has lots of exercise videos for different ages and abilities as well  -[place] (a variety of indoor and outdoor inperson activities for adults). 330-769-2316. [address]  -Virtual Online Classes (a variety of topics): see seniorplanet.org or call (848)849-7393  -consider volunteering at a school, hospice center, church, senior center or elsewhere   -try meditating, or just sitting quietly with deep breathing while intentionally relaxing all parts of your body for 5 minutes daily              Lucretia Kern, DO

## 2022-05-28 NOTE — Telephone Encounter (Signed)
Pt called back and spoke to him for AWV with dr. Maudie Mercury.

## 2022-05-28 NOTE — Telephone Encounter (Signed)
Left message on voicemail to call office.  

## 2022-05-28 NOTE — Patient Instructions (Addendum)
I really enjoyed getting to talk with you today! I am available on Tuesdays and Thursdays for virtual visits if you have any questions or concerns, or if I can be of any further assistance.   CHECKLIST FROM ANNUAL WELLNESS VISIT:  -Follow up (please call to schedule if not scheduled after visit):   -yearly for annual wellness visit with primary care office  Here is a list of your preventive care/health maintenance measures and the plan for each if any are due:  Health Maintenance  Topic Date Due   INFLUENZA VACCINE  01/29/2022   Medicare Annual Wellness (AWV)  Done today   COLONOSCOPY (Pts 45-36yr Insurance coverage will need to be confirmed)  10/26/2024   Pneumonia Vaccine 66 Years old  Completed   Hepatitis C Screening  Completed   Zoster Vaccines- Shingrix  Completed   HPV VACCINES  Aged Out   COVID-19 Vaccine  There are several new options available since this fall.   -consider prostate cancer screening with next set of labs if desired. Discuss with your Primary Care Provider.   -See a dentist at least yearly  -Get your eyes  exams per your eye specialist's recommendations  -I have included below further information regarding a healthy whole foods based diet, physical activity guidelines for adults, stress management and opportunities for social connections. I hope you find this information useful.     FOOD - THE FUEL FOR A HAPPY HEALTHY LIFE: -eat real food: lots of colorful vegetables (half the plate) -5-7 servings of vegetables and fruits per day (fresh or steamed is best), exp. 2 servings of vegetables with lunch and dinner and 2 servings of fruit per day. Berries and greens such as kale and collards are great choices.   -consume on a regular basis: whole grains (make sure first ingredient on label contains the word "whole"), fresh fruits, fish, nuts, seeds, healthy oils (such as olive oil, avocado oil, grape seed oil) -may eat small amounts of dairy and lean meat on occasion, but avoid processed meats such as ham, bacon, lunch meat, etc. -drink water -try to avoid fast food and pre-packaged foods, processed meat -try to avoid foods that contain any ingredients with names you do not recognize  -try to avoid sugar/sweets (except for the natural sugar that occurs in fresh fruit) -try to avoid sweet drinks -try to avoid white rice, white bread, pasta (unless whole grain), white or yellow potatoes  MOVE - the key to keeping your body moving and working best: -if you wish to increase your physical activity, do so gradually and with your doctor's approval  -move and stretch your body, legs, feet and arms when sitting for long periods -Physical activity guidelines for optimal health in adults: -least 150 minutes per week of aerobic exercise (can talk, but not sing) once approved by your doctor, 20-30 minutes of sustained activity or two 10 minute episodes of sustained activity every day.  -resistance training at least 2 days per week if approved by your doctor -balance exercises 3+ days per week:   Stand somewhere where you have something sturdy to hold onto if you lose balance.    1) lift up on toes, start with 5x per day and work up to 20x   2) stand and lift on leg straight out to the side so that foot is a few inches of the floor, start with 5x each side and work up to 20x each side   3) stand on one foot, start with  5 seconds each side and work up to 20 seconds on each side  If you need ideas or help with getting more active:  -Silver sneakers https://tools.silversneakers.com  -Walk with a Doc: http://stephens-thompson.biz/  -try to include resistance (weight lifting/strength building) and balance exercises  twice per week: or the following link for ideas: ChessContest.fr  UpdateClothing.com.cy  STRESS MANAGEMENT - so important for health and well being -try meditating, or just sitting quietly with deep breathing while intentionally relaxing all parts of your body for 5 minutes daily  SOCIAL CONNECTIONS: -options in [place] if you wish to engage in more social and exercise related activities:  -Silver sneakers https://tools.silversneakers.com  -Walk with a Doc: http://stephens-thompson.biz/  -Check out the [place] 50+ section on the [place] [employer] (hiking clubs, book clubs, cards and games, chess, exercise classes, aquatic classes and much more) - see the website for details: https://www.[place].gov/departments/parks-recreation/active-adults50  -YouTube has lots of exercise videos for different ages and abilities as well  -[place] (a variety of indoor and outdoor inperson activities for adults). 817-201-6972. [address]  -Virtual Online Classes (a variety of topics): see seniorplanet.org or call (928) 339-8108  -consider volunteering at a school, hospice center, church, senior center or elsewhere   -try meditating, or just sitting quietly with deep breathing while intentionally relaxing all parts of your body for 5 minutes daily

## 2022-05-30 ENCOUNTER — Other Ambulatory Visit: Payer: Self-pay | Admitting: *Deleted

## 2022-05-30 MED ORDER — LOSARTAN POTASSIUM-HCTZ 100-25 MG PO TABS: 1.0000 | ORAL_TABLET | Freq: Every day | ORAL | 1 refills | Status: DC

## 2022-05-30 MED ORDER — TAMSULOSIN HCL 0.4 MG PO CAPS: 0.4000 mg | ORAL_CAPSULE | Freq: Every day | ORAL | 1 refills | Status: DC

## 2022-05-30 NOTE — Addendum Note (Signed)
Addended by: Kem Boroughs D on: 05/30/2022 02:34 PM   Modules accepted: Orders

## 2022-06-04 ENCOUNTER — Encounter: Payer: Self-pay | Admitting: Family

## 2022-06-04 ENCOUNTER — Encounter: Payer: Self-pay | Admitting: Cardiology

## 2022-06-04 DIAGNOSIS — R400 Somnolence: Secondary | ICD-10-CM

## 2022-06-12 ENCOUNTER — Other Ambulatory Visit: Payer: Self-pay

## 2022-06-12 MED ORDER — AMLODIPINE BESYLATE 10 MG PO TABS: 10.0000 mg | ORAL_TABLET | Freq: Every day | ORAL | 0 refills | Status: DC

## 2022-06-12 NOTE — Telephone Encounter (Signed)
Advised Dr. Curt Bears does recommend restarting Flecainide. Pt discussed this with his wife, has only had 2 episodes since seeing up, both lasting less than 5-10 minutes. Pt is going to continue monitoring  for now. If reoccurs he will restart Flecainide and let us know if so. Pt appreciates my call.

## 2022-06-18 ENCOUNTER — Encounter: Payer: Self-pay | Admitting: Family

## 2022-06-18 ENCOUNTER — Ambulatory Visit (INDEPENDENT_AMBULATORY_CARE_PROVIDER_SITE_OTHER): Payer: Medicare Other | Admitting: Family

## 2022-06-18 VITALS — BP 125/51 | HR 66 | Temp 98.2°F | Resp 16 | Ht 67.0 in | Wt 208.0 lb

## 2022-06-18 DIAGNOSIS — E782 Mixed hyperlipidemia: Secondary | ICD-10-CM

## 2022-06-18 DIAGNOSIS — R7303 Prediabetes: Secondary | ICD-10-CM | POA: Diagnosis not present

## 2022-06-18 DIAGNOSIS — N401 Enlarged prostate with lower urinary tract symptoms: Secondary | ICD-10-CM

## 2022-06-18 DIAGNOSIS — Z125 Encounter for screening for malignant neoplasm of prostate: Secondary | ICD-10-CM | POA: Diagnosis not present

## 2022-06-18 DIAGNOSIS — I10 Essential (primary) hypertension: Secondary | ICD-10-CM | POA: Diagnosis not present

## 2022-06-18 DIAGNOSIS — E559 Vitamin D deficiency, unspecified: Secondary | ICD-10-CM | POA: Diagnosis not present

## 2022-06-18 DIAGNOSIS — J45909 Unspecified asthma, uncomplicated: Secondary | ICD-10-CM

## 2022-06-18 LAB — HEMOGLOBIN A1C: Hgb A1c MFr Bld: 6.1 % (ref 4.6–6.5)

## 2022-06-18 LAB — COMPREHENSIVE METABOLIC PANEL
ALT: 16 U/L (ref 0–53)
AST: 14 U/L (ref 0–37)
Albumin: 4.3 g/dL (ref 3.5–5.2)
Alkaline Phosphatase: 51 U/L (ref 39–117)
BUN: 13 mg/dL (ref 6–23)
CO2: 29 mEq/L (ref 19–32)
Calcium: 9.4 mg/dL (ref 8.4–10.5)
Chloride: 104 mEq/L (ref 96–112)
Creatinine, Ser: 0.85 mg/dL (ref 0.40–1.50)
GFR: 89.65 mL/min (ref >60.00)
Glucose, Bld: 124 mg/dL — ABNORMAL HIGH (ref 70–99)
Potassium: 4.6 mEq/L (ref 3.5–5.1)
Sodium: 139 mEq/L (ref 135–145)
Total Bilirubin: 0.5 mg/dL (ref 0.2–1.2)
Total Protein: 6.6 g/dL (ref 6.0–8.3)

## 2022-06-18 LAB — PSA: PSA: 0.71 ng/mL (ref 0.10–4.00)

## 2022-06-18 LAB — VITAMIN D 25 HYDROXY (VIT D DEFICIENCY, FRACTURES): VITD: 36.82 ng/mL (ref 30.00–100.00)

## 2022-06-18 MED ORDER — OMEPRAZOLE 40 MG PO CPDR: DELAYED_RELEASE_CAPSULE | ORAL | 1 refills | Status: DC

## 2022-06-18 MED ORDER — AMLODIPINE BESYLATE 10 MG PO TABS: 10.0000 mg | ORAL_TABLET | Freq: Every day | ORAL | 1 refills | Status: DC

## 2022-06-18 MED ORDER — METOPROLOL SUCCINATE ER 25 MG PO TB24: ORAL_TABLET | ORAL | 1 refills | Status: DC

## 2022-06-18 NOTE — Progress Notes (Signed)
Subjective:   By signing my name below, I, [name], attest that this documentation has been prepared under the direction and in the presence of Aaron Chimera, NP 06/18/2022    Patient ID: Aaron Ruiz, male    DOB: 09-Dec-1955, 66 y.o.   MRN: [id number]  Chief Complaint  Patient presents with   Hypertension    Here for follow up and med refills    HPI Patient is in today for an office visit.  Refills: He is requesting a refill of 10 mg of Amlodipine, 25 mg of Metoprolol Succinate and 40 mg of Prilosec.   Blood Pressure: As of today's visit, his blood pressure is normal. He is currently taking 10 mg of Amlodipine, 100-25 mg of Losartan hctz, and 25 mg of Metoprolol Succinate.  BP Readings from Last 3 Encounters:  06/18/22 (!) 125/51  05/13/22 112/68  03/08/22 116/66   Pulse Readings from Last 3 Encounters:  06/18/22 66  05/13/22 67  03/08/22 67   Afib: He reports that he had an atrial fibrillation ablation in 02/08/2022. He has been following up with his cardiologist since then. He reports that due to his specialist recommendation, has been taken off of Flecainide.   Urination: He reports that symptoms are controled. He is currently taking 0.4 mg of Flomax.   PSA: Last completed on 01/11/2020  Diet: He states that he does not consume significant amounts of sugar and tries to stay away from carbohydrates.   Exercise: He has recently joined the [facility] and states that he tries to go to the gym about 5 times a week.   Smoking: He previously smoked from age [age].   Cholesterol: His triglycerides are improving but still not at goal. However, his LDL levels are at goal. He is currently taking 40 mg of Pravachol and 1200 mg of Fish Oil Lab Results  Component Value Date   CHOL 171 12/10/2021   HDL 39.50 12/10/2021   LDLCALC 92 12/10/2021   LDLDIRECT 116.0 09/11/2020   TRIG 200.0 (H) 12/10/2021   CHOLHDL 4 12/10/2021   Albuterol: He states that he can go years  without taking his Albuterol but due to childhood asthma, prefers to keep the medication PRN. He takes Mucinex daily and develops a cough if he does not take it.   Vitamin D: he is continuing to take 2000 units of Vitamin D supplements.   Immunizations: He has not received the updated Covid 19 booster.   Reflux: He is currently taking 40 mg of Prilosec and states that symptoms are controlled   There are no preventive care reminders to display for this patient.  Past Medical History:  Diagnosis Date   Actinic keratosis 03/20/2009   Qualifier: Diagnosis of  By: Shawna Orleans DO, D. 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   Colon polyps    Daytime somnolence 09/21/2020   Diverticulosis    Essential hypertension 02/06/2009   Qualifier: Diagnosis of  By: [name], [name]      Fatigue 09/21/2020   Former smoker 09/21/2020   General medical examination 11/14/2010  GERD 02/06/2009   Qualifier: Diagnosis of  By: [name], [name]     GERD (gastroesophageal reflux disease)    on meds   High cholesterol 02/06/2009   Qualifier: Diagnosis of  By: [name], [name]     Hx of adenomatous colonic polyps 03/07/2009   Hx of eye surgery 2018   "Left Eye was swollen and had procedure to remove excess swelling"   Hyperglycemia 05/01/2011   Hyperlipidemia    Hypertension    Hypogonadism male 04/30/2011   Lactose intolerance    Lentigo 10/16/2020   Mild intermittent asthma 06/30/2018   Neoplasm of uncertain behavior of skin 10/16/2020   Obesity (BMI 30-39.9) 09/21/2020   Osteoarthritis 09/24/2011   OVERWEIGHT 02/06/2009   Qualifier: Diagnosis of  By: [name]     Palpitations 09/21/2020   Paroxysmal atrial fibrillation ([place]) 10/06/2020   Rash/urticaria 08/07/2012   Recurrent UTI 07/30/2013   Routine general medical examination at a health care facility 12/16/2011   Secondary hypercoagulable state ([place]) 10/06/2020   SKIN LESION 03/20/2009   Qualifier: Diagnosis of  By: [name]    Snoring 09/21/2020   Unspecified asthma(493.90) 02/06/2009   Centricity Description: ASTHMA Qualifier: Diagnosis of  By: [name], [name]   Centricity Description: ASTHMA, INTERMITTENT, MILD Qualifier: Diagnosis of  By: [name], Johntay Doolen S    URI (upper respiratory infection) 07/23/2011   Vitamin D deficiency 09/21/2020    Past Surgical History:  Procedure Laterality Date   ATRIAL FIBRILLATION ABLATION N/A 02/08/2022   Procedure: ATRIAL FIBRILLATION ABLATION;  Surgeon: Constance Haw, MD;  Location: [facility];  Service: Cardiovascular;  Laterality: N/A;   CYSTECTOMY  05/2004   removed fromback /sebaceous cyst   INGUINAL HERNIA REPAIR  1995   left   mohs procedure  2017   left side of nose   NASAL SEPTUM SURGERY      Family History  Problem Relation Age of Onset   Coronary artery disease Father        CABG X5   Diabetes Father    Colon cancer Maternal Aunt    Cancer Brother        lung (smoker)   Hypertension Other    Kidney disease Other     Social History   Socioeconomic History   Marital status: Married    Spouse name: Not on file   Number of children: Not on file   Years of education: Not on file   Highest education level: Not on file  Occupational History   Not on file  Tobacco Use   Smoking status: Former    Types: Cigarettes    Start date: 07/01/1969    Quit date: 07/01/1974    Years since quitting: 47.9   Smokeless tobacco: Never   Tobacco comments:    Former smoker 03/08/22  Vaping Use   Vaping Use: Never used  Substance and Sexual Activity   Alcohol use: No    Alcohol/week: 0.0 standard drinks of alcohol   Drug  use: No   Sexual activity: Not on file  Other Topics Concern   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[employer])   Married 40 years    1  daughter 18 - [name] (lives in [place])   1  son 20    Retired   Alcohol use-no   Smoking Status:  quit   Caffeine use/day:  2-3 beverages daily  Social Determinants of Health   Financial Resource Strain: Low Risk  (05/18/2021)   Overall Financial Resource Strain (CARDIA)    Difficulty of Paying Living Expenses: Not hard at all  Food Insecurity: No Food Insecurity (05/18/2021)   Hunger Vital Sign    Worried About Running Out of Food in the Last Year: Never true    Ran Out of Food in the Last Year: Never true  Transportation Needs: No Transportation Needs (05/18/2021)   PRAPARE - Hydrologist (Medical): No    Lack of Transportation (Non-Medical): No  Physical Activity: Sufficiently Active (05/18/2021)   Exercise Vital Sign    Days of Exercise per Week: 5 days    Minutes of Exercise per Session: 30 min  Stress: No Stress Concern Present (05/18/2021)   Kay    Feeling of Stress : Not at all  Social Connections: Moderately Integrated (05/18/2021)   Social Connection and Isolation Panel [NHANES]    Frequency of Communication with Friends and Family: More than three times a week    Frequency of Social Gatherings with Friends and Family: More than three times a week    Attends Religious Services: More than 4 times per year    Active Member of [employer] or Organizations: No    Attends Archivist Meetings: Never    Marital Status: Married  Human resources officer Violence: Not At Risk (05/18/2021)   Humiliation, Afraid, Rape, and Kick questionnaire    Fear of Current or Ex-Partner: No    Emotionally Abused: No    Physically Abused: No    Sexually Abused: No    Outpatient Medications Prior to Visit  Medication  Sig Dispense Refill   acetaminophen (TYLENOL) 500 MG tablet Take 1 tablet (500 mg total) by mouth every 6 (six) hours as needed. (Patient taking differently: Take 500 mg by mouth in the morning and at bedtime.) 30 tablet 5   albuterol (VENTOLIN HFA) 108 (90 Base) MCG/ACT inhaler Inhale 2 puffs into the lungs every 6 (six) hours as needed for wheezing or shortness of breath. 8 g 0   apixaban (ELIQUIS) 5 MG TABS tablet TAKE 1 TABLET(5 MG) BY MOUTH TWICE DAILY 60 tablet 5   cetirizine (ZYRTEC) 10 MG tablet Take 10 mg by mouth daily as needed for allergies.     Cholecalciferol (VITAMIN D) 50 MCG (2000 UT) tablet Take 2,000 Units by mouth daily.     diphenhydrAMINE (BENADRYL) 25 MG tablet Take 25 mg by mouth at bedtime.     GLUCOSAMINE-CHONDROITIN PO Take 1 tablet by mouth in the morning and at bedtime.     guaiFENesin (MUCINEX) 600 MG 12 hr tablet Take 600 mg by mouth at bedtime.     losartan-hydrochlorothiazide (HYZAAR) 100-25 MG tablet Take 1 tablet by mouth daily. 90 tablet 1   Omega-3 Fatty Acids (FISH OIL) 1200 MG CAPS Take 1,200 mg by mouth daily.     pravastatin (PRAVACHOL) 40 MG tablet TAKE 1 TABLET(40 MG) BY MOUTH DAILY 90 tablet 1   sildenafil (REVATIO) 20 MG tablet 1-2 tabs by mouth prior to sexual activity 30 tablet 5   tamsulosin (FLOMAX) 0.4 MG CAPS capsule Take 1 capsule (0.4 mg total) by mouth daily. 90 capsule 1   Turmeric Curcumin 500 MG CAPS Take 500 mg by mouth daily.     amLODipine (NORVASC) 10 MG tablet Take 1 tablet (10 mg total) by mouth daily. 30 tablet 0  metoprolol succinate (TOPROL-XL) 25 MG 24 hr tablet TAKE 1 TABLET(25 MG) BY MOUTH DAILY 90 tablet 1   omeprazole (PRILOSEC) 40 MG capsule TAKE 1 CAPSULE(40 MG) BY MOUTH DAILY 90 capsule 1   No facility-administered medications prior to visit.    Allergies  Allergen Reactions   Sulfonamide Derivatives     Hallucination (Childhood Reaction)    ROS See HPI    Objective:    Physical Exam Constitutional:       General: He is not in acute distress.    Appearance: Normal appearance. He is not ill-appearing.  HENT:     Head: Normocephalic and atraumatic.     Right Ear: External ear normal.     Left Ear: External ear normal.  Eyes:     Extraocular Movements: Extraocular movements intact.     Pupils: Pupils are equal, round, and reactive to light.  Cardiovascular:     Rate and Rhythm: Normal rate and regular rhythm.     Heart sounds: Normal heart sounds. No murmur heard.    No gallop.  Pulmonary:     Effort: Pulmonary effort is normal. No respiratory distress.     Breath sounds: Normal breath sounds. No wheezing or rales.  Skin:    General: Skin is warm and dry.  Neurological:     Mental Status: He is alert and oriented to person, place, and time.  Psychiatric:        Mood and Affect: Mood normal.        Behavior: Behavior normal.        Judgment: Judgment normal.     BP (!) 125/51 (BP Location: Right Arm, Patient Position: Sitting, Cuff Size: Large)   Pulse 66   Temp 98.2 F (36.8 C) (Oral)   Resp 16   Ht _0  (1.702 m)   Wt 208 lb (94.3 kg)   SpO2 98%   BMI 32.58 kg/m  Wt Readings from Last 3 Encounters:  06/18/22 208 lb (94.3 kg)  05/28/22 201 lb 8 oz (91.4 kg)  05/13/22 206 lb (93.4 kg)       Assessment & Plan:   Problem List Items Addressed This Visit       Unprioritized   Vitamin D deficiency   Relevant Orders   Vitamin D (25 hydroxy)   Hyperlipidemia    Lab Results  Component Value Date   CHOL 171 12/10/2021   HDL 39.50 12/10/2021   LDLCALC 92 12/10/2021   LDLDIRECT 116.0 09/11/2020   TRIG 200.0 (H) 12/10/2021   CHOLHDL 4 12/10/2021  LDL at goal, continues fish oil and pravachol.       Relevant Medications   amLODipine (NORVASC) 10 MG tablet   metoprolol succinate (TOPROL-XL) 25 MG 24 hr tablet   Essential hypertension - Primary    BP Readings from Last 3 Encounters:  06/18/22 (!) 125/51  05/13/22 112/68  03/08/22 116/66  At goal, continue  amlodipine, metoprolol and hyzaar.       Relevant Medications   amLODipine (NORVASC) 10 MG tablet   metoprolol succinate (TOPROL-XL) 25 MG 24 hr tablet   Other Relevant Orders   Comp Met (CMET)   BPH (benign prostatic hyperplasia)    Stable on flomax. Continue same.  Lab Results  Component Value Date   PSA 0.99 01/11/2020   PSA 0.64 12/31/2018   PSA 0.84 12/05/2017         Borderline diabetes    Lab Results  Component Value Date   HGBA1C 6.1  12/10/2021  Encouraged healthy diet and exercise.  Wt Readings from Last 3 Encounters:  06/18/22 208 lb (94.3 kg)  05/28/22 201 lb 8 oz (91.4 kg)  05/13/22 206 lb (93.4 kg)        Relevant Orders   HgB A1c   Asthma    Very rare symptoms, likes to keep albuterol in hand.        Other Visit Diagnoses     Screening for prostate cancer       Relevant Orders   PSA      Meds ordered this encounter  Medications   amLODipine (NORVASC) 10 MG tablet    Sig: Take 1 tablet (10 mg total) by mouth daily.    Dispense:  90 tablet    Refill:  1    Requested drug refills are authorized, however, the patient needs further evaluation and/or laboratory testing before further refills are given. Ask him to make an appointment for this.   metoprolol succinate (TOPROL-XL) 25 MG 24 hr tablet    Sig: 1 daily    Dispense:  90 tablet    Refill:  1   omeprazole (PRILOSEC) 40 MG capsule    Sig: TAKE 1 CAPSULE(40 MG) BY MOUTH DAILY    Dispense:  90 capsule    Refill:  1    I, Nance Pear, NP, personally preformed the services described in this documentation.  All medical record entries made by the scribe were at my direction and in my presence.  I have reviewed the chart and discharge instructions (if applicable) and agree that the record reflects my personal performance and is accurate and complete. 06/18/2022   I,Amber Collins,acting as a scribe for Nance Pear, NP.,have documented all relevant documentation on the behalf of  Nance Pear, NP,as directed by  Nance Pear, NP while in the presence of Nance Pear, NP.    Nance Pear, NP

## 2022-06-18 NOTE — Assessment & Plan Note (Signed)
Very rare symptoms, likes to keep albuterol in hand.

## 2022-06-18 NOTE — Assessment & Plan Note (Signed)
Stable on flomax. Continue same.  Lab Results  Component Value Date   PSA 0.99 01/11/2020   PSA 0.64 12/31/2018   PSA 0.84 12/05/2017

## 2022-06-18 NOTE — Assessment & Plan Note (Signed)
Lab Results  Component Value Date   HGBA1C 6.1 12/10/2021   Encouraged healthy diet and exercise.  Wt Readings from Last 3 Encounters:  06/18/22 208 lb (94.3 kg)  05/28/22 201 lb 8 oz (91.4 kg)  05/13/22 206 lb (93.4 kg)

## 2022-06-18 NOTE — Assessment & Plan Note (Signed)
BP Readings from Last 3 Encounters:  06/18/22 (!) 125/51  05/13/22 112/68  03/08/22 116/66   At goal, continue amlodipine, metoprolol and hyzaar.

## 2022-06-18 NOTE — Assessment & Plan Note (Signed)
Lab Results  Component Value Date   CHOL 171 12/10/2021   HDL 39.50 12/10/2021   LDLCALC 92 12/10/2021   LDLDIRECT 116.0 09/11/2020   TRIG 200.0 (H) 12/10/2021   CHOLHDL 4 12/10/2021   LDL at goal, continues fish oil and pravachol.

## 2022-06-23 ENCOUNTER — Encounter: Payer: Self-pay | Admitting: Cardiology

## 2022-07-06 ENCOUNTER — Other Ambulatory Visit: Payer: Self-pay | Admitting: Family

## 2022-07-20 NOTE — Progress Notes (Signed)
07/23/22- 67 yoM former smoker for sleep evaluation with concern of daytime somnolence, courtesy of Saunders Glance, NP Medical problem list includes HTN, PAFib/ablation/ Eliquis, Asthma, Allergic Rhinitis, GERD, Diverticulosis, BPH, Overweight, Epworth score-4 Body weight today- Covid vax-2Phizer Flu vax-had  -----Never had a sleep study. Has been snoring for years, occ trouble falling back to sleep, and some daytime sleepiness states he starts yawning more in the afternoon Retired. Dozes off if reading, but no problem with alertness if doing anything. ENT - remote broken nose, septoplasty. Still breathes more easily out of one side. Sleeps on sides. Benadryl for sleep. 1-2 cups coffee in AM. No blood relatives with sleep apnea.  Prior to Admission medications   Medication Sig Start Date End Date Taking? Authorizing Provider  acetaminophen (TYLENOL) 500 MG tablet Take 1 tablet (500 mg total) by mouth every 6 (six) hours as needed. Patient taking differently: Take 500 mg by mouth in the morning and at bedtime. 06/19/17  Yes [name], NP  amLODipine (NORVASC) 10 MG tablet Take 1 tablet (10 mg total) by mouth daily. 06/18/22  Yes [name], NP  apixaban (ELIQUIS) 5 MG TABS tablet TAKE 1 TABLET(5 MG) BY MOUTH TWICE DAILY 04/22/22  Yes [name], [name], DO  cetirizine (ZYRTEC) 10 MG tablet Take 10 mg by mouth daily as needed for allergies.   Yes [provider]  Cholecalciferol (VITAMIN D) 50 MCG (2000 UT) tablet Take 2,000 Units by mouth daily.   Yes [provider]  diphenhydrAMINE (BENADRYL) 25 MG tablet Take 25 mg by mouth at bedtime.   Yes [provider]  flecainide (TAMBOCOR) 50 MG tablet Take 50 mg by mouth 2 (two) times daily. 07/09/22  Yes [provider]  GLUCOSAMINE-CHONDROITIN PO Take 1 tablet by mouth in the morning and at bedtime.   Yes [provider]  guaiFENesin (MUCINEX) 600 MG 12 hr tablet Take 600 mg by mouth at  bedtime.   Yes [provider]  losartan-hydrochlorothiazide (HYZAAR) 100-25 MG tablet Take 1 tablet by mouth daily. 05/30/22  Yes [name], NP  metoprolol succinate (TOPROL-XL) 25 MG 24 hr tablet 1 daily 06/18/22  Yes [name], NP  Omega-3 Fatty Acids (FISH OIL) 1200 MG CAPS Take 1,200 mg by mouth daily.   Yes [provider]  omeprazole (PRILOSEC) 40 MG capsule TAKE 1 CAPSULE(40 MG) BY MOUTH DAILY 06/18/22  Yes [name], NP  pravastatin (PRAVACHOL) 40 MG tablet TAKE 1 TABLET(40 MG) BY MOUTH DAILY 07/06/22  Yes [name], NP  sildenafil (REVATIO) 20 MG tablet 1-2 tabs by mouth prior to sexual activity 12/12/21  Yes [name], NP  tamsulosin (FLOMAX) 0.4 MG CAPS capsule Take 1 capsule (0.4 mg total) by mouth daily. 05/30/22  Yes [name], NP  Turmeric Curcumin 500 MG CAPS Take 500 mg by mouth daily.   Yes [provider]  albuterol (VENTOLIN HFA) 108 (90 Base) MCG/ACT inhaler Inhale 2 puffs into the lungs every 6 (six) hours as needed for wheezing or shortness of breath. Patient not taking: Reported on 07/23/2022 06/04/21   [name], NP   Past Medical History:  Diagnosis Date   Actinic keratosis 03/20/2009   Qualifier: Diagnosis of  By: [name]    Allergic conjunctivitis 06/30/2018   Anemia    ANEMIA-NOS 02/06/2009   Qualifier: Diagnosis of  By: [name], [name]     Asthma    uses inhaler   Basal cell carcinoma 2017   left side of nose  Basal cell carcinoma of nose 10/16/2020   Borderline diabetes 05/03/2011   BPH (benign prostatic hyperplasia) 07/29/2014   Chemosis of conjunctiva of both eyes 01/15/2017   Colon polyps    Daytime somnolence 09/21/2020   Diverticulosis    Essential hypertension 02/06/2009   Qualifier: Diagnosis of  By: [name], [name]      Fatigue 09/21/2020   Former smoker 09/21/2020   General medical examination 11/14/2010   GERD 02/06/2009   Qualifier: Diagnosis  of  By: [name], [name]     GERD (gastroesophageal reflux disease)    on meds   High cholesterol 02/06/2009   Qualifier: Diagnosis of  By: [name], [name]     Hx of adenomatous colonic polyps 03/07/2009   Hx of eye surgery 2018   "Left Eye was swollen and had procedure to remove excess swelling"   Hyperglycemia 05/01/2011   Hyperlipidemia    Hypertension    Hypogonadism male 04/30/2011   Lactose intolerance    Lentigo 10/16/2020   Mild intermittent asthma 06/30/2018   Neoplasm of uncertain behavior of skin 10/16/2020   Obesity (BMI 30-39.9) 09/21/2020   Osteoarthritis 09/24/2011   OVERWEIGHT 02/06/2009   Qualifier: Diagnosis of  By: [name]    Palpitations 09/21/2020   Paroxysmal atrial fibrillation ([place]) 10/06/2020   Rash/urticaria 08/07/2012   Recurrent UTI 07/30/2013   Routine general medical examination at a health care facility 12/16/2011   Secondary hypercoagulable state ([place]) 10/06/2020   SKIN LESION 03/20/2009   Qualifier: Diagnosis of  By: [name]    Snoring 09/21/2020   Unspecified asthma(493.90) 02/06/2009   Centricity Description: ASTHMA Qualifier: Diagnosis of  By: [name], [name]   Centricity Description: ASTHMA, INTERMITTENT, MILD Qualifier: Diagnosis of  By: [name], [name] S    URI (upper respiratory infection) 07/23/2011   Vitamin D deficiency 09/21/2020   Past Surgical History:  Procedure Laterality Date   ATRIAL FIBRILLATION ABLATION N/A 02/08/2022   Procedure: ATRIAL FIBRILLATION ABLATION;  Surgeon: Constance Haw, MD;  Location: [facility];  Service: Cardiovascular;  Laterality: N/A;   CYSTECTOMY  05/2004   removed fromback /sebaceous cyst   INGUINAL HERNIA REPAIR  1995   left   mohs procedure  2017   left side of nose   NASAL SEPTUM SURGERY     Family History  Problem Relation Age of Onset   Coronary artery disease Father        CABG X5   Diabetes Father    Colon cancer Maternal Aunt    Cancer Brother        lung  (smoker)   Hypertension Other    Kidney disease Other    Social History   Socioeconomic History   Marital status: Married    Spouse name: Not on file   Number of children: Not on file   Years of education: Not on file   Highest education level: Not on file  Occupational History   Not on file  Tobacco Use   Smoking status: Former    Types: Cigarettes    Start date: 07/01/1969    Quit date: 07/01/1974    Years since quitting: 48.0   Smokeless tobacco: Never   Tobacco comments:    Former smoker 03/08/22  Vaping Use   Vaping Use: Never used  Substance and Sexual Activity   Alcohol use: No    Alcohol/week: 0.0 standard drinks of alcohol   Drug use: No   Sexual  activity: Not on file  Other Topics Concern   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[employer])   Married 62 years    1  daughter 2 - [name] (lives in [place])   1  son 22    Retired   Alcohol use-no   Smoking Status:  quit   Caffeine use/day:  2-3 beverages daily   Social Determinants of Health   Financial Resource Strain: Low Risk  (05/18/2021)   Overall Financial Resource Strain (CARDIA)    Difficulty of Paying Living Expenses: Not hard at all  Food Insecurity: No Food Insecurity (05/18/2021)   Hunger Vital Sign    Worried About Running Out of Food in the Last Year: Never true    Ran Out of Food in the Last Year: Never true  Transportation Needs: No Transportation Needs (05/18/2021)   PRAPARE - Hydrologist (Medical): No    Lack of Transportation (Non-Medical): No  Physical Activity: Sufficiently Active (05/18/2021)   Exercise Vital Sign    Days of Exercise per Week: 5 days    Minutes of Exercise per Session: 30 min  Stress: No Stress Concern Present (05/18/2021)   [place]    Feeling of Stress : Not at all  Social Connections: Moderately Integrated (05/18/2021)   Social  Connection and Isolation Panel [NHANES]    Frequency of Communication with Friends and Family: More than three times a week    Frequency of Social Gatherings with Friends and Family: More than three times a week    Attends Religious Services: More than 4 times per year    Active Member of [employer] or Organizations: No    Attends Archivist Meetings: Never    Marital Status: Married  Human resources officer Violence: Not At Risk (05/18/2021)   Humiliation, Afraid, Rape, and Kick questionnaire    Fear of Current or Ex-Partner: No    Emotionally Abused: No    Physically Abused: No    Sexually Abused: No   ROS-see HPI  + = positive Constitutional:    weight loss, night sweats, fevers, chills, fatigue, lassitude. HEENT:    headaches, difficulty swallowing, tooth/dental problems, sore throat,       sneezing, itching, ear ache, nasal congestion, post nasal drip, snoring CV:    chest pain, orthopnea, PND, swelling in lower extremities, anasarca,                                   dizziness, +palpitations Resp:   shortness of breath with exertion or at rest.                productive cough,   non-productive cough, coughing up of blood.              change in color of mucus.  wheezing.   Skin:    rash or lesions. GI:  No-   heartburn, indigestion, abdominal pain, nausea, vomiting, diarrhea,                 change in bowel habits, loss of appetite GU: dysuria, change in color of urine, no urgency or frequency.   flank pain. MS:   joint pain, stiffness, decreased range of motion, back pain. Neuro-     nothing unusual Psych:  change in mood or affect.  depression or  anxiety.   memory loss.  OBJ- Physical Exam General- Alert, Oriented, Affect-appropriate, Distress- none acute, +overweight Skin- rash-none, lesions- none, excoriation- none Lymphadenopathy- none Head- atraumatic            Eyes- Gross vision intact, PERRLA, conjunctivae and secretions clear            Ears- +Hearing aids             Nose- Clear, no-Septal dev, mucus, polyps, erosion, perforation             Throat- Mallampati III , mucosa clear , drainage- none, tonsils+, +teeth Neck- flexible , trachea midline, no stridor , thyroid nl, carotid no bruit Chest - symmetrical excursion , unlabored           Heart/CV- RRR , no murmur , no gallop  , no rub, nl s1 s2                           - JVD- none , edema- none, stasis changes- none, varices- none           Lung- clear to P&A, wheeze- none, cough- none , dullness-none, rub- none           Chest wall-  Abd-  Br/ Gen/ Rectal- Not done, not indicated Extrem- cyanosis- none, clubbing, none, atrophy- none, strength- nl Neuro- grossly intact to observation

## 2022-07-23 ENCOUNTER — Encounter: Payer: Self-pay | Admitting: Internal Medicine

## 2022-07-23 ENCOUNTER — Ambulatory Visit: Payer: Medicare Other | Admitting: Internal Medicine

## 2022-07-23 VITALS — BP 126/62 | HR 63 | Ht 67.0 in | Wt 211.6 lb

## 2022-07-23 DIAGNOSIS — R0683 Snoring: Secondary | ICD-10-CM | POA: Diagnosis not present

## 2022-07-23 NOTE — Assessment & Plan Note (Signed)
Probable OSA. Appropriate discussion, questions answered, safe driving, testing and potential treatments. Plan- schedule sleep test

## 2022-07-23 NOTE — Patient Instructions (Signed)
Order- schedule home sleep test   dx snoring  Please call us 2 weeks after your sleep test for result and recommendations

## 2022-08-07 ENCOUNTER — Telehealth: Payer: Self-pay | Admitting: Family

## 2022-08-07 NOTE — Telephone Encounter (Signed)
Called a few times but no answer, lvm to advise patient if he is having significant symptoms he needs to seek in person evaluation at emergency facility or an urgent care in the area.

## 2022-08-07 NOTE — Telephone Encounter (Signed)
Patient called to advise that he is at [facility], [place] and tested positive for Covid. He said that he has mainly sinus related symptoms and a little cough. No fever or anything. He said cough medicine is helping. Patient said he uses [facility] when he is there in the summer. Tried to schedule a vv until I realized [place] is out of [place]. He will not be back in town until next Wednesday. Please call patient and advise what he can do.

## 2022-08-07 NOTE — Telephone Encounter (Signed)
Noted and agree. 

## 2022-08-19 ENCOUNTER — Telehealth: Payer: Self-pay | Admitting: Cardiology

## 2022-08-19 NOTE — Telephone Encounter (Signed)
Pt is inquiring to see if he needs to see a general cardiologist anymore since he hasn't seen anyone since April 22', or if he is ok to just see [facility] for EP. Please advise.

## 2022-08-20 ENCOUNTER — Ambulatory Visit: Payer: Medicare Other | Admitting: Cardiology

## 2022-08-20 NOTE — Telephone Encounter (Signed)
Pt aware he does not need a general cardiologist at this time, his needs are only electrical. Advised that his PCP should be following his HTN & cholesterol. Patient verbalized understanding and agreeable to plan.

## 2022-09-02 ENCOUNTER — Encounter: Payer: Self-pay | Admitting: Family

## 2022-09-02 MED ORDER — OMEPRAZOLE 40 MG PO CPDR: DELAYED_RELEASE_CAPSULE | ORAL | 1 refills | Status: DC

## 2022-09-03 DIAGNOSIS — G4730 Sleep apnea, unspecified: Secondary | ICD-10-CM | POA: Diagnosis not present

## 2022-09-06 DIAGNOSIS — M25552 Pain in left hip: Secondary | ICD-10-CM | POA: Diagnosis not present

## 2022-09-15 ENCOUNTER — Ambulatory Visit (INDEPENDENT_AMBULATORY_CARE_PROVIDER_SITE_OTHER): Payer: Medicare Other

## 2022-09-15 DIAGNOSIS — G4733 Obstructive sleep apnea (adult) (pediatric): Secondary | ICD-10-CM | POA: Diagnosis not present

## 2022-09-15 DIAGNOSIS — R0683 Snoring: Secondary | ICD-10-CM

## 2022-09-16 ENCOUNTER — Encounter: Payer: Self-pay | Admitting: Internal Medicine

## 2022-09-16 ENCOUNTER — Other Ambulatory Visit: Payer: Self-pay

## 2022-09-16 DIAGNOSIS — R0683 Snoring: Secondary | ICD-10-CM

## 2022-09-17 NOTE — Telephone Encounter (Signed)
Dr. Annamaria Boots, please advise on pt's home sleep study results. Thanks.

## 2022-09-17 NOTE — Telephone Encounter (Signed)
Aaron Ruiz's home sleep test showed mild obsructive sleep apnea, averaging between 9 and 10 apneas/ hour with drrops in blood oxygen as low as 73%.  I suggest we try CPAP-  Order- new DME, new CPAP auto 5-15, mask of choice, humidifier, supplies, AirView/ card

## 2022-09-24 ENCOUNTER — Encounter: Payer: Self-pay | Admitting: Cardiology

## 2022-09-24 DIAGNOSIS — I480 Paroxysmal atrial fibrillation: Secondary | ICD-10-CM

## 2022-09-24 MED ORDER — APIXABAN 5 MG PO TABS: ORAL_TABLET | ORAL | 5 refills | Status: DC

## 2022-09-24 MED ORDER — FLECAINIDE ACETATE 50 MG PO TABS: 50.0000 mg | ORAL_TABLET | Freq: Two times a day (BID) | ORAL | 2 refills | Status: DC

## 2022-09-24 NOTE — Telephone Encounter (Signed)
Pt last saw Dr Curt Bears 05/13/22, last labs 06/18/22 Creat 0.85, age 67, weight 96kg, based on specified criteria pt is on appropriate dosage of Eliquis 5mg  BID for afib.  Will refill rx.

## 2022-09-30 NOTE — Progress Notes (Deleted)
07/23/22- 67 yoM former smoker for sleep evaluation with concern of daytime somnolence, courtesy of Saunders Glance, NP Medical problem list includes HTN, PAFib/ablation/ Eliquis, Asthma, Allergic Rhinitis, GERD, Diverticulosis, BPH, Overweight, Epworth score-4 Body weight today- Covid vax-2Phizer Flu vax-had  -----Never had a sleep study. Has been snoring for years, occ trouble falling back to sleep, and some daytime sleepiness states he starts yawning more in the afternoon Retired. Dozes off if reading, but no problem with alertness if doing anything. ENT - remote broken nose, septoplasty. Still breathes more easily out of one side. Sleeps on sides. Benadryl for sleep. 1-2 cups coffee in AM. No blood relatives with sleep apnea.  10/03/22- 67 yoM former smoker followed for OSA, complicated by  HTN, PAFib/ablation/ Eliquis, Asthma, Allergic Rhinitis, GERD, Diverticulosis, BPH, Overweight, HST/SNAP 09/03/22- AHI 9.5/ hr, desaturation to 77%, 14 min</= 88%, body weight 211 lbs CPAP auto 5-15/ [facility]          ordered 09/18/22 -Ventolin hfa, Body weight today- Covid vax-2Phizer Flu vax-had    Constitutional:    weight loss, night sweats, fevers, chills, fatigue, lassitude. HEENT:    headaches, difficulty swallowing, tooth/dental problems, sore throat,       sneezing, itching, ear ache, nasal congestion, post nasal drip, snoring CV:    chest pain, orthopnea, PND, swelling in lower extremities, anasarca,                                   dizziness, +palpitations Resp:   shortness of breath with exertion or at rest.                productive cough,   non-productive cough, coughing up of blood.              change in color of mucus.  wheezing.   Skin:    rash or lesions. GI:  No-   heartburn, indigestion, abdominal pain, nausea, vomiting, diarrhea,                 change in bowel habits, loss of appetite GU: dysuria, change in color of urine, no urgency or frequency.   flank pain. MS:   joint  pain, stiffness, decreased range of motion, back pain. Neuro-     nothing unusual Psych:  change in mood or affect.  depression or anxiety.   memory loss.  OBJ- Physical Exam General- Alert, Oriented, Affect-appropriate, Distress- none acute, +overweight Skin- rash-none, lesions- none, excoriation- none Lymphadenopathy- none Head- atraumatic            Eyes- Gross vision intact, PERRLA, conjunctivae and secretions clear            Ears- +Hearing aids            Nose- Clear, no-Septal dev, mucus, polyps, erosion, perforation             Throat- Mallampati III , mucosa clear , drainage- none, tonsils+, +teeth Neck- flexible , trachea midline, no stridor , thyroid nl, carotid no bruit Chest - symmetrical excursion , unlabored           Heart/CV- RRR , no murmur , no gallop  , no rub, nl s1 s2                           - JVD- none , edema- none, stasis changes- none, varices- none  Lung- clear to P&A, wheeze- none, cough- none , dullness-none, rub- none           Chest wall-  Abd-  Br/ Gen/ Rectal- Not done, not indicated Extrem- cyanosis- none, clubbing, none, atrophy- none, strength- nl Neuro- grossly intact to observation

## 2022-10-03 ENCOUNTER — Ambulatory Visit: Payer: Medicare Other | Admitting: Internal Medicine

## 2022-10-22 ENCOUNTER — Encounter: Payer: Self-pay | Admitting: Cardiology

## 2022-10-24 DIAGNOSIS — G4733 Obstructive sleep apnea (adult) (pediatric): Secondary | ICD-10-CM | POA: Diagnosis not present

## 2022-11-18 ENCOUNTER — Ambulatory Visit: Payer: Medicare Other | Attending: Cardiology | Admitting: Cardiology

## 2022-11-18 ENCOUNTER — Encounter: Payer: Self-pay | Admitting: Cardiology

## 2022-11-18 VITALS — BP 144/64 | HR 63 | Ht 67.0 in | Wt 208.0 lb

## 2022-11-18 DIAGNOSIS — I480 Paroxysmal atrial fibrillation: Secondary | ICD-10-CM | POA: Diagnosis not present

## 2022-11-18 MED ORDER — FLECAINIDE ACETATE 100 MG PO TABS: 100.0000 mg | ORAL_TABLET | Freq: Two times a day (BID) | ORAL | 2 refills | Status: DC

## 2022-11-18 NOTE — Progress Notes (Signed)
Electrophysiology Office Note   Date:  11/18/2022   ID:  Aaron Ruiz, DOB 1955/11/30, MRN [id number]  PCP:  Sandford Craze, NP  Cardiologist:  [name] Primary Electrophysiologist:  Lori Popowski Jorja Loa, MD    Chief Complaint: AF   History of Present Illness: Aaron Ruiz is a 67 y.o. male who is being seen today for the evaluation of AF at the request of Sandford Craze, NP. Presenting today for electrophysiology evaluation.  He has a history of hypertension, hyperlipidemia, atrial fibrillation.  He was diagnosed with atrial fibrillation May 2022.  He is post ablation 02/08/2022.  His atrial fibrillation symptoms were weakness, fatigue.  He is unfortunately continued to have episodes of atrial fibrillation.  He is now back on flecainide.  Episodes last a few minutes at a time.  Today, denies symptoms of palpitations, chest pain, shortness of breath, orthopnea, PND, lower extremity edema, claudication, dizziness, presyncope, syncope, bleeding, or neurologic sequela. The patient is tolerating medications without difficulties.    Past Medical History:  Diagnosis Date   Actinic keratosis 03/20/2009   Qualifier: Diagnosis of  By: Artist Pais DO, D. Robert    Allergic conjunctivitis 06/30/2018   Anemia    ANEMIA-NOS 02/06/2009   Qualifier: Diagnosis of  By: Terrilee Croak CMA, [name]     Asthma    uses inhaler   Basal cell carcinoma 2017   left side of nose   Basal cell carcinoma of nose 10/16/2020   Borderline diabetes 05/03/2011   BPH (benign prostatic hyperplasia) 07/29/2014   Chemosis of conjunctiva of both eyes 01/15/2017   Colon polyps    Daytime somnolence 09/21/2020   Diverticulosis    Essential hypertension 02/06/2009   Qualifier: Diagnosis of  By: Terrilee Croak CMA, [name]      Fatigue 09/21/2020   Former smoker 09/21/2020   General medical examination 11/14/2010   GERD 02/06/2009   Qualifier: Diagnosis of  By: Terrilee Croak CMA, [name]     GERD (gastroesophageal reflux disease)    on meds    High cholesterol 02/06/2009   Qualifier: Diagnosis of  By: Terrilee Croak CMA, [name]     Hx of adenomatous colonic polyps 03/07/2009   Hx of eye surgery 2018   "Left Eye was swollen and had procedure to remove excess swelling"   Hyperglycemia 05/01/2011   Hyperlipidemia    Hypertension    Hypogonadism male 04/30/2011   Lactose intolerance    Lentigo 10/16/2020   Mild intermittent asthma 06/30/2018   Neoplasm of uncertain behavior of skin 10/16/2020   Obesity (BMI 30-39.9) 09/21/2020   Osteoarthritis 09/24/2011   OVERWEIGHT 02/06/2009   Qualifier: Diagnosis of  By: Nena Jordan    Palpitations 09/21/2020   Paroxysmal atrial fibrillation (HCC) 10/06/2020   Rash/urticaria 08/07/2012   Recurrent UTI 07/30/2013   Routine general medical examination at a health care facility 12/16/2011   Secondary hypercoagulable state (HCC) 10/06/2020   SKIN LESION 03/20/2009   Qualifier: Diagnosis of  By: Nena Jordan    Snoring 09/21/2020   Unspecified asthma(493.90) 02/06/2009   Centricity Description: ASTHMA Qualifier: Diagnosis of  By: Terrilee Croak CMA, [name]   Centricity Description: ASTHMA, INTERMITTENT, MILD Qualifier: Diagnosis of  By: Peggyann Juba FNP, [name] S    URI (upper respiratory infection) 07/23/2011   Vitamin D deficiency 09/21/2020   Past Surgical History:  Procedure Laterality Date   ATRIAL FIBRILLATION ABLATION N/A 02/08/2022   Procedure: ATRIAL FIBRILLATION ABLATION;  Surgeon: Regan Lemming, MD;  [facility]: [facility];  Service: Cardiovascular;  Laterality: N/A;   CYSTECTOMY  05/2004   removed fromback /sebaceous cyst   INGUINAL HERNIA REPAIR  1995   left   mohs procedure  2017   left side of nose   NASAL SEPTUM SURGERY       Current Outpatient Medications  Medication Sig Dispense Refill   acetaminophen (TYLENOL) 500 MG tablet Take 1 tablet (500 mg total) by mouth every 6 (six) hours as needed. (Patient taking differently: Take 500 mg by mouth in the morning and at bedtime.) 30  tablet 5   amLODipine (NORVASC) 10 MG tablet Take 1 tablet (10 mg total) by mouth daily. 90 tablet 1   apixaban (ELIQUIS) 5 MG TABS tablet TAKE 1 TABLET(5 MG) BY MOUTH TWICE DAILY 60 tablet 5   cetirizine (ZYRTEC) 10 MG tablet Take 10 mg by mouth daily as needed for allergies.     Cholecalciferol (VITAMIN D) 50 MCG (2000 UT) tablet Take 2,000 Units by mouth daily.     diphenhydrAMINE (BENADRYL) 25 MG tablet Take 25 mg by mouth at bedtime.     flecainide (TAMBOCOR) 100 MG tablet Take 1 tablet (100 mg total) by mouth 2 (two) times daily. 180 tablet 2   GLUCOSAMINE-CHONDROITIN PO Take 1 tablet by mouth in the morning and at bedtime.     guaiFENesin (MUCINEX) 600 MG 12 hr tablet Take 600 mg by mouth at bedtime.     losartan-hydrochlorothiazide (HYZAAR) 100-25 MG tablet Take 1 tablet by mouth daily. 90 tablet 1   metoprolol succinate (TOPROL-XL) 25 MG 24 hr tablet 1 daily 90 tablet 1   Omega-3 Fatty Acids (FISH OIL) 1200 MG CAPS Take 1,200 mg by mouth daily.     omeprazole (PRILOSEC) 40 MG capsule TAKE 1 CAPSULE(40 MG) BY MOUTH DAILY 90 capsule 1   pravastatin (PRAVACHOL) 40 MG tablet TAKE 1 TABLET(40 MG) BY MOUTH DAILY 90 tablet 1   sildenafil (REVATIO) 20 MG tablet 1-2 tabs by mouth prior to sexual activity 30 tablet 5   tamsulosin (FLOMAX) 0.4 MG CAPS capsule Take 1 capsule (0.4 mg total) by mouth daily. 90 capsule 1   Turmeric Curcumin 500 MG CAPS Take 500 mg by mouth daily.     albuterol (VENTOLIN HFA) 108 (90 Base) MCG/ACT inhaler Inhale 2 puffs into the lungs every 6 (six) hours as needed for wheezing or shortness of breath. (Patient not taking: Reported on 11/18/2022) 8 g 0   No current facility-administered medications for this visit.    Allergies:   Sulfonamide derivatives   Social History:  The patient  reports that he quit smoking about 48 years ago. His smoking use included cigarettes. He started smoking about 53 years ago. He has never used smokeless tobacco. He reports that he does  not drink alcohol and does not use drugs.   Family History:  The patient's family history includes Cancer in his brother; Colon cancer in his maternal aunt; Coronary artery disease in his father; Diabetes in his father; Hypertension in an other family member; Kidney disease in an other family member.   ROS:  Please see the history of present illness.   Otherwise, review of systems is positive for none.   All other systems are reviewed and negative.   PHYSICAL EXAM: VS:  BP (!) 144/64   Pulse 63   Ht 5\' 7"  (1.702 m)   Wt 208 lb (94.3 kg)   SpO2 97%   BMI 32.58 kg/m  , BMI Body mass index is 32.58  kg/m. GEN: Well nourished, well developed, in no acute distress  HEENT: normal  Neck: no JVD, carotid bruits, or masses Cardiac: RRR; no murmurs, rubs, or gallops,no edema  Respiratory:  clear to auscultation bilaterally, normal work of breathing GI: soft, nontender, nondistended, + BS MS: no deformity or atrophy  Skin: warm and dry Neuro:  Strength and sensation are intact Psych: euthymic mood, full affect  EKG:  EKG is ordered today. Personal review of the ekg ordered shows sinus rhythm   Recent Labs: 01/28/2022: Hemoglobin 13.4; Platelets 142 06/18/2022: ALT 16; BUN 13; Creatinine, Ser 0.85; Potassium 4.6; Sodium 139    Lipid Panel     Component Value Date/Time   CHOL 171 12/10/2021 1212   TRIG 200.0 (H) 12/10/2021 1212   HDL 39.50 12/10/2021 1212   CHOLHDL 4 12/10/2021 1212   VLDL 40.0 12/10/2021 1212   LDLCALC 92 12/10/2021 1212   LDLDIRECT 116.0 09/11/2020 1232     Wt Readings from Last 3 Encounters:  11/18/22 208 lb (94.3 kg)  07/23/22 211 lb 9.6 oz (96 kg)  06/18/22 208 lb (94.3 kg)      Other studies Reviewed: Additional studies/ records that were reviewed today include: TTE 11/01/20  Review of the above records today demonstrates:   1. Left ventricular ejection fraction, by estimation, is 60 to 65%. The  left ventricle has normal function. The left ventricle  has no regional  wall motion abnormalities. Left ventricular diastolic parameters were  normal.   2. Right ventricular systolic function is normal. The right ventricular  size is normal.   3. The mitral valve is normal in structure. Trivial mitral valve  regurgitation. No evidence of mitral stenosis.   4. The aortic valve is normal in it this point, he is not quite ready for ablation structure. Aortic valve regurgitation is  not visualized. No aortic stenosis is present.   5. The inferior vena cava is normal in size with greater than 50%  respiratory variability, suggesting right atrial pressure of 3 mmHg.   Cardiac monitor 10/11/2020 personally reviewed This study is remarkable for symptomatic atrial fibrillation/atrial flutter and paroxysmal atrial tachycardia.  ASSESSMENT AND PLAN:  1.  Paroxysmal atrial fibrillation: Currently on Eliquis.  CHA2DS2-VASc of 2.  Status post ablation 02/08/2022.  To have episodes of atrial fibrillation.  Allannah Kempen increase flecainide to 100 mg.  2.  Secondary hypercoagulable state: Currently on Eliquis for atrial fibrillation  3.  Hypertension: Mildly elevated today.  Has been on medications for a URI.  No changes.  4.  Obesity: Lifestyle modification encouraged.  Has joined the [employer]. Body mass index is 32.58 kg/m.   Current medicines are reviewed at length with the patient today.   The patient does not have concerns regarding his medicines.  The following changes were made today: Increase flecainide  Labs/ tests ordered today include:  Orders Placed This Encounter  Procedures   EKG 12-Lead      Disposition:   FU 6 months  Signed, Shianne Zeiser Jorja Loa, MD  11/18/2022 11:30 AM     [facility] [address] [place] [address] [phone] (office) [phone] (fax)

## 2022-11-18 NOTE — Patient Instructions (Signed)
Medication Instructions:  ?Your physician has recommended you make the following change in your medication:  ?INCREASE Flecainide to 100 mg twice daily ? ?*If you need a refill on your cardiac medications before your next appointment, please call your pharmacy* ? ? ?Lab Work: ?None ordered ? ? ?Testing/Procedures: ?None ordered ? ? ?Follow-Up: ?At [facility], you and your health needs are our priority.  As part of our continuing mission to provide you with exceptional heart care, we have created designated Provider Care Teams.  These Care Teams include your primary Cardiologist (physician) and Advanced Practice Providers (APPs -  Physician Assistants and Nurse Practitioners) who all work together to provide you with the care you need, when you need it. ? ?Your next appointment:   ?6 month(s) ? ?The format for your next appointment:   ?In Person ? ?Provider:   ?Will Camnitz, MD ? ? ? ?Thank you for choosing [facility]!! ? ? ?Anandi Abramo, RN ?[phone] ? ?

## 2022-11-19 DIAGNOSIS — H524 Presbyopia: Secondary | ICD-10-CM | POA: Diagnosis not present

## 2022-11-22 ENCOUNTER — Ambulatory Visit (INDEPENDENT_AMBULATORY_CARE_PROVIDER_SITE_OTHER): Payer: Medicare Other | Admitting: Family

## 2022-11-22 VITALS — BP 127/51 | HR 58 | Temp 98.2°F | Resp 16 | Wt 208.0 lb

## 2022-11-22 DIAGNOSIS — E782 Mixed hyperlipidemia: Secondary | ICD-10-CM

## 2022-11-22 DIAGNOSIS — K219 Gastro-esophageal reflux disease without esophagitis: Secondary | ICD-10-CM

## 2022-11-22 DIAGNOSIS — N401 Enlarged prostate with lower urinary tract symptoms: Secondary | ICD-10-CM

## 2022-11-22 DIAGNOSIS — R739 Hyperglycemia, unspecified: Secondary | ICD-10-CM

## 2022-11-22 DIAGNOSIS — G4733 Obstructive sleep apnea (adult) (pediatric): Secondary | ICD-10-CM | POA: Insufficient documentation

## 2022-11-22 DIAGNOSIS — J45909 Unspecified asthma, uncomplicated: Secondary | ICD-10-CM

## 2022-11-22 DIAGNOSIS — N529 Male erectile dysfunction, unspecified: Secondary | ICD-10-CM

## 2022-11-22 DIAGNOSIS — I10 Essential (primary) hypertension: Secondary | ICD-10-CM

## 2022-11-22 HISTORY — DX: Obstructive sleep apnea (adult) (pediatric): G47.33

## 2022-11-22 MED ORDER — AMLODIPINE BESYLATE 10 MG PO TABS
10.0000 mg | ORAL_TABLET | Freq: Every day | ORAL | 1 refills | Status: DC
Start: 2022-11-22 — End: 2023-07-23

## 2022-11-22 MED ORDER — TAMSULOSIN HCL 0.4 MG PO CAPS: 0.4000 mg | ORAL_CAPSULE | Freq: Every day | ORAL | 1 refills | Status: DC

## 2022-11-22 MED ORDER — LOSARTAN POTASSIUM-HCTZ 100-25 MG PO TABS: 1.0000 | ORAL_TABLET | Freq: Every day | ORAL | 1 refills | Status: DC

## 2022-11-22 MED ORDER — METOPROLOL SUCCINATE ER 25 MG PO TB24: ORAL_TABLET | ORAL | 1 refills | Status: DC

## 2022-11-22 MED ORDER — SILDENAFIL CITRATE 20 MG PO TABS
ORAL_TABLET | ORAL | 5 refills | Status: AC
Start: 2022-11-22 — End: ?

## 2022-11-22 MED ORDER — PRAVASTATIN SODIUM 40 MG PO TABS
ORAL_TABLET | ORAL | 1 refills | Status: DC
Start: 2022-11-22 — End: 2023-07-05

## 2022-11-22 NOTE — Assessment & Plan Note (Signed)
Tolerating CPAP 

## 2022-11-22 NOTE — Assessment & Plan Note (Signed)
Lab Results  Component Value Date   HGBA1C 6.1 06/18/2022   Diet controlled. Will update A1C.

## 2022-11-22 NOTE — Assessment & Plan Note (Signed)
BP Readings from Last 3 Encounters:  11/22/22 (!) 127/51  11/18/22 (!) 144/64  07/23/22 126/62   BP looks good on amlodipine,

## 2022-11-22 NOTE — Assessment & Plan Note (Signed)
Stable on prilosec. 

## 2022-11-22 NOTE — Assessment & Plan Note (Signed)
Voiding without difficulty on Flomax.

## 2022-11-22 NOTE — Progress Notes (Signed)
Subjective:   By signing my name below, I, Aaron Ruiz, attest that this documentation has been prepared under the direction and in the presence of Aaron Fillers, NP 11/23/22   Patient ID: Aaron Ruiz, male    DOB: 09-02-1955, 67 y.o.   MRN: [id number]  Chief Complaint  Patient presents with   Hypertension    Here for follow up    Hypertension Associated symptoms include palpitations.   Patient is in today for a prescription refill.   Hypertension: His blood pressure is slightly elevated during this visit at 127/51.   Palpitations: He recently followed up with Dr. Elberta Ruiz on 5/20. He states he had more episodes of A-fib since his ablation than prior. His flecainide was increased from 50 mg to 100 mg. He has been tolerating this increased dose well.   Sleep apnea: He started using his CPAP on 4/25. His at home sleep study revealed he averaged 9-10 apneas an hour, which has since improved to less than 1 apnea per hour on average.   GERD:  His acid reflux is well managed with 40 mg Omeprazole daily.   Past Medical History:  Diagnosis Date   Actinic keratosis 03/20/2009   Qualifier: Diagnosis of  By: Artist Pais DO, D. Robert    Allergic conjunctivitis 06/30/2018   Anemia    ANEMIA-NOS 02/06/2009   Qualifier: Diagnosis of  By: Terrilee Croak CMA, [name]     Asthma    uses inhaler   Basal cell carcinoma 2017   left side of nose   Basal cell carcinoma of nose 10/16/2020   Borderline diabetes 05/03/2011   BPH (benign prostatic hyperplasia) 07/29/2014   Chemosis of conjunctiva of both eyes 01/15/2017   Colon polyps    Daytime somnolence 09/21/2020   Diverticulosis    Essential hypertension 02/06/2009   Qualifier: Diagnosis of  By: Terrilee Croak CMA, [name]      Fatigue 09/21/2020   Former smoker 09/21/2020   General medical examination 11/14/2010   GERD 02/06/2009   Qualifier: Diagnosis of  By: Terrilee Croak CMA, [name]     GERD (gastroesophageal reflux disease)    on meds   High cholesterol  02/06/2009   Qualifier: Diagnosis of  By: Terrilee Croak CMA, [name]     Hx of adenomatous colonic polyps 03/07/2009   Hx of eye surgery 2018   "Left Eye was swollen and had procedure to remove excess swelling"   Hyperglycemia 05/01/2011   Hyperlipidemia    Hypertension    Hypogonadism male 04/30/2011   Lactose intolerance    Lentigo 10/16/2020   Mild intermittent asthma 06/30/2018   Neoplasm of uncertain behavior of skin 10/16/2020   Obesity (BMI 30-39.9) 09/21/2020   OSA (obstructive sleep apnea) 11/22/2022   Osteoarthritis 09/24/2011   OVERWEIGHT 02/06/2009   Qualifier: Diagnosis of  By: Nena Jordan    Palpitations 09/21/2020   Paroxysmal atrial fibrillation (HCC) 10/06/2020   Rash/urticaria 08/07/2012   Recurrent UTI 07/30/2013   Routine general medical examination at a health care facility 12/16/2011   Secondary hypercoagulable state (HCC) 10/06/2020   SKIN LESION 03/20/2009   Qualifier: Diagnosis of  By: Nena Jordan    Snoring 09/21/2020   Unspecified asthma(493.90) 02/06/2009   Centricity Description: ASTHMA Qualifier: Diagnosis of  By: Terrilee Croak CMA, [name]   Centricity Description: ASTHMA, INTERMITTENT, MILD Qualifier: Diagnosis of  By: Peggyann Juba FNP, Bryonna Sundby S    URI (upper respiratory infection) 07/23/2011   Vitamin D deficiency 09/21/2020    Past Surgical  History:  Procedure Laterality Date   ATRIAL FIBRILLATION ABLATION N/A 02/08/2022   Procedure: ATRIAL FIBRILLATION ABLATION;  Surgeon: Aaron Lemming, MD;  Location: [facility];  Service: Cardiovascular;  Laterality: N/A;   CYSTECTOMY  05/2004   removed fromback /sebaceous cyst   INGUINAL HERNIA REPAIR  1995   left   mohs procedure  2017   left side of nose   NASAL SEPTUM SURGERY      Family History  Problem Relation Age of Onset   Coronary artery disease Father        CABG X5   Diabetes Father    Colon cancer Maternal Aunt    Cancer Brother        lung (smoker)   Hypertension Other    Kidney disease Other      Social History   Socioeconomic History   Marital status: Married    Spouse name: Not on file   Number of children: Not on file   Years of education: Not on file   Highest education level: Some college, no degree  Occupational History   Not on file  Tobacco Use   Smoking status: Former    Types: Cigarettes    Start date: 07/01/1969    Quit date: 07/01/1974    Years since quitting: 48.4   Smokeless tobacco: Never   Tobacco comments:    Former smoker 03/08/22  Vaping Use   Vaping Use: Never used  Substance and Sexual Activity   Alcohol use: No    Alcohol/week: 0.0 standard drinks of alcohol   Drug use: No   Sexual activity: Not on file  Other Topics Concern   Not on file  Social History Narrative   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[employer])   Married 33 years    1  daughter 7 - [name] (lives in [place])   1  son 37    Retired   Alcohol use-no   Smoking Status:  quit   Caffeine use/day:  2-3 beverages daily   Social Determinants of Health   Financial Resource Strain: Low Risk  (11/22/2022)   Overall Financial Resource Strain (CARDIA)    Difficulty of Paying Living Expenses: Not hard at all  Food Insecurity: No Food Insecurity (11/22/2022)   Hunger Vital Sign    Worried About Running Out of Food in the Last Year: Never true    Ran Out of Food in the Last Year: Never true  Transportation Needs: No Transportation Needs (11/22/2022)   PRAPARE - Administrator, Civil Service (Medical): No    Lack of Transportation (Non-Medical): No  Physical Activity: Sufficiently Active (11/22/2022)   Exercise Vital Sign    Days of Exercise per Week: 5 days    Minutes of Exercise per Session: 60 min  Stress: No Stress Concern Present (11/22/2022)   [employer] of Occupational Health - Occupational Stress Questionnaire    Feeling of Stress : Not at all  Social Connections: Socially Integrated (11/22/2022)   Social Connection and Isolation Panel [NHANES]    Frequency of  Communication with Friends and Family: Three times a week    Frequency of Social Gatherings with Friends and Family: Once a week    Attends Religious Services: More than 4 times per year    Active Member of [employer] or Organizations: Yes    Attends Engineer, structural: More than 4 times per year    Marital Status: Married  Catering manager Violence: Not At  Risk (05/18/2021)   Humiliation, Afraid, Rape, and Kick questionnaire    Fear of Current or Ex-Partner: No    Emotionally Abused: No    Physically Abused: No    Sexually Abused: No    Outpatient Medications Prior to Visit  Medication Sig Dispense Refill   acetaminophen (TYLENOL) 500 MG tablet Take 1 tablet (500 mg total) by mouth every 6 (six) hours as needed. (Patient taking differently: Take 500 mg by mouth in the morning and at bedtime.) 30 tablet 5   albuterol (VENTOLIN HFA) 108 (90 Base) MCG/ACT inhaler Inhale 2 puffs into the lungs every 6 (six) hours as needed for wheezing or shortness of breath. 8 g 0   apixaban (ELIQUIS) 5 MG TABS tablet TAKE 1 TABLET(5 MG) BY MOUTH TWICE DAILY 60 tablet 5   cetirizine (ZYRTEC) 10 MG tablet Take 10 mg by mouth daily as needed for allergies.     Cholecalciferol (VITAMIN D) 50 MCG (2000 UT) tablet Take 2,000 Units by mouth daily.     diphenhydrAMINE (BENADRYL) 25 MG tablet Take 25 mg by mouth at bedtime.     flecainide (TAMBOCOR) 100 MG tablet Take 1 tablet (100 mg total) by mouth 2 (two) times daily. 180 tablet 2   GLUCOSAMINE-CHONDROITIN PO Take 1 tablet by mouth in the morning and at bedtime.     guaiFENesin (MUCINEX) 600 MG 12 hr tablet Take 600 mg by mouth at bedtime.     Omega-3 Fatty Acids (FISH OIL) 1200 MG CAPS Take 1,200 mg by mouth daily.     omeprazole (PRILOSEC) 40 MG capsule TAKE 1 CAPSULE(40 MG) BY MOUTH DAILY 90 capsule 1   Turmeric Curcumin 500 MG CAPS Take 500 mg by mouth daily.     amLODipine (NORVASC) 10 MG tablet Take 1 tablet (10 mg total) by mouth daily. 90 tablet 1    losartan-hydrochlorothiazide (HYZAAR) 100-25 MG tablet Take 1 tablet by mouth daily. 90 tablet 1   metoprolol succinate (TOPROL-XL) 25 MG 24 hr tablet 1 daily 90 tablet 1   pravastatin (PRAVACHOL) 40 MG tablet TAKE 1 TABLET(40 MG) BY MOUTH DAILY 90 tablet 1   sildenafil (REVATIO) 20 MG tablet 1-2 tabs by mouth prior to sexual activity 30 tablet 5   tamsulosin (FLOMAX) 0.4 MG CAPS capsule Take 1 capsule (0.4 mg total) by mouth daily. 90 capsule 1   No facility-administered medications prior to visit.    Allergies  Allergen Reactions   Sulfonamide Derivatives     Hallucination (Childhood Reaction)    Review of Systems  Cardiovascular:  Positive for palpitations.       Objective:    Physical Exam Constitutional:      General: He is not in acute distress.    Appearance: He is well-developed.  HENT:     Head: Normocephalic and atraumatic.  Cardiovascular:     Rate and Rhythm: Normal rate and regular rhythm.     Heart sounds: No murmur heard. Pulmonary:     Effort: Pulmonary effort is normal. No respiratory distress.     Breath sounds: Normal breath sounds. No wheezing or rales.  Skin:    General: Skin is warm and dry.  Neurological:     Mental Status: He is alert and oriented to person, place, and time.  Psychiatric:        Behavior: Behavior normal.        Thought Content: Thought content normal.     BP (!) 127/51 (BP Location: Right Arm, Patient Position: Sitting,  Cuff Size: Large)   Pulse (!) 58   Temp 98.2 F (36.8 C) (Oral)   Resp 16   Wt 208 lb (94.3 kg)   SpO2 97%   BMI 32.58 kg/m  Wt Readings from Last 3 Encounters:  11/22/22 208 lb (94.3 kg)  11/18/22 208 lb (94.3 kg)  07/23/22 211 lb 9.6 oz (96 kg)       Assessment & Plan:  OSA (obstructive sleep apnea) Assessment & Plan: Tolerating CPAP.     Hyperglycemia Assessment & Plan: Lab Results  Component Value Date   HGBA1C 6.1 06/18/2022   Diet controlled. Will update A1C.    Orders: -      Hemoglobin A1c  Essential hypertension Assessment & Plan: BP Readings from Last 3 Encounters:  11/22/22 (!) 127/51  11/18/22 (!) 144/64  07/23/22 126/62   BP looks good on amlodipine,   Orders: -     Comprehensive metabolic panel -     Metoprolol Succinate ER; 1 daily  Dispense: 90 tablet; Refill: 1 -     Losartan Potassium-HCTZ; Take 1 tablet by mouth daily.  Dispense: 90 tablet; Refill: 1 -     amLODIPine Besylate; Take 1 tablet (10 mg total) by mouth daily.  Dispense: 90 tablet; Refill: 1  Uncomplicated asthma, unspecified asthma severity, unspecified whether persistent Assessment & Plan: Reports symptoms are stable.  Has not needed inhaler.    Benign prostatic hyperplasia with lower urinary tract symptoms, symptom details unspecified Assessment & Plan: Voiding without difficulty on Flomax.   Orders: -     Tamsulosin HCl; Take 1 capsule (0.4 mg total) by mouth daily.  Dispense: 90 capsule; Refill: 1  Gastroesophageal reflux disease, unspecified whether esophagitis present Assessment & Plan: Stable on prilosec.    Mixed hyperlipidemia -     Lipid panel -     Pravastatin Sodium; TAKE 1 TABLET(40 MG) BY MOUTH DAILY  Dispense: 90 tablet; Refill: 1  Erectile dysfunction, unspecified erectile dysfunction type -     Sildenafil Citrate; 1-2 tabs by mouth prior to sexual activity  Dispense: 30 tablet; Refill: 5     I,Rachel Rivera,acting as a scribe for Aaron Fillers, NP.,have documented all relevant documentation on the behalf of Aaron Fillers, NP,as directed by  Aaron Fillers, NP while in the presence of Aaron Fillers, NP.   I, Aaron Fillers, NP, personally preformed the services described in this documentation.  All medical record entries made by the scribe were at my direction and in my presence.  I have reviewed the chart and discharge instructions (if applicable) and agree that the record reflects my personal performance and is  accurate and complete. 11/23/22   Aaron Fillers, NP

## 2022-11-22 NOTE — Assessment & Plan Note (Signed)
Reports symptoms are stable.  Has not needed inhaler.

## 2022-11-23 ENCOUNTER — Other Ambulatory Visit (HOSPITAL_COMMUNITY): Payer: Self-pay

## 2022-11-23 DIAGNOSIS — G4733 Obstructive sleep apnea (adult) (pediatric): Secondary | ICD-10-CM | POA: Diagnosis not present

## 2022-11-23 LAB — COMPREHENSIVE METABOLIC PANEL
AG Ratio: 2 (calc) (ref 1.0–2.5)
ALT: 14 U/L (ref 9–46)
AST: 13 U/L (ref 10–35)
Albumin: 4.5 g/dL (ref 3.6–5.1)
Alkaline phosphatase (APISO): 54 U/L (ref 35–144)
BUN: 19 mg/dL (ref 7–25)
CO2: 29 mmol/L (ref 20–32)
Calcium: 9.4 mg/dL (ref 8.6–10.3)
Chloride: 105 mmol/L (ref 98–110)
Creat: 1.08 mg/dL (ref 0.70–1.35)
Globulin: 2.3 g/dL (calc) (ref 1.9–3.7)
Glucose, Bld: 99 mg/dL (ref 65–99)
Potassium: 4.7 mmol/L (ref 3.5–5.3)
Sodium: 141 mmol/L (ref 135–146)
Total Bilirubin: 0.5 mg/dL (ref 0.2–1.2)
Total Protein: 6.8 g/dL (ref 6.1–8.1)

## 2022-11-23 LAB — LIPID PANEL
Cholesterol: 176 mg/dL (ref <200)
HDL: 42 mg/dL (ref >=40)
LDL Cholesterol (Calc): 104 mg/dL (calc) — ABNORMAL HIGH
Non-HDL Cholesterol (Calc): 134 mg/dL (calc) — ABNORMAL HIGH (ref <130)
Total CHOL/HDL Ratio: 4.2 (calc) (ref <5.0)
Triglycerides: 182 mg/dL — ABNORMAL HIGH (ref <150)

## 2022-11-23 LAB — HEMOGLOBIN A1C
Hgb A1c MFr Bld: 6.1 % of total Hgb — ABNORMAL HIGH (ref <5.7)
Mean Plasma Glucose: 128 mg/dL
eAG (mmol/L): 7.1 mmol/L

## 2022-11-30 ENCOUNTER — Encounter: Payer: Self-pay | Admitting: Cardiology

## 2022-12-02 MED ORDER — FLECAINIDE ACETATE 150 MG PO TABS: 150.0000 mg | ORAL_TABLET | Freq: Two times a day (BID) | ORAL | Status: DC

## 2022-12-09 ENCOUNTER — Ambulatory Visit
Admission: RE | Admit: 2022-12-09 | Discharge: 2022-12-09 | Disposition: A | Discharge status: Home / Self Care | Payer: Medicare Other | Source: Ambulatory Visit | Attending: Internal Medicine | Admitting: Internal Medicine

## 2022-12-09 ENCOUNTER — Telehealth: Payer: Self-pay | Admitting: Cardiology

## 2022-12-09 VITALS — BP 122/68 | HR 102 | Wt 205.6 lb

## 2022-12-09 DIAGNOSIS — Z79899 Other long term (current) drug therapy: Secondary | ICD-10-CM | POA: Insufficient documentation

## 2022-12-09 DIAGNOSIS — Z6832 Body mass index (BMI) 32.0-32.9, adult: Secondary | ICD-10-CM | POA: Diagnosis not present

## 2022-12-09 DIAGNOSIS — Z87891 Personal history of nicotine dependence: Secondary | ICD-10-CM | POA: Insufficient documentation

## 2022-12-09 DIAGNOSIS — Z713 Dietary counseling and surveillance: Secondary | ICD-10-CM | POA: Diagnosis not present

## 2022-12-09 DIAGNOSIS — I4892 Unspecified atrial flutter: Secondary | ICD-10-CM | POA: Diagnosis not present

## 2022-12-09 DIAGNOSIS — I480 Paroxysmal atrial fibrillation: Secondary | ICD-10-CM | POA: Insufficient documentation

## 2022-12-09 DIAGNOSIS — Z5181 Encounter for therapeutic drug level monitoring: Secondary | ICD-10-CM

## 2022-12-09 DIAGNOSIS — Z7901 Long term (current) use of anticoagulants: Secondary | ICD-10-CM | POA: Diagnosis not present

## 2022-12-09 DIAGNOSIS — E785 Hyperlipidemia, unspecified: Secondary | ICD-10-CM | POA: Diagnosis not present

## 2022-12-09 DIAGNOSIS — D6869 Other thrombophilia: Secondary | ICD-10-CM | POA: Diagnosis not present

## 2022-12-09 DIAGNOSIS — E669 Obesity, unspecified: Secondary | ICD-10-CM | POA: Diagnosis not present

## 2022-12-09 DIAGNOSIS — I4819 Other persistent atrial fibrillation: Secondary | ICD-10-CM

## 2022-12-09 DIAGNOSIS — I10 Essential (primary) hypertension: Secondary | ICD-10-CM | POA: Insufficient documentation

## 2022-12-09 DIAGNOSIS — I4510 Unspecified right bundle-branch block: Secondary | ICD-10-CM | POA: Insufficient documentation

## 2022-12-09 LAB — CBC
HCT: 47 % (ref 39.0–52.0)
Hemoglobin: 15 g/dL (ref 13.0–17.0)
MCH: 30.1 pg (ref 26.0–34.0)
MCHC: 31.9 g/dL (ref 30.0–36.0)
MCV: 94.2 fL (ref 80.0–100.0)
Platelets: 183 10*3/uL (ref 150–400)
RBC: 4.99 MIL/uL (ref 4.22–5.81)
RDW: 13.8 % (ref 11.5–15.5)
WBC: 7.6 10*3/uL (ref 4.0–10.5)
nRBC: 0 % (ref 0.0–0.2)

## 2022-12-09 MED ORDER — FLECAINIDE ACETATE 100 MG PO TABS: 100.0000 mg | ORAL_TABLET | Freq: Two times a day (BID) | ORAL | Status: DC

## 2022-12-09 NOTE — Patient Instructions (Addendum)
Decrease flecainide back to 100mg  twice a day   Cardioversion scheduled for: Thursday, June 13th   - Arrive at the [facility] and go to admitting at 12pm   - Do not eat or drink anything after midnight the night prior to your procedure.   - Take all your morning medication (except diabetic medications) with a sip of water prior to arrival.  - You will not be able to drive home after your procedure.    - Do NOT miss any doses of your blood thinner - if you should miss a dose please notify our office immediately.   - If you feel as if you go back into normal rhythm prior to scheduled cardioversion, please notify our office immediately.   If your procedure is canceled in the cardioversion suite you will be charged a cancellation fee.

## 2022-12-09 NOTE — H&P (View-Only) (Signed)
  Primary Care Physician: O'Sullivan, [name], NP Primary Cardiologist: Dr [name] Primary Electrophysiologist: Dr Camnitz Referring Physician: [facility]   Aaron Ruiz is a 67 y.o. male with a history of HTN, HLD, atrial fibrillation who presents for follow up in the [facility]. The patient was initially diagnosed with atrial fibrillation 10/03/20 after presenting to [facility] with symptoms of tachypalpitations. He had just seen Dr [name] who placed a cardiac monitor to screen for arrhythmias. Patient was started on Eliquis for a CHADS2VASC score of 2. He was recently taken off of metoprolol due to bradycardia, this was resumed at the ED. He denies any significant alcohol use but does admit to snoring and some daytime somnolence. Patient was seen by Dr Camnitz and started on flecainide. He eventually underwent afib ablation 02/08/22.   On follow up today, patient reports that he has done well since his ablation with no episodes of afib. He denies CP, swallowing pain, or groin issues. No bleeding issues on anticoagulation.   On follow up 12/09/22, patient is currently in atrial flutter. His ECG is abnormal and shows wide QRS complexes. He reports to be out of rhythm since this past Friday. He feels lightheaded and a little SOB with minimal exertion. He notes previously episodes were always brief and would convert to NSR quickly; over past couple of months episodes have seemed to last longer. Flecainide increased to 150 mg BID on 6/3 per phone notes. He has not missed any doses of Eliquis.  Today, he denies symptoms of palpitations, chest pain, shortness of breath, orthopnea, PND, lower extremity edema, dizziness, presyncope, syncope, bleeding, or neurologic sequela. The patient is tolerating medications without difficulties and is otherwise without complaint today.    Atrial Fibrillation Risk Factors:  he does have symptoms or diagnosis of sleep  apnea. Patient declined sleep study. he does not have a history of rheumatic fever. he does not have a history of alcohol use. The patient does have a history of early familial atrial fibrillation or other arrhythmias. Sister has afib s/p ablation.  he has a BMI of Body mass index is 32.2 kg/m.. Filed Weights   12/09/22 1342  Weight: 93.3 kg     Family History  Problem Relation Age of Onset   Coronary artery disease Father        CABG X5   Diabetes Father    Colon cancer Maternal Aunt    Cancer Brother        lung (smoker)   Hypertension Other    Kidney disease Other     Atrial Fibrillation Management history:  Previous antiarrhythmic drugs: flecainide  Previous cardioversions: none Previous ablations: 02/08/22 CHADS2VASC score: 2 Anticoagulation history: Eliquis   Past Medical History:  Diagnosis Date   Actinic keratosis 03/20/2009   Qualifier: Diagnosis of  By: Yoo DO, D. Robert    Allergic conjunctivitis 06/30/2018   Anemia    ANEMIA-NOS 02/06/2009   Qualifier: Diagnosis of  By: Knight CMA, [name]     Asthma    uses inhaler   Basal cell carcinoma 2017   left side of nose   Basal cell carcinoma of nose 10/16/2020   Borderline diabetes 05/03/2011   BPH (benign prostatic hyperplasia) 07/29/2014   Chemosis of conjunctiva of both eyes 01/15/2017   Colon polyps    Daytime somnolence 09/21/2020   Diverticulosis    Essential hypertension 02/06/2009   Qualifier: Diagnosis of  By: Knight CMA, [name]        Fatigue 09/21/2020   Former smoker 09/21/2020   General medical examination 11/14/2010   GERD 02/06/2009   Qualifier: Diagnosis of  By: Knight CMA, [name]     GERD (gastroesophageal reflux disease)    on meds   High cholesterol 02/06/2009   Qualifier: Diagnosis of  By: Knight CMA, [name]     Hx of adenomatous colonic polyps 03/07/2009   Hx of eye surgery 2018   "Left Eye was swollen and had procedure to remove excess swelling"   Hyperglycemia 05/01/2011   Hyperlipidemia     Hypertension    Hypogonadism male 04/30/2011   Lactose intolerance    Lentigo 10/16/2020   Mild intermittent asthma 06/30/2018   Neoplasm of uncertain behavior of skin 10/16/2020   Obesity (BMI 30-39.9) 09/21/2020   OSA (obstructive sleep apnea) 11/22/2022   Osteoarthritis 09/24/2011   OVERWEIGHT 02/06/2009   Qualifier: Diagnosis of  By: Yoo DO, D. Robert    Palpitations 09/21/2020   Paroxysmal atrial fibrillation (HCC) 10/06/2020   Rash/urticaria 08/07/2012   Recurrent UTI 07/30/2013   Routine general medical examination at a health care facility 12/16/2011   Secondary hypercoagulable state (HCC) 10/06/2020   SKIN LESION 03/20/2009   Qualifier: Diagnosis of  By: Yoo DO, D. Robert    Snoring 09/21/2020   Unspecified asthma(493.90) 02/06/2009   Centricity Description: ASTHMA Qualifier: Diagnosis of  By: Knight CMA, [name]   Centricity Description: ASTHMA, INTERMITTENT, MILD Qualifier: Diagnosis of  By: O'Sullivan FNP, [name] S    URI (upper respiratory infection) 07/23/2011   Vitamin D deficiency 09/21/2020   Past Surgical History:  Procedure Laterality Date   ATRIAL FIBRILLATION ABLATION N/A 02/08/2022   Procedure: ATRIAL FIBRILLATION ABLATION;  Surgeon: Camnitz, Will Martin, MD;  Location: [facility];  Service: Cardiovascular;  Laterality: N/A;   CYSTECTOMY  05/2004   removed fromback /sebaceous cyst   INGUINAL HERNIA REPAIR  1995   left   mohs procedure  2017   left side of nose   NASAL SEPTUM SURGERY      Current Outpatient Medications  Medication Sig Dispense Refill   acetaminophen (TYLENOL) 500 MG tablet Take 1 tablet (500 mg total) by mouth every 6 (six) hours as needed. (Patient taking differently: Take 500 mg by mouth as needed.) 30 tablet 5   albuterol (VENTOLIN HFA) 108 (90 Base) MCG/ACT inhaler Inhale 2 puffs into the lungs every 6 (six) hours as needed for wheezing or shortness of breath. 8 g 0   amLODipine (NORVASC) 10 MG tablet Take 1 tablet (10 mg total) by mouth daily.  90 tablet 1   apixaban (ELIQUIS) 5 MG TABS tablet TAKE 1 TABLET(5 MG) BY MOUTH TWICE DAILY 60 tablet 5   cetirizine (ZYRTEC) 10 MG tablet Take 10 mg by mouth daily.     Cholecalciferol (VITAMIN D) 50 MCG (2000 UT) tablet Take 2,000 Units by mouth daily.     diphenhydrAMINE (BENADRYL) 25 MG tablet Take 25 mg by mouth at bedtime.     guaiFENesin (MUCINEX) 600 MG 12 hr tablet Take 600 mg by mouth as needed.     losartan-hydrochlorothiazide (HYZAAR) 100-25 MG tablet Take 1 tablet by mouth daily. 90 tablet 1   metoprolol succinate (TOPROL-XL) 25 MG 24 hr tablet 1 daily 90 tablet 1   Omega-3 Fatty Acids (FISH OIL) 1200 MG CAPS Take 1,200 mg by mouth daily.     omeprazole (PRILOSEC) 40 MG capsule TAKE 1 CAPSULE(40 MG) BY MOUTH DAILY 90 capsule 1   pravastatin (PRAVACHOL)   40 MG tablet TAKE 1 TABLET(40 MG) BY MOUTH DAILY 90 tablet 1   sildenafil (REVATIO) 20 MG tablet 1-2 tabs by mouth prior to sexual activity 30 tablet 5   tamsulosin (FLOMAX) 0.4 MG CAPS capsule Take 1 capsule (0.4 mg total) by mouth daily. 90 capsule 1   Turmeric Curcumin 500 MG CAPS Take 500 mg by mouth daily.     flecainide (TAMBOCOR) 100 MG tablet Take 1 tablet (100 mg total) by mouth 2 (two) times daily.     GLUCOSAMINE-CHONDROITIN PO Take 1 tablet by mouth in the morning and at bedtime. (Patient not taking: Reported on 12/09/2022)     No current facility-administered medications for this encounter.    Allergies  Allergen Reactions   Sulfonamide Derivatives     Hallucination (Childhood Reaction)    Social History   Socioeconomic History   Marital status: Married    Spouse name: Not on file   Number of children: Not on file   Years of education: Not on file   Highest education level: Some college, no degree  Occupational History   Not on file  Tobacco Use   Smoking status: Former    Types: Cigarettes    Start date: 07/01/1969    Quit date: 07/01/1974    Years since quitting: 48.4   Smokeless tobacco: Never   Tobacco  comments:    Former smoker 03/08/22  Vaping Use   Vaping Use: Never used  Substance and Sexual Activity   Alcohol use: No    Alcohol/week: 0.0 standard drinks of alcohol   Drug use: No   Sexual activity: Not on file  Other Topics Concern   Not on file  Social History Narrative   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[employer])   Married 33 years    1  daughter 35 - Nanny (lives in [place])   1  son 27    Retired   Alcohol use-no   Smoking Status:  quit   Caffeine use/day:  2-3 beverages daily   Social Determinants of Health   Financial Resource Strain: Low Risk  (11/22/2022)   Overall Financial Resource Strain (CARDIA)    Difficulty of Paying Living Expenses: Not hard at all  Food Insecurity: No Food Insecurity (11/22/2022)   Hunger Vital Sign    Worried About Running Out of Food in the Last Year: Never true    Ran Out of Food in the Last Year: Never true  Transportation Needs: No Transportation Needs (11/22/2022)   PRAPARE - Transportation    Lack of Transportation (Medical): No    Lack of Transportation (Non-Medical): No  Physical Activity: Sufficiently Active (11/22/2022)   Exercise Vital Sign    Days of Exercise per Week: 5 days    Minutes of Exercise per Session: 60 min  Stress: No Stress Concern Present (11/22/2022)   [employer] - Occupational Stress Questionnaire    Feeling of Stress : Not at all  Social Connections: Socially Integrated (11/22/2022)   Social Connection and Isolation Panel [NHANES]    Frequency of Communication with Friends and Family: Three times a week    Frequency of Social Gatherings with Friends and Family: Once a week    Attends Religious Services: More than 4 times per year    Active Member of Clubs or Organizations: Yes    Attends Club or Organization Meetings: More than 4 times per year    Marital Status: Married  Intimate Partner Violence: Not At Risk (05/18/2021)     Humiliation, Afraid, Rape, and Kick questionnaire     Fear of Current or Ex-Partner: No    Emotionally Abused: No    Physically Abused: No    Sexually Abused: No     ROS- All systems are reviewed and negative except as per the HPI above.  Physical Exam: Vitals:   12/09/22 1342  BP: 122/68  Pulse: (!) 102  Weight: 93.3 kg    GEN- The patient is well appearing, alert and oriented x 3 today.   Head- normocephalic, atraumatic Eyes-  Sclera clear, conjunctiva pink Ears- hearing intact Lungs- Clear to ausculation bilaterally, normal work of breathing Heart- Irregular rate and rhythm, no murmurs, rubs or gallops, PMI not laterally displaced Extremities- no clubbing, cyanosis, or edema MS- no significant deformity or atrophy Skin- no rash or lesion Psych- euthymic mood, full affect Neuro- strength and sensation are intact   Wt Readings from Last 3 Encounters:  12/09/22 93.3 kg  11/22/22 94.3 kg  11/18/22 94.3 kg    EKG today demonstrates  Vent. rate 102 BPM PR interval * ms QRS duration 176 ms QT/QTcB 432/563 ms P-R-T axes * -48 -9 Atrial flutter with variable A-V block Left axis deviation Right bundle branch block Abnormal ECG When compared with ECG of 08-Mar-2022 10:43, PREVIOUS ECG IS PRESENT  Echo 11/01/20 1. Left ventricular ejection fraction, by estimation, is 60 to 65%. The  left ventricle has normal function. The left ventricle has no regional  wall motion abnormalities. Left ventricular diastolic parameters were  normal.   2. Right ventricular systolic function is normal. The right ventricular  size is normal.   3. The mitral valve is normal in structure. Trivial mitral valve  regurgitation. No evidence of mitral stenosis.   4. The aortic valve is normal in structure. Aortic valve regurgitation is  not visualized. No aortic stenosis is present.   5. The inferior vena cava is normal in size with greater than 50%  respiratory variability, suggesting right atrial pressure of 3 mmHg.   Epic records are  reviewed at length today  CHA2DS2-VASc Score = 2  The patient's score is based upon: CHF History: 0 HTN History: 1 Diabetes History: 0 Stroke History: 0 Vascular Disease History: 0 Age Score: 1 Gender Score: 0      ASSESSMENT AND PLAN: 1. Paroxysmal Atrial Fibrillation (ICD10:  I48.0) The patient's CHA2DS2-VASc score is 2, indicating a 2.2% annual risk of stroke.   S/p afib ablation 02/08/22  Patient appears to be in atrial flutter with variable block. Review of case with EP given wide QRS complexes which is new compared to previous tracing. After discussion, we will do the following plan:  - Lower flecainide dose to 100 mg BID - Schedule DCCV.  - F/u 1-2 weeks after DCCV.  If medication does not help keep him in rhythm, would have to consider other long term option. At this point, medication options would include tikosyn or amiodarone. He would have to stop taking benadryl and HCTZ if starts tikosyn. Due to age, it may be less desirable to be on amiodarone long term. Qtc 409 ms in NSR.  2. Secondary Hypercoagulable State (ICD10:  D68.69) The patient is at significant risk for stroke/thromboembolism based upon his CHA2DS2-VASc Score of 2.  Continue Apixaban (Eliquis).  No missed doses.   3. Obesity Body mass index is 32.2 kg/m. Lifestyle modification was discussed and encouraged including regular physical activity and weight reduction.  4. HTN Stable, no changes today.   Follow up   1-2 weeks after DCCV.   Shem Juanantonio Stolar, PA-C [facility] [address] [place], [place] [address] [phone] 12/09/2022 3:15 PM 

## 2022-12-09 NOTE — Telephone Encounter (Signed)
Pt reports that he has been in afib since Friday. Denies CP, SOB. However he is experiencing light headedness and heart pounding. Pt scheduled to see AFib clinic this afternoon for further evaluation. Patient verbalized understanding and agreeable to plan.

## 2022-12-09 NOTE — Telephone Encounter (Signed)
Pt called to report that he has been in Afib constantly since this pats Friday... he say his rate has been up and down.. up to 150 once on Saturday.. his rate today is 90-100... BP has been 128/66,145/70.   He has been light headed all weekend and a little SOB with minimal exertion.   He has been taking all fo this meds.   Will forward to Dr Nobie Putnam for review.   Pt agrees if anything worsens this morning he will have someone take him to the ED.   Nurse Visit 12/10/22 for increased Flecainide dose.

## 2022-12-09 NOTE — Progress Notes (Signed)
Primary Care Physician: Sandford Craze, NP Primary Cardiologist: Dr Servando Salina Primary Electrophysiologist: Dr Elberta Fortis Referring Physician: [facility]   Aaron Ruiz is a 67 y.o. male with a history of HTN, HLD, atrial fibrillation who presents for follow up in the [facility]. The patient was initially diagnosed with atrial fibrillation 10/03/20 after presenting to [facility] ED with symptoms of tachypalpitations. He had just seen Dr Servando Salina who placed a cardiac monitor to screen for arrhythmias. Patient was started on Eliquis for a CHADS2VASC score of 2. He was recently taken off of metoprolol due to bradycardia, this was resumed at the ED. He denies any significant alcohol use but does admit to snoring and some daytime somnolence. Patient was seen by Dr Elberta Fortis and started on flecainide. He eventually underwent afib ablation 02/08/22.   On follow up today, patient reports that he has done well since his ablation with no episodes of afib. He denies CP, swallowing pain, or groin issues. No bleeding issues on anticoagulation.   On follow up 12/09/22, patient is currently in atrial flutter. His ECG is abnormal and shows wide QRS complexes. He reports to be out of rhythm since this past Friday. He feels lightheaded and a little SOB with minimal exertion. He notes previously episodes were always brief and would convert to NSR quickly; over past couple of months episodes have seemed to last longer. Flecainide increased to 150 mg BID on 6/3 per phone notes. He has not missed any doses of Eliquis.  Today, he denies symptoms of palpitations, chest pain, shortness of breath, orthopnea, PND, lower extremity edema, dizziness, presyncope, syncope, bleeding, or neurologic sequela. The patient is tolerating medications without difficulties and is otherwise without complaint today.    Atrial Fibrillation Risk Factors:  he does have symptoms or diagnosis of sleep  apnea. Patient declined sleep study. he does not have a history of rheumatic fever. he does not have a history of alcohol use. The patient does have a history of early familial atrial fibrillation or other arrhythmias. Sister has afib s/p ablation.  he has a BMI of Body mass index is 32.2 kg/m.[name] Filed Weights   12/09/22 1342  Weight: 93.3 kg     Family History  Problem Relation Age of Onset   Coronary artery disease Father        CABG X5   Diabetes Father    Colon cancer Maternal Aunt    Cancer Brother        lung (smoker)   Hypertension Other    Kidney disease Other     Atrial Fibrillation Management history:  Previous antiarrhythmic drugs: flecainide  Previous cardioversions: none Previous ablations: 02/08/22 CHADS2VASC score: 2 Anticoagulation history: Eliquis   Past Medical History:  Diagnosis Date   Actinic keratosis 03/20/2009   Qualifier: Diagnosis of  By: Artist Pais DO, Barbette Hair    Allergic conjunctivitis 06/30/2018   Anemia    ANEMIA-NOS 02/06/2009   Qualifier: Diagnosis of  By: Terrilee Croak CMA, [name]     Asthma    uses inhaler   Basal cell carcinoma 2017   left side of nose   Basal cell carcinoma of nose 10/16/2020   Borderline diabetes 05/03/2011   BPH (benign prostatic hyperplasia) 07/29/2014   Chemosis of conjunctiva of both eyes 01/15/2017   Colon polyps    Daytime somnolence 09/21/2020   Diverticulosis    Essential hypertension 02/06/2009   Qualifier: Diagnosis of  By: Terrilee Croak CMA, [name]  Fatigue 09/21/2020   Former smoker 09/21/2020   General medical examination 11/14/2010   GERD 02/06/2009   Qualifier: Diagnosis of  By: Terrilee Croak CMA, [name]     GERD (gastroesophageal reflux disease)    on meds   High cholesterol 02/06/2009   Qualifier: Diagnosis of  By: Terrilee Croak CMA, [name]     Hx of adenomatous colonic polyps 03/07/2009   Hx of eye surgery 2018   "Left Eye was swollen and had procedure to remove excess swelling"   Hyperglycemia 05/01/2011   Hyperlipidemia     Hypertension    Hypogonadism male 04/30/2011   Lactose intolerance    Lentigo 10/16/2020   Mild intermittent asthma 06/30/2018   Neoplasm of uncertain behavior of skin 10/16/2020   Obesity (BMI 30-39.9) 09/21/2020   OSA (obstructive sleep apnea) 11/22/2022   Osteoarthritis 09/24/2011   OVERWEIGHT 02/06/2009   Qualifier: Diagnosis of  By: Nena Jordan    Palpitations 09/21/2020   Paroxysmal atrial fibrillation (HCC) 10/06/2020   Rash/urticaria 08/07/2012   Recurrent UTI 07/30/2013   Routine general medical examination at a health care facility 12/16/2011   Secondary hypercoagulable state (HCC) 10/06/2020   SKIN LESION 03/20/2009   Qualifier: Diagnosis of  By: Nena Jordan    Snoring 09/21/2020   Unspecified asthma(493.90) 02/06/2009   Centricity Description: ASTHMA Qualifier: Diagnosis of  By: Terrilee Croak CMA, [name]   Centricity Description: ASTHMA, INTERMITTENT, MILD Qualifier: Diagnosis of  By: Peggyann Juba FNP, [name] S    URI (upper respiratory infection) 07/23/2011   Vitamin D deficiency 09/21/2020   Past Surgical History:  Procedure Laterality Date   ATRIAL FIBRILLATION ABLATION N/A 02/08/2022   Procedure: ATRIAL FIBRILLATION ABLATION;  Surgeon: Regan Lemming, MD;  Location: [facility] INVASIVE CV LAB;  Service: Cardiovascular;  Laterality: N/A;   CYSTECTOMY  05/2004   removed fromback /sebaceous cyst   INGUINAL HERNIA REPAIR  1995   left   mohs procedure  2017   left side of nose   NASAL SEPTUM SURGERY      Current Outpatient Medications  Medication Sig Dispense Refill   acetaminophen (TYLENOL) 500 MG tablet Take 1 tablet (500 mg total) by mouth every 6 (six) hours as needed. (Patient taking differently: Take 500 mg by mouth as needed.) 30 tablet 5   albuterol (VENTOLIN HFA) 108 (90 Base) MCG/ACT inhaler Inhale 2 puffs into the lungs every 6 (six) hours as needed for wheezing or shortness of breath. 8 g 0   amLODipine (NORVASC) 10 MG tablet Take 1 tablet (10 mg total) by mouth daily.  90 tablet 1   apixaban (ELIQUIS) 5 MG TABS tablet TAKE 1 TABLET(5 MG) BY MOUTH TWICE DAILY 60 tablet 5   cetirizine (ZYRTEC) 10 MG tablet Take 10 mg by mouth daily.     Cholecalciferol (VITAMIN D) 50 MCG (2000 UT) tablet Take 2,000 Units by mouth daily.     diphenhydrAMINE (BENADRYL) 25 MG tablet Take 25 mg by mouth at bedtime.     guaiFENesin (MUCINEX) 600 MG 12 hr tablet Take 600 mg by mouth as needed.     losartan-hydrochlorothiazide (HYZAAR) 100-25 MG tablet Take 1 tablet by mouth daily. 90 tablet 1   metoprolol succinate (TOPROL-XL) 25 MG 24 hr tablet 1 daily 90 tablet 1   Omega-3 Fatty Acids (FISH OIL) 1200 MG CAPS Take 1,200 mg by mouth daily.     omeprazole (PRILOSEC) 40 MG capsule TAKE 1 CAPSULE(40 MG) BY MOUTH DAILY 90 capsule 1   pravastatin (PRAVACHOL)  40 MG tablet TAKE 1 TABLET(40 MG) BY MOUTH DAILY 90 tablet 1   sildenafil (REVATIO) 20 MG tablet 1-2 tabs by mouth prior to sexual activity 30 tablet 5   tamsulosin (FLOMAX) 0.4 MG CAPS capsule Take 1 capsule (0.4 mg total) by mouth daily. 90 capsule 1   Turmeric Curcumin 500 MG CAPS Take 500 mg by mouth daily.     flecainide (TAMBOCOR) 100 MG tablet Take 1 tablet (100 mg total) by mouth 2 (two) times daily.     GLUCOSAMINE-CHONDROITIN PO Take 1 tablet by mouth in the morning and at bedtime. (Patient not taking: Reported on 12/09/2022)     No current facility-administered medications for this encounter.    Allergies  Allergen Reactions   Sulfonamide Derivatives     Hallucination (Childhood Reaction)    Social History   Socioeconomic History   Marital status: Married    Spouse name: Not on file   Number of children: Not on file   Years of education: Not on file   Highest education level: Some college, no degree  Occupational History   Not on file  Tobacco Use   Smoking status: Former    Types: Cigarettes    Start date: 07/01/1969    Quit date: 07/01/1974    Years since quitting: 48.4   Smokeless tobacco: Never   Tobacco  comments:    Former smoker 03/08/22  Vaping Use   Vaping Use: Never used  Substance and Sexual Activity   Alcohol use: No    Alcohol/week: 0.0 standard drinks of alcohol   Drug use: No   Sexual activity: Not on file  Other Topics Concern   Not on file  Social History Narrative   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[employer])   Married 33 years    1  daughter 40 - [name] (lives in [place])   1  son 37    Retired   Alcohol use-no   Smoking Status:  quit   Caffeine use/day:  2-3 beverages daily   Social Determinants of Health   Financial Resource Strain: Low Risk  (11/22/2022)   Overall Financial Resource Strain (CARDIA)    Difficulty of Paying Living Expenses: Not hard at all  Food Insecurity: No Food Insecurity (11/22/2022)   Hunger Vital Sign    Worried About Running Out of Food in the Last Year: Never true    Ran Out of Food in the Last Year: Never true  Transportation Needs: No Transportation Needs (11/22/2022)   PRAPARE - Administrator, Civil Service (Medical): No    Lack of Transportation (Non-Medical): No  Physical Activity: Sufficiently Active (11/22/2022)   Exercise Vital Sign    Days of Exercise per Week: 5 days    Minutes of Exercise per Session: 60 min  Stress: No Stress Concern Present (11/22/2022)   [employer] of Occupational Health - Occupational Stress Questionnaire    Feeling of Stress : Not at all  Social Connections: Socially Integrated (11/22/2022)   Social Connection and Isolation Panel [NHANES]    Frequency of Communication with Friends and Family: Three times a week    Frequency of Social Gatherings with Friends and Family: Once a week    Attends Religious Services: More than 4 times per year    Active Member of [employer] or Organizations: Yes    Attends Banker Meetings: More than 4 times per year    Marital Status: Married  Catering manager Violence: Not At Risk (05/18/2021)  Humiliation, Afraid, Rape, and Kick questionnaire     Fear of Current or Ex-Partner: No    Emotionally Abused: No    Physically Abused: No    Sexually Abused: No     ROS- All systems are reviewed and negative except as per the HPI above.  Physical Exam: Vitals:   12/09/22 1342  BP: 122/68  Pulse: (!) 102  Weight: 93.3 kg    GEN- The patient is well appearing, alert and oriented x 3 today.   Head- normocephalic, atraumatic Eyes-  Sclera clear, conjunctiva pink Ears- hearing intact Lungs- Clear to ausculation bilaterally, normal work of breathing Heart- Irregular rate and rhythm, no murmurs, rubs or gallops, PMI not laterally displaced Extremities- no clubbing, cyanosis, or edema MS- no significant deformity or atrophy Skin- no rash or lesion Psych- euthymic mood, full affect Neuro- strength and sensation are intact   Wt Readings from Last 3 Encounters:  12/09/22 93.3 kg  11/22/22 94.3 kg  11/18/22 94.3 kg    EKG today demonstrates  Vent. rate 102 BPM PR interval * ms QRS duration 176 ms QT/QTcB 432/563 ms P-R-T axes * -48 -9 Atrial flutter with variable A-V block Left axis deviation Right bundle branch block Abnormal ECG When compared with ECG of 08-Mar-2022 10:43, PREVIOUS ECG IS PRESENT  Echo 11/01/20 1. Left ventricular ejection fraction, by estimation, is 60 to 65%. The  left ventricle has normal function. The left ventricle has no regional  wall motion abnormalities. Left ventricular diastolic parameters were  normal.   2. Right ventricular systolic function is normal. The right ventricular  size is normal.   3. The mitral valve is normal in structure. Trivial mitral valve  regurgitation. No evidence of mitral stenosis.   4. The aortic valve is normal in structure. Aortic valve regurgitation is  not visualized. No aortic stenosis is present.   5. The inferior vena cava is normal in size with greater than 50%  respiratory variability, suggesting right atrial pressure of 3 mmHg.   Epic records are  reviewed at length today  CHA2DS2-VASc Score = 2  The patient's score is based upon: CHF History: 0 HTN History: 1 Diabetes History: 0 Stroke History: 0 Vascular Disease History: 0 Age Score: 1 Gender Score: 0      ASSESSMENT AND PLAN: 1. Paroxysmal Atrial Fibrillation (ICD10:  I48.0) The patient's CHA2DS2-VASc score is 2, indicating a 2.2% annual risk of stroke.   S/p afib ablation 02/08/22  Patient appears to be in atrial flutter with variable block. Review of case with EP given wide QRS complexes which is new compared to previous tracing. After discussion, we will do the following plan:  - Lower flecainide dose to 100 mg BID - Schedule DCCV.  - F/u 1-2 weeks after DCCV.  If medication does not help keep him in rhythm, would have to consider other long term option. At this point, medication options would include tikosyn or amiodarone. He would have to stop taking benadryl and HCTZ if starts tikosyn. Due to age, it may be less desirable to be on amiodarone long term. Qtc 409 ms in NSR.  2. Secondary Hypercoagulable State (ICD10:  D68.69) The patient is at significant risk for stroke/thromboembolism based upon his CHA2DS2-VASc Score of 2.  Continue Apixaban (Eliquis).  No missed doses.   3. Obesity Body mass index is 32.2 kg/m. Lifestyle modification was discussed and encouraged including regular physical activity and weight reduction.  4. HTN Stable, no changes today.   Follow up  1-2 weeks after DCCV.   Lake Bells, PA-C Afib Clinic [facility] [address] [place], [place] [address] [phone] 12/09/2022 3:15 PM

## 2022-12-09 NOTE — Telephone Encounter (Signed)
Patient c/o Palpitations:  High priority if patient c/o lightheadedness, shortness of breath, or chest pain  How long have you had palpitations/irregular HR/ Afib? Are you having the symptoms now? Patients says he is in Afib at this time. He has been in Afib since Friday night  Are you currently experiencing lightheadedness, SOB or CP? lightheased  Do you have a history of afib (atrial fibrillation) or irregular heart rhythm?   Have you checked your BP or HR? (document readings if available): this morning it was 128/66 145/70 last night   Are you experiencing any other symptoms?  no

## 2022-12-10 ENCOUNTER — Ambulatory Visit: Payer: Medicare Other

## 2022-12-11 NOTE — Progress Notes (Signed)
Message left, Procedure scheduled for 1300, Please arrive at the hospital at 1200, NPO after midnight on Wednesday, May take meds with sips of water in the AM, please have transportation for home post procedure, and someone to stay with pt for approximately 24 hours after  Pt instructed to call back or notify dr's office if any doses of his Eliquis have been missed  May call [phone] with any questions

## 2022-12-12 ENCOUNTER — Ambulatory Visit (HOSPITAL_COMMUNITY): Payer: Medicare Other | Admitting: Anesthesiology

## 2022-12-12 ENCOUNTER — Other Ambulatory Visit: Payer: Self-pay

## 2022-12-12 ENCOUNTER — Encounter (HOSPITAL_COMMUNITY): Payer: Self-pay | Admitting: Cardiology

## 2022-12-12 ENCOUNTER — Encounter (HOSPITAL_COMMUNITY)
Admission: RE | Disposition: A | Discharge status: Home / Self Care | Payer: Self-pay | Source: Ambulatory Visit | Attending: Cardiology

## 2022-12-12 ENCOUNTER — Ambulatory Visit (HOSPITAL_COMMUNITY)
Admission: RE | Admit: 2022-12-12 | Discharge: 2022-12-12 | Disposition: A | Discharge status: Home / Self Care | Payer: Medicare Other | Source: Ambulatory Visit | Attending: Cardiology | Admitting: Cardiology

## 2022-12-12 ENCOUNTER — Ambulatory Visit (HOSPITAL_BASED_OUTPATIENT_CLINIC_OR_DEPARTMENT_OTHER): Payer: Medicare Other | Admitting: Anesthesiology

## 2022-12-12 DIAGNOSIS — I10 Essential (primary) hypertension: Secondary | ICD-10-CM

## 2022-12-12 DIAGNOSIS — J45909 Unspecified asthma, uncomplicated: Secondary | ICD-10-CM | POA: Diagnosis not present

## 2022-12-12 DIAGNOSIS — Z7901 Long term (current) use of anticoagulants: Secondary | ICD-10-CM | POA: Diagnosis not present

## 2022-12-12 DIAGNOSIS — I4891 Unspecified atrial fibrillation: Secondary | ICD-10-CM

## 2022-12-12 DIAGNOSIS — Z6832 Body mass index (BMI) 32.0-32.9, adult: Secondary | ICD-10-CM | POA: Insufficient documentation

## 2022-12-12 DIAGNOSIS — I4892 Unspecified atrial flutter: Secondary | ICD-10-CM

## 2022-12-12 DIAGNOSIS — I480 Paroxysmal atrial fibrillation: Secondary | ICD-10-CM | POA: Diagnosis not present

## 2022-12-12 DIAGNOSIS — D6869 Other thrombophilia: Secondary | ICD-10-CM | POA: Diagnosis not present

## 2022-12-12 DIAGNOSIS — E669 Obesity, unspecified: Secondary | ICD-10-CM | POA: Insufficient documentation

## 2022-12-12 DIAGNOSIS — Z87891 Personal history of nicotine dependence: Secondary | ICD-10-CM

## 2022-12-12 DIAGNOSIS — Z79899 Other long term (current) drug therapy: Secondary | ICD-10-CM | POA: Insufficient documentation

## 2022-12-12 DIAGNOSIS — I4819 Other persistent atrial fibrillation: Secondary | ICD-10-CM

## 2022-12-12 HISTORY — PX: CARDIOVERSION: SHX1299

## 2022-12-12 LAB — GLUCOSE, CAPILLARY: Glucose-Capillary: 129 mg/dL — ABNORMAL HIGH (ref 70–99)

## 2022-12-12 SURGERY — CARDIOVERSION
Anesthesia: General

## 2022-12-12 MED ORDER — PROPOFOL 10 MG/ML IV BOLUS
INTRAVENOUS | Status: DC | PRN
  Administered 2022-12-12: 70 mg via INTRAVENOUS

## 2022-12-12 MED ORDER — SODIUM CHLORIDE 0.9 % IV SOLN: INTRAVENOUS | Status: DC

## 2022-12-12 MED ORDER — LIDOCAINE HCL (CARDIAC) PF 100 MG/5ML IV SOSY
PREFILLED_SYRINGE | INTRAVENOUS | Status: DC | PRN
  Administered 2022-12-12: 20 mg via INTRAVENOUS

## 2022-12-12 SURGICAL SUPPLY — 1 items: ELECT DEFIB PAD ADLT CADENCE (PAD) ×2 IMPLANT

## 2022-12-12 NOTE — Anesthesia Postprocedure Evaluation (Signed)
Anesthesia Post Note  Patient: Aaron Ruiz  Procedure(s) Performed: CARDIOVERSION     Patient location during evaluation: PACU Anesthesia Type: General Level of consciousness: awake and alert Pain management: pain level controlled Vital Signs Assessment: post-procedure vital signs reviewed and stable Respiratory status: spontaneous breathing, nonlabored ventilation, respiratory function stable and patient connected to nasal cannula oxygen Cardiovascular status: blood pressure returned to baseline and stable Postop Assessment: no apparent nausea or vomiting Anesthetic complications: no  No notable events documented.  Last Vitals:  Vitals:   12/12/22 1000 12/12/22 1018  BP: 92/60 (!) 93/58  Pulse: 67 68  Resp: 10 12  Temp:    SpO2: 97% 98%    Last Pain:  Vitals:   12/12/22 0949  TempSrc: Temporal  PainSc: 0-No pain                 Kennieth Rad

## 2022-12-12 NOTE — Transfer of Care (Signed)
Immediate Anesthesia Transfer of Care Note  Patient: Aaron Ruiz  Procedure(s) Performed: CARDIOVERSION  Patient Location: Cath Lab  Anesthesia Type:MAC  Level of Consciousness: awake  Airway & Oxygen Therapy: Patient Spontanous Breathing  Post-op Assessment: Report given to RN and Post -op Vital signs reviewed and stable  Post vital signs: Reviewed and stable  Last Vitals:  Vitals Value Taken Time  BP 93/58 12/12/22 1018  Temp 36.7 C 12/12/22 0949  Pulse 66 12/12/22 1019  Resp 11 12/12/22 1019  SpO2 98 % 12/12/22 1019  Vitals shown include unvalidated device data.  Last Pain:  Vitals:   12/12/22 0949  TempSrc: Temporal  PainSc: 0-No pain         Complications: No notable events documented.

## 2022-12-12 NOTE — Interval H&P Note (Signed)
History and Physical Interval Note:  12/12/2022 9:08 AM  Aaron Ruiz  has presented today for surgery, with the diagnosis of AFIB.  The various methods of treatment have been discussed with the patient and family. After consideration of risks, benefits and other options for treatment, the patient has consented to  Procedure(s): CARDIOVERSION (N/A) as a surgical intervention.  The patient's history has been reviewed, patient examined, no change in status, stable for surgery.  I have reviewed the patient's chart and labs.  Questions were answered to the patient's satisfaction.     Armanda Magic

## 2022-12-12 NOTE — CV Procedure (Signed)
    Electrical Cardioversion Procedure Note Aaron Ruiz [id number] Jul 23, 1955  Procedure: Electrical Cardioversion Indications:  Atrial Flutter  Time Out: Verified patient identification, verified procedure,medications/allergies/relevent history reviewed, required imaging and test results available.  Performed  Procedure Details  The patient was NPO after midnight. Anesthesia was administered at the beside  by Dr.Fitzgerald with 70mg  of propofol  and 20mg  Lidocaine.  Cardioversion was done with synchronized biphasic defibrillation with AP pads with 150watts.  The patient converted to normal sinus rhythm. The patient tolerated the procedure well   IMPRESSION:  Successful cardioversion of atrial flutter    Aaron Ruiz 12/12/2022, 9:09 AM

## 2022-12-12 NOTE — Discharge Instructions (Signed)

## 2022-12-12 NOTE — Anesthesia Preprocedure Evaluation (Signed)
Anesthesia Evaluation  Patient identified by MRN, date of birth, ID band Patient awake    Reviewed: Allergy & Precautions, H&P , NPO status , Patient's Chart, lab work & pertinent test results  Airway Mallampati: II  TM Distance: >3 FB Neck ROM: Full    Dental no notable dental hx.    Pulmonary neg pulmonary ROS, asthma , former smoker   Pulmonary exam normal breath sounds clear to auscultation       Cardiovascular Exercise Tolerance: Good hypertension, Pt. on medications negative cardio ROS Normal cardiovascular exam Rhythm:Regular Rate:Normal     Neuro/Psych negative neurological ROS  negative psych ROS   GI/Hepatic Neg liver ROS,GERD  Medicated,,  Endo/Other  negative endocrine ROS    Renal/GU negative Renal ROS  negative genitourinary   Musculoskeletal negative musculoskeletal ROS (+) Arthritis , Osteoarthritis,    Abdominal   Peds negative pediatric ROS (+)  Hematology negative hematology ROS (+) Blood dyscrasia, anemia   Anesthesia Other Findings   Reproductive/Obstetrics negative OB ROS                             Anesthesia Physical Anesthesia Plan  ASA: 3  Anesthesia Plan: General   Post-op Pain Management: Minimal or no pain anticipated   Induction: Intravenous  PONV Risk Score and Plan: 2 and Treatment may vary due to age or medical condition and Propofol infusion  Airway Management Planned: Natural Airway and Mask  Additional Equipment: None  Intra-op Plan:   Post-operative Plan:   Informed Consent: I have reviewed the patients History and Physical, chart, labs and discussed the procedure including the risks, benefits and alternatives for the proposed anesthesia with the patient or authorized representative who has indicated his/her understanding and acceptance.       Plan Discussed with: Anesthesiologist and CRNA  Anesthesia Plan Comments: (  )         Anesthesia Quick Evaluation

## 2022-12-13 ENCOUNTER — Telehealth: Payer: Self-pay | Admitting: Physician Assistant

## 2022-12-13 ENCOUNTER — Encounter (HOSPITAL_COMMUNITY): Payer: Self-pay | Admitting: Cardiology

## 2022-12-13 NOTE — Telephone Encounter (Signed)
Patient paged after hour answering service due to recurrent atrial fibrillation.  He just underwent cardioversion yesterday by Dr. Mayford Knife.  He went into atrial fibrillation around 5:40 PM this afternoon.  Initial heart rate was in the 130s, however heart rate did settle down to 118 shortly after. Patient was recently seen by A-fib clinic on 12/09/2022, heart rate during the office visit was 102.  I asked the patient to hold his amlodipine and take extra dose of metoprolol succinate.  Unfortunately he has a history of bradycardia in the past.  He is on flecainide.  Blood pressure was 122/68.  With early return of A-fib, he likely will need to be considered for an different antiarrhythmic therapy.  I recommend he monitor his heart rate and the call A-fib clinic on Monday to arrange a earlier visit.  He is aware that he will need to call 911 and to come to the hospital if his heart rate stays at 140 or higher persistently or if he develop any severe dizziness or feeling of passing out.  Again, will need to consider an alternative antiarrhythmic therapy.

## 2022-12-16 ENCOUNTER — Ambulatory Visit (HOSPITAL_COMMUNITY)
Admission: RE | Admit: 2022-12-16 | Discharge: 2022-12-16 | Disposition: A | Discharge status: Home / Self Care | Payer: Medicare Other | Source: Ambulatory Visit | Attending: Internal Medicine | Admitting: Internal Medicine

## 2022-12-16 ENCOUNTER — Encounter: Payer: Self-pay | Admitting: Cardiology

## 2022-12-16 VITALS — BP 100/68 | HR 117 | Ht 67.0 in | Wt 206.4 lb

## 2022-12-16 DIAGNOSIS — Z79899 Other long term (current) drug therapy: Secondary | ICD-10-CM | POA: Diagnosis not present

## 2022-12-16 DIAGNOSIS — E669 Obesity, unspecified: Secondary | ICD-10-CM | POA: Insufficient documentation

## 2022-12-16 DIAGNOSIS — D6869 Other thrombophilia: Secondary | ICD-10-CM | POA: Diagnosis not present

## 2022-12-16 DIAGNOSIS — Z6832 Body mass index (BMI) 32.0-32.9, adult: Secondary | ICD-10-CM | POA: Insufficient documentation

## 2022-12-16 DIAGNOSIS — Z7901 Long term (current) use of anticoagulants: Secondary | ICD-10-CM | POA: Diagnosis not present

## 2022-12-16 DIAGNOSIS — I10 Essential (primary) hypertension: Secondary | ICD-10-CM | POA: Diagnosis not present

## 2022-12-16 DIAGNOSIS — I480 Paroxysmal atrial fibrillation: Secondary | ICD-10-CM | POA: Diagnosis not present

## 2022-12-16 DIAGNOSIS — I4892 Unspecified atrial flutter: Secondary | ICD-10-CM | POA: Diagnosis not present

## 2022-12-16 DIAGNOSIS — E785 Hyperlipidemia, unspecified: Secondary | ICD-10-CM | POA: Insufficient documentation

## 2022-12-16 NOTE — Progress Notes (Signed)
Primary Care Physician: Sandford Craze, NP Primary Cardiologist: Dr Servando Salina Primary Electrophysiologist: Dr Elberta Fortis Referring Physician: [facility]   Aaron Ruiz is a 67 y.o. male with a history of HTN, HLD, atrial fibrillation who presents for follow up in the [facility]. The patient was initially diagnosed with atrial fibrillation 10/03/20 after presenting to [facility] ED with symptoms of tachypalpitations. He had just seen Dr Servando Salina who placed a cardiac monitor to screen for arrhythmias. Patient was started on Eliquis for a CHADS2VASC score of 2. He was recently taken off of metoprolol due to bradycardia, this was resumed at the ED. He denies any significant alcohol use but does admit to snoring and some daytime somnolence. Patient was seen by Dr Elberta Fortis and started on flecainide. He eventually underwent afib ablation 02/08/22.   On follow up today, patient reports that he has done well since his ablation with no episodes of afib. He denies CP, swallowing pain, or groin issues. No bleeding issues on anticoagulation.   On follow up 12/09/22, patient is currently in atrial flutter. His ECG is abnormal and shows wide QRS complexes. He reports to be out of rhythm since this past Friday. He feels lightheaded and a little SOB with minimal exertion. He notes previously episodes were always brief and would convert to NSR quickly; over past couple of months episodes have seemed to last longer. Flecainide increased to 150 mg BID on 6/3 per phone notes. He has not missed any doses of Eliquis.  On follow up 12/16/22, he is currently in atrial flutter. S/p successful DCCV on 6/13 with ERAF. He called on 6/14 noting HR 120-130s. Amlodipine held and taking extra Toprol for total of 50 mg daily.   Today, he denies symptoms of palpitations, chest pain, shortness of breath, orthopnea, PND, lower extremity edema, dizziness, presyncope, syncope, bleeding, or neurologic  sequela. The patient is tolerating medications without difficulties and is otherwise without complaint today.    Atrial Fibrillation Risk Factors:  he does have symptoms or diagnosis of sleep apnea. Patient declined sleep study. he does not have a history of rheumatic fever. he does not have a history of alcohol use. The patient does have a history of early familial atrial fibrillation or other arrhythmias. Sister has afib s/p ablation.  he has a BMI of Body mass index is 32.33 kg/m.[name] Filed Weights   12/16/22 1504  Weight: 93.6 kg      Family History  Problem Relation Age of Onset   Coronary artery disease Father        CABG X5   Diabetes Father    Colon cancer Maternal Aunt    Cancer Brother        lung (smoker)   Hypertension Other    Kidney disease Other     Atrial Fibrillation Management history:  Previous antiarrhythmic drugs: flecainide  Previous cardioversions: none Previous ablations: 02/08/22 CHADS2VASC score: 2 Anticoagulation history: Eliquis   Past Medical History:  Diagnosis Date   Actinic keratosis 03/20/2009   Qualifier: Diagnosis of  By: Artist Pais DO, Barbette Hair    Allergic conjunctivitis 06/30/2018   Anemia    ANEMIA-NOS 02/06/2009   Qualifier: Diagnosis of  By: Terrilee Croak CMA, [name]     Asthma    uses inhaler   Basal cell carcinoma 2017   left side of nose   Basal cell carcinoma of nose 10/16/2020   Borderline diabetes 05/03/2011   BPH (benign prostatic hyperplasia) 07/29/2014  Chemosis of conjunctiva of both eyes 01/15/2017   Colon polyps    Daytime somnolence 09/21/2020   Diverticulosis    Essential hypertension 02/06/2009   Qualifier: Diagnosis of  By: Terrilee Croak CMA, [name]      Fatigue 09/21/2020   Former smoker 09/21/2020   General medical examination 11/14/2010   GERD 02/06/2009   Qualifier: Diagnosis of  By: Terrilee Croak CMA, [name]     GERD (gastroesophageal reflux disease)    on meds   High cholesterol 02/06/2009   Qualifier: Diagnosis of  By: Terrilee Croak  CMA, [name]     Hx of adenomatous colonic polyps 03/07/2009   Hx of eye surgery 2018   "Left Eye was swollen and had procedure to remove excess swelling"   Hyperglycemia 05/01/2011   Hyperlipidemia    Hypertension    Hypogonadism male 04/30/2011   Lactose intolerance    Lentigo 10/16/2020   Mild intermittent asthma 06/30/2018   Neoplasm of uncertain behavior of skin 10/16/2020   Obesity (BMI 30-39.9) 09/21/2020   OSA (obstructive sleep apnea) 11/22/2022   Osteoarthritis 09/24/2011   OVERWEIGHT 02/06/2009   Qualifier: Diagnosis of  By: Nena Jordan    Palpitations 09/21/2020   Paroxysmal atrial fibrillation (HCC) 10/06/2020   Rash/urticaria 08/07/2012   Recurrent UTI 07/30/2013   Routine general medical examination at a health care facility 12/16/2011   Secondary hypercoagulable state (HCC) 10/06/2020   SKIN LESION 03/20/2009   Qualifier: Diagnosis of  By: Nena Jordan    Snoring 09/21/2020   Unspecified asthma(493.90) 02/06/2009   Centricity Description: ASTHMA Qualifier: Diagnosis of  By: Terrilee Croak CMA, [name]   Centricity Description: ASTHMA, INTERMITTENT, MILD Qualifier: Diagnosis of  By: Peggyann Juba FNP, [name] S    URI (upper respiratory infection) 07/23/2011   Vitamin D deficiency 09/21/2020   Past Surgical History:  Procedure Laterality Date   ATRIAL FIBRILLATION ABLATION N/A 02/08/2022   Procedure: ATRIAL FIBRILLATION ABLATION;  Surgeon: Regan Lemming, MD;  Location: [facility] INVASIVE CV LAB;  Service: Cardiovascular;  Laterality: N/A;   CARDIOVERSION N/A 12/12/2022   Procedure: CARDIOVERSION;  Surgeon: Quintella Reichert, MD;  Location: [facility] INVASIVE CV LAB;  Service: Cardiovascular;  Laterality: N/A;   CYSTECTOMY  05/2004   removed fromback /sebaceous cyst   INGUINAL HERNIA REPAIR  1995   left   mohs procedure  2017   left side of nose   NASAL SEPTUM SURGERY      Current Outpatient Medications  Medication Sig Dispense Refill   acetaminophen (TYLENOL) 500 MG tablet Take 1 tablet  (500 mg total) by mouth every 6 (six) hours as needed. (Patient taking differently: Take 500 mg by mouth as needed.) 30 tablet 5   albuterol (VENTOLIN HFA) 108 (90 Base) MCG/ACT inhaler Inhale 2 puffs into the lungs every 6 (six) hours as needed for wheezing or shortness of breath. 8 g 0   apixaban (ELIQUIS) 5 MG TABS tablet TAKE 1 TABLET(5 MG) BY MOUTH TWICE DAILY 60 tablet 5   cetirizine (ZYRTEC) 10 MG tablet Take 10 mg by mouth daily.     Cholecalciferol (VITAMIN D) 50 MCG (2000 UT) tablet Take 2,000 Units by mouth daily.     diphenhydrAMINE (BENADRYL) 25 MG tablet Take 25 mg by mouth at bedtime.     flecainide (TAMBOCOR) 100 MG tablet Take 1 tablet (100 mg total) by mouth 2 (two) times daily.     fluticasone (FLONASE) 50 MCG/ACT nasal spray Place 2 sprays into both nostrils daily as needed for  allergies or rhinitis.     GLUCOSAMINE-CHONDROITIN PO Take 1 tablet by mouth in the morning and at bedtime.     guaiFENesin (MUCINEX) 600 MG 12 hr tablet Take 600 mg by mouth as needed for to loosen phlegm or cough.     losartan-hydrochlorothiazide (HYZAAR) 100-25 MG tablet Take 1 tablet by mouth daily. 90 tablet 1   metoprolol succinate (TOPROL-XL) 25 MG 24 hr tablet 1 daily (Patient taking differently: Take 50 mg by mouth daily.) 90 tablet 1   naphazoline-glycerin (CLEAR EYES REDNESS) 0.012-0.25 % SOLN Place 1-2 drops into both eyes 4 (four) times daily as needed for eye irritation.     NON FORMULARY Pt uses c-pap nightly     Omega-3 Fatty Acids (FISH OIL) 1200 MG CAPS Take 1,200 mg by mouth daily.     omeprazole (PRILOSEC) 40 MG capsule TAKE 1 CAPSULE(40 MG) BY MOUTH DAILY 90 capsule 1   pravastatin (PRAVACHOL) 40 MG tablet TAKE 1 TABLET(40 MG) BY MOUTH DAILY 90 tablet 1   sildenafil (REVATIO) 20 MG tablet 1-2 tabs by mouth prior to sexual activity 30 tablet 5   tamsulosin (FLOMAX) 0.4 MG CAPS capsule Take 1 capsule (0.4 mg total) by mouth daily. 90 capsule 1   Turmeric Curcumin 500 MG CAPS Take 500  mg by mouth daily.     amLODipine (NORVASC) 10 MG tablet Take 1 tablet (10 mg total) by mouth daily. (Patient not taking: Reported on 12/16/2022) 90 tablet 1   No current facility-administered medications for this encounter.    Allergies  Allergen Reactions   Sulfonamide Derivatives     Hallucination (Childhood Reaction)    Social History   Socioeconomic History   Marital status: Married    Spouse name: Not on file   Number of children: Not on file   Years of education: Not on file   Highest education level: Some college, no degree  Occupational History   Not on file  Tobacco Use   Smoking status: Former    Types: Cigarettes    Start date: 07/01/1969    Quit date: 07/01/1974    Years since quitting: 48.4   Smokeless tobacco: Never   Tobacco comments:    Former smoker 03/08/22  Vaping Use   Vaping Use: Never used  Substance and Sexual Activity   Alcohol use: No    Alcohol/week: 0.0 standard drinks of alcohol   Drug use: No   Sexual activity: Not on file  Other Topics Concern   Not on file  Social History Narrative   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[employer])   Married 33 years    1  daughter 34 - [name] (lives in [place])   1  son 28    Retired   Alcohol use-no   Smoking Status:  quit   Caffeine use/day:  2-3 beverages daily   Social Determinants of Health   Financial Resource Strain: Low Risk  (11/22/2022)   Overall Financial Resource Strain (CARDIA)    Difficulty of Paying Living Expenses: Not hard at all  Food Insecurity: No Food Insecurity (11/22/2022)   Hunger Vital Sign    Worried About Running Out of Food in the Last Year: Never true    Ran Out of Food in the Last Year: Never true  Transportation Needs: No Transportation Needs (11/22/2022)   PRAPARE - Administrator, Civil Service (Medical): No    Lack of Transportation (Non-Medical): No  Physical Activity: Sufficiently Active (11/22/2022)   Exercise  Vital Sign    Days of Exercise per Week: 5  days    Minutes of Exercise per Session: 60 min  Stress: No Stress Concern Present (11/22/2022)   [employer] of Occupational Health - Occupational Stress Questionnaire    Feeling of Stress : Not at all  Social Connections: Socially Integrated (11/22/2022)   Social Connection and Isolation Panel [NHANES]    Frequency of Communication with Friends and Family: Three times a week    Frequency of Social Gatherings with Friends and Family: Once a week    Attends Religious Services: More than 4 times per year    Active Member of [employer] or Organizations: Yes    Attends Engineer, structural: More than 4 times per year    Marital Status: Married  Catering manager Violence: Not At Risk (05/18/2021)   Humiliation, Afraid, Rape, and Kick questionnaire    Fear of Current or Ex-Partner: No    Emotionally Abused: No    Physically Abused: No    Sexually Abused: No     ROS- All systems are reviewed and negative except as per the HPI above.  Physical Exam: Vitals:   12/16/22 1504  BP: 100/68  Pulse: (!) 117  Weight: 93.6 kg  Height: 5\' 7"  (1.702 m)     GEN- The patient is well appearing, alert and oriented x 3 today.   Head- normocephalic, atraumatic Eyes-  Sclera clear, conjunctiva pink Ears- hearing intact Lungs- Clear to ausculation bilaterally, normal work of breathing Heart- Irregular tachycardic rate and rhythm, no murmurs, rubs or gallops, PMI not laterally displaced Extremities- no clubbing, cyanosis, or edema MS- no significant deformity or atrophy Skin- no rash or lesion Psych- euthymic mood, full affect Neuro- strength and sensation are intact   Wt Readings from Last 3 Encounters:  12/16/22 93.6 kg  12/09/22 93.3 kg  11/22/22 94.3 kg    EKG today demonstrates  Vent. rate 117 BPM PR interval 156 ms QRS duration 164 ms QT/QTcB 372/518 ms P-R-T axes 108 1 -20 Atrial flutter Right bundle branch block T wave abnormality, consider inferior  ischemia Abnormal ECG When compared with ECG of 09-Dec-2022 13:52, PREVIOUS ECG IS PRESENT  Echo 11/01/20 1. Left ventricular ejection fraction, by estimation, is 60 to 65%. The  left ventricle has normal function. The left ventricle has no regional  wall motion abnormalities. Left ventricular diastolic parameters were  normal.   2. Right ventricular systolic function is normal. The right ventricular  size is normal.   3. The mitral valve is normal in structure. Trivial mitral valve  regurgitation. No evidence of mitral stenosis.   4. The aortic valve is normal in structure. Aortic valve regurgitation is  not visualized. No aortic stenosis is present.   5. The inferior vena cava is normal in size with greater than 50%  respiratory variability, suggesting right atrial pressure of 3 mmHg.   Epic records are reviewed at length today  CHA2DS2-VASc Score = 2  The patient's score is based upon: CHF History: 0 HTN History: 1 Diabetes History: 0 Stroke History: 0 Vascular Disease History: 0 Age Score: 1 Gender Score: 0      ASSESSMENT AND PLAN: 1. Paroxysmal Atrial Fibrillation (ICD10:  I48.0) The patient's CHA2DS2-VASc score is 2, indicating a 2.2% annual risk of stroke.   S/p afib ablation 02/08/22 S/p DCCV on 12/12/22  Patient appears to be in atrial flutter with variable block and still has abnormal QRS.  Plan to discontinue flecainide. Decision whether  to begin Tikosyn for long term medication and will have to do flecainide washout. He could also do amiodarone as a bridge and discuss repeat ablation with Dr. Elberta Fortis. Take hyzaar half pill 50-12.5 mg daily. If sys BP > 100 take additional metoprolol 25 mg for total of Toprol 75 mg daily.  He will discuss further with wife and call insurance on pricing and call us back with decision.   He would have to stop taking benadryl and HCTZ if starts tikosyn. Due to age, it may be less desirable to be on amiodarone long term. Qtc 409 ms  in NSR.  2. Secondary Hypercoagulable State (ICD10:  D68.69) The patient is at significant risk for stroke/thromboembolism based upon his CHA2DS2-VASc Score of 2.  Continue Apixaban (Eliquis).  No missed doses.   3. Obesity Body mass index is 32.33 kg/m. Lifestyle modification was discussed and encouraged including regular physical activity and weight reduction.  4. HTN Stable but borderline hypotensive - cut to half dose hyzaar (50-12.5 mg) daily and take additional Toprol 25 mg for rate control.    Patient will call with decision.    Lake Bells, PA-C Af[facility] [address] [place], [place] [address] [phone] 12/16/2022 4:22 PM

## 2022-12-18 ENCOUNTER — Encounter: Payer: Self-pay | Admitting: Cardiology

## 2022-12-18 ENCOUNTER — Other Ambulatory Visit (HOSPITAL_COMMUNITY): Payer: Self-pay | Admitting: Internal Medicine

## 2022-12-18 ENCOUNTER — Telehealth (HOSPITAL_COMMUNITY): Payer: Self-pay | Admitting: *Deleted

## 2022-12-18 MED ORDER — AMIODARONE HCL 200 MG PO TABS
ORAL_TABLET | ORAL | 0 refills | Status: DC
Start: 2022-12-18 — End: 2022-12-18

## 2022-12-18 NOTE — Telephone Encounter (Signed)
Pt prefers amiodarone as bridge to ablation for rhythm control. Discussed with Landry Mellow PA will start amiodarone 200mg  bid for 14 days then reduction to once a day. Appt requested for follow up with Dr Elberta Fortis to discuss ablation. EKG appt in 2 weeks. Pt in agreement.

## 2022-12-23 ENCOUNTER — Ambulatory Visit (HOSPITAL_COMMUNITY): Payer: Medicare Other | Admitting: Internal Medicine

## 2022-12-24 DIAGNOSIS — G4733 Obstructive sleep apnea (adult) (pediatric): Secondary | ICD-10-CM | POA: Diagnosis not present

## 2022-12-25 ENCOUNTER — Telehealth: Payer: Self-pay

## 2022-12-25 DIAGNOSIS — I480 Paroxysmal atrial fibrillation: Secondary | ICD-10-CM

## 2022-12-25 NOTE — Telephone Encounter (Signed)
Pt is scheduled for an Afib Ablation on 11/4 at 7:30 AM.  He will come to [facility] for labs on 10/14..  Instruction letters will be mailed to pt when completed per his request.

## 2023-01-01 ENCOUNTER — Ambulatory Visit (HOSPITAL_COMMUNITY)
Admission: RE | Admit: 2023-01-01 | Discharge: 2023-01-01 | Disposition: A | Discharge status: Home / Self Care | Payer: Medicare Other | Source: Ambulatory Visit | Attending: Internal Medicine | Admitting: Internal Medicine

## 2023-01-01 VITALS — HR 49 | Ht 67.0 in

## 2023-01-01 DIAGNOSIS — I4891 Unspecified atrial fibrillation: Secondary | ICD-10-CM | POA: Insufficient documentation

## 2023-01-01 DIAGNOSIS — R001 Bradycardia, unspecified: Secondary | ICD-10-CM | POA: Insufficient documentation

## 2023-01-01 DIAGNOSIS — I4510 Unspecified right bundle-branch block: Secondary | ICD-10-CM | POA: Insufficient documentation

## 2023-01-01 LAB — TSH: TSH: 3.192 u[IU]/mL (ref 0.350–4.500)

## 2023-01-01 MED ORDER — AMIODARONE HCL 200 MG PO TABS: ORAL_TABLET | ORAL | Status: DC

## 2023-01-01 NOTE — Progress Notes (Signed)
Patient started amiodarone as a bridge to ablation and has upcoming appt with Dr. Elberta Fortis.  TSH drawn today.  ECG today shows  Vent. rate 49 BPM PR interval 158 ms QRS duration 134 ms QT/QTcB 460/415 ms P-R-T axes 52 14 20 Sinus bradycardia Right bundle branch block Abnormal ECG When compared with ECG of 16-Dec-2022 15:19, PREVIOUS ECG IS PRESENT   Patient will transition to amiodarone 200 mg daily. Pt will f/u as scheduled with Dr. Elberta Fortis.

## 2023-01-13 DIAGNOSIS — G4733 Obstructive sleep apnea (adult) (pediatric): Secondary | ICD-10-CM | POA: Diagnosis not present

## 2023-01-15 NOTE — Progress Notes (Signed)
07/23/22- 67 yoM former smoker for sleep evaluation with concern of daytime somnolence, courtesy of Christianne Dolin, NP Medical problem list includes HTN, PAFib/ablation/ Eliquis, Asthma, Allergic Rhinitis, GERD, Diverticulosis, BPH, Overweight, Epworth score-4 Body weight today- Covid vax-2Phizer Flu vax-had  -----Never had a sleep study. Has been snoring for years, occ trouble falling back to sleep, and some daytime sleepiness states he starts yawning more in the afternoon Retired. Dozes off if reading, but no problem with alertness if doing anything. ENT - remote broken nose, septoplasty. Still breathes more easily out of one side. Sleeps on sides. Benadryl for sleep. 1-2 cups coffee in AM. No blood relatives with sleep apnea.  01/17/23- 67 yoM former smoker followed for OSA, complicated by HTN, PAFib/ablation/ Eliquis, Asthma, Allergic Rhinitis, GERD, Diverticulosis, BPH, Overweight, HST(SNAP) 09/03/22- AHI (4%) 9.5/ hr, desaturation to 77%(14 minutes</= 88%), body weight 211 lbs Body weight today-210 lbs Electrical Cardioversion 12/12/22 CPAP 5/15/ [employer]  new 09/18/22 Download compliance-100%, AHI 0.5/ hr Download reviewed.  He is doing well with initial months of CPAP.  Does not notice a lot of change in sleep quality but wife indicates snoring is almost gone. Atrial fibrillation followed by cardiology and pending repeat ablation.  ROS-see HPI  + = positive Constitutional:    weight loss, night sweats, fevers, chills, fatigue, lassitude. HEENT:    headaches, difficulty swallowing, tooth/dental problems, sore throat,       sneezing, itching, ear ache, nasal congestion, post nasal drip, snoring CV:    chest pain, orthopnea, PND, swelling in lower extremities, anasarca,                                   dizziness, +palpitations Resp:   shortness of breath with exertion or at rest.                productive cough,   non-productive cough, coughing up of blood.              change in  color of mucus.  wheezing.   Skin:    rash or lesions. GI:  No-   heartburn, indigestion, abdominal pain, nausea, vomiting, diarrhea,                 change in bowel habits, loss of appetite GU: dysuria, change in color of urine, no urgency or frequency.   flank pain. MS:   joint pain, stiffness, decreased range of motion, back pain. Neuro-     nothing unusual Psych:  change in mood or affect.  depression or anxiety.   memory loss.  OBJ- Physical Exam General- Alert, Oriented, Affect-appropriate, Distress- none acute, +overweight Skin- rash-none, lesions- none, excoriation- none Lymphadenopathy- none Head- atraumatic            Eyes- Gross vision intact, PERRLA, conjunctivae and secretions clear            Ears- +Hearing aids            Nose- Clear, no-Septal dev, mucus, polyps, erosion, perforation             Throat- Mallampati III , mucosa clear , drainage- none, tonsils+, +teeth Neck- flexible , trachea midline, no stridor , thyroid nl, carotid no bruit Chest - symmetrical excursion , unlabored           Heart/CV- RRR , no murmur , no gallop  , no rub, nl s1 s2                           -  JVD- none , edema- none, stasis changes- none, varices- none           Lung- clear to P&A, wheeze- none, cough- none , dullness-none, rub- none           Chest wall-  Abd-  Br/ Gen/ Rectal- Not done, not indicated Extrem- cyanosis- none, clubbing, none, atrophy- none, strength- nl Neuro- grossly intact to observation

## 2023-01-17 ENCOUNTER — Ambulatory Visit (INDEPENDENT_AMBULATORY_CARE_PROVIDER_SITE_OTHER): Payer: Medicare Other | Admitting: Internal Medicine

## 2023-01-17 ENCOUNTER — Encounter: Payer: Self-pay | Admitting: Internal Medicine

## 2023-01-17 VITALS — BP 124/70 | HR 68 | Ht 67.0 in | Wt 210.0 lb

## 2023-01-17 DIAGNOSIS — G4733 Obstructive sleep apnea (adult) (pediatric): Secondary | ICD-10-CM

## 2023-01-17 DIAGNOSIS — I480 Paroxysmal atrial fibrillation: Secondary | ICD-10-CM | POA: Diagnosis not present

## 2023-01-17 NOTE — Assessment & Plan Note (Signed)
Rhythm nearly regular on exam at this visit.  He is working with cardiology and says he is pending repeat ablation.

## 2023-01-17 NOTE — Assessment & Plan Note (Signed)
Benefits from CPAP with good initial experience. Plan-continue auto 5-15

## 2023-01-17 NOTE — Patient Instructions (Signed)
We can continue CPAP auto 5-15  Ok to work with DME company on mask fit and comfort issues, as needed.

## 2023-02-04 DIAGNOSIS — G4733 Obstructive sleep apnea (adult) (pediatric): Secondary | ICD-10-CM | POA: Diagnosis not present

## 2023-02-07 DIAGNOSIS — M25552 Pain in left hip: Secondary | ICD-10-CM | POA: Diagnosis not present

## 2023-02-12 ENCOUNTER — Encounter: Payer: Self-pay | Admitting: Family

## 2023-02-12 DIAGNOSIS — M1990 Unspecified osteoarthritis, unspecified site: Secondary | ICD-10-CM

## 2023-02-17 ENCOUNTER — Encounter: Payer: Self-pay | Admitting: Orthopaedic Surgery

## 2023-02-26 ENCOUNTER — Ambulatory Visit (INDEPENDENT_AMBULATORY_CARE_PROVIDER_SITE_OTHER): Payer: Medicare Other | Admitting: Orthopaedic Surgery

## 2023-02-26 DIAGNOSIS — M1612 Unilateral primary osteoarthritis, left hip: Secondary | ICD-10-CM | POA: Diagnosis not present

## 2023-02-26 NOTE — Progress Notes (Signed)
Office Visit Note   Patient: Aaron Ruiz           Date of Birth: May 16, 1956           MRN: [id number] Visit Date: 02/26/2023              Requested by: Sandford Craze, NP [address] [place],  [place] [address] PCP: Sandford Craze, NP   Assessment & Plan: Visit Diagnoses:  1. Primary osteoarthritis of left hip     Plan: Given patient's failure of conservative treatment and his end-stage arthritis left hip recommend left total hip arthroplasty.  He will need to be off of his Eliquis for 3 days prior to surgery.  Risk benefits of surgery discussed with patient.  Postoperative protocol discussed with patient.  Handout on anterior hip replacement was given.  Risk include but are not limited to DVT/PE, nerve vessel injury, wound healing problems, infection, leg length discrepancy and blood loss.  Questions of the patient and his wife were answered by Dr. Magnus Ivan and myself.  She will follow-up 2 weeks postop.  Will work on scheduling him in the near future.  He will need cardiac clearance.  Follow-Up Instructions: No follow-ups on file.   Orders:  No orders of the defined types were placed in this encounter.  No orders of the defined types were placed in this encounter.     Procedures: No procedures performed   Clinical Data: No additional findings.   Subjective: Chief Complaint  Patient presents with   Left Hip - Pain    HPI Mr. Aaron Ruiz his 67 year old male was seen for the first time for left hip pain was been ongoing since February 2023.  He has been seen by the orthopedic office he was told that he needed a hip replacement.  He brings with him a CD with an AP pelvis and lateral view of the left hip.  Which shows end-stage arthritis with near bone-on-bone left hip and large osteophyte inferior femoral head.  No acute fractures.  Moderate right hip arthritis. Ranks his pain to be over the last week 5 out of 10 at worst.  He has declined any  sleeping.  He is unable to put on his shoes and socks on the left side due to pain and decreased range of motion.  He notes that he cannot walk for exercise due to the left hip pain.  He has had 2 intra-articular cortisone left hip.  States the first injection gave him good relief the second which was given in April of this year gave him no significant relief.  He states he is beginning to fall due to the hip pain.  He does not use any type of assistive device to ambulate.  He is on chronic anticoagulation and unable to take NSAIDs.  Does report that he is due to see his cardiologist in the near future.  He also has an ablation scheduled for atrial fibrillation November 4th.  Denies any chest pain shortness of breath fevers chills ongoing infections.  He is nondiabetic.  Reports good control of his hypertension.  Review of Systems See HPI otherwise negative or noncontributory  Objective: Vital Signs: There were no vitals taken for this visit.  Physical Exam Constitutional:      Appearance: He is not ill-appearing or diaphoretic.  Pulmonary:     Effort: Pulmonary effort is normal.  Neurological:     Mental Status: He is alert and oriented to person, place,  and time.  Psychiatric:        Behavior: Behavior normal.     Ortho Exam Bilateral hips: Full range of motion right hip without pain.  Left hip minimal external rotation and virtually no internal rotation.  Internal/external rotation attempts left hip causes some discomfort.  Calves are supple nontender bilaterally dorsiflexion plantarflexion bilateral ankles intact.  Specialty Comments:  No specialty comments available.  Imaging: No results found.   PMFS History: Patient Active Problem List   Diagnosis Date Noted   Atrial flutter (HCC) 12/12/2022   OSA (obstructive sleep apnea) 11/22/2022   Basal cell carcinoma of nose 10/16/2020   Lentigo 10/16/2020   Neoplasm of uncertain behavior of skin 10/16/2020   Hyperlipidemia     Paroxysmal atrial fibrillation (HCC) 10/06/2020   Hypercoagulable state due to paroxysmal atrial fibrillation (HCC) 10/06/2020   Palpitations 09/21/2020   Daytime somnolence 09/21/2020   Fatigue 09/21/2020   Snoring 09/21/2020   Former smoker 09/21/2020   Vitamin D deficiency 09/21/2020   Obesity (BMI 30-39.9) 09/21/2020   Lactose intolerance    Hypertension    GERD (gastroesophageal reflux disease)    Diverticulosis    Colon polyps    Anemia    Seasonal and perennial allergic rhinitis 06/30/2018   Allergic conjunctivitis 06/30/2018   Mild intermittent asthma 06/30/2018   Chemosis of conjunctiva of both eyes 01/15/2017   Hx of eye surgery 2018   Basal cell carcinoma 2017   BPH (benign prostatic hyperplasia) 07/29/2014   Recurrent UTI 07/30/2013   Rash/urticaria 08/07/2012   Encounter for monitoring flecainide therapy 12/16/2011   Osteoarthritis 09/24/2011   Borderline diabetes 05/03/2011   Hyperglycemia 05/01/2011   Hypogonadism male 04/30/2011   General medical examination 11/14/2010   ACTINIC KERATOSIS 03/20/2009   SKIN LESION 03/20/2009   Hx of adenomatous colonic polyps 03/07/2009   OVERWEIGHT 02/06/2009   ANEMIA-NOS 02/06/2009   Essential hypertension 02/06/2009   Asthma 02/06/2009   Past Medical History:  Diagnosis Date   Actinic keratosis 03/20/2009   Qualifier: Diagnosis of  By: Artist Pais DO, Barbette Hair    Allergic conjunctivitis 06/30/2018   Anemia    ANEMIA-NOS 02/06/2009   Qualifier: Diagnosis of  By: Terrilee Croak CMA, [name]     Asthma    uses inhaler   Basal cell carcinoma 2017   left side of nose   Basal cell carcinoma of nose 10/16/2020   Borderline diabetes 05/03/2011   BPH (benign prostatic hyperplasia) 07/29/2014   Chemosis of conjunctiva of both eyes 01/15/2017   Colon polyps    Daytime somnolence 09/21/2020   Diverticulosis    Essential hypertension 02/06/2009   Qualifier: Diagnosis of  By: Terrilee Croak CMA, [name]      Fatigue 09/21/2020   Former smoker 09/21/2020    General medical examination 11/14/2010   GERD 02/06/2009   Qualifier: Diagnosis of  By: Terrilee Croak CMA, [name]     GERD (gastroesophageal reflux disease)    on meds   High cholesterol 02/06/2009   Qualifier: Diagnosis of  By: Terrilee Croak CMA, [name]     Hx of adenomatous colonic polyps 03/07/2009   Hx of eye surgery 2018   "Left Eye was swollen and had procedure to remove excess swelling"   Hyperglycemia 05/01/2011   Hyperlipidemia    Hypertension    Hypogonadism male 04/30/2011   Lactose intolerance    Lentigo 10/16/2020   Mild intermittent asthma 06/30/2018   Neoplasm of uncertain behavior of skin 10/16/2020   Obesity (BMI 30-39.9) 09/21/2020   OSA (  obstructive sleep apnea) 11/22/2022   Osteoarthritis 09/24/2011   OVERWEIGHT 02/06/2009   Qualifier: Diagnosis of  By: Nena Jordan    Palpitations 09/21/2020   Paroxysmal atrial fibrillation (HCC) 10/06/2020   Rash/urticaria 08/07/2012   Recurrent UTI 07/30/2013   Routine general medical examination at a health care facility 12/16/2011   Secondary hypercoagulable state (HCC) 10/06/2020   SKIN LESION 03/20/2009   Qualifier: Diagnosis of  By: Nena Jordan    Snoring 09/21/2020   Unspecified asthma(493.90) 02/06/2009   Centricity Description: ASTHMA Qualifier: Diagnosis of  By: Terrilee Croak CMA, [name]   Centricity Description: ASTHMA, INTERMITTENT, MILD Qualifier: Diagnosis of  By: Peggyann Juba FNP, [name] S    URI (upper respiratory infection) 07/23/2011   Vitamin D deficiency 09/21/2020    Family History  Problem Relation Age of Onset   Coronary artery disease Father        CABG X5   Diabetes Father    Colon cancer Maternal Aunt    Cancer Brother        lung (smoker)   Hypertension Other    Kidney disease Other     Past Surgical History:  Procedure Laterality Date   ATRIAL FIBRILLATION ABLATION N/A 02/08/2022   Procedure: ATRIAL FIBRILLATION ABLATION;  Surgeon: Regan Lemming, MD;  Location: [facility];  Service: Cardiovascular;   Laterality: N/A;   CARDIOVERSION N/A 12/12/2022   Procedure: CARDIOVERSION;  Surgeon: Quintella Reichert, MD;  Location: [facility];  Service: Cardiovascular;  Laterality: N/A;   CYSTECTOMY  05/2004   removed fromback /sebaceous cyst   INGUINAL HERNIA REPAIR  1995   left   mohs procedure  2017   left side of nose   NASAL SEPTUM SURGERY     Social History   Occupational History   Not on file  Tobacco Use   Smoking status: Former    Current packs/day: 0.00    Types: Cigarettes    Start date: 07/01/1969    Quit date: 07/01/1974    Years since quitting: 48.6   Smokeless tobacco: Never   Tobacco comments:    Former smoker 03/08/22  Vaping Use   Vaping status: Never Used  Substance and Sexual Activity   Alcohol use: No    Alcohol/week: 0.0 standard drinks of alcohol   Drug use: No   Sexual activity: Not on file

## 2023-02-28 ENCOUNTER — Ambulatory Visit: Payer: Medicare Other | Attending: Cardiology | Admitting: Cardiology

## 2023-02-28 ENCOUNTER — Encounter: Payer: Self-pay | Admitting: Orthopaedic Surgery

## 2023-02-28 ENCOUNTER — Encounter: Payer: Self-pay | Admitting: Cardiology

## 2023-02-28 VITALS — BP 168/74 | HR 59 | Ht 67.0 in | Wt 211.4 lb

## 2023-02-28 DIAGNOSIS — D6869 Other thrombophilia: Secondary | ICD-10-CM

## 2023-02-28 DIAGNOSIS — I10 Essential (primary) hypertension: Secondary | ICD-10-CM

## 2023-02-28 DIAGNOSIS — I4819 Other persistent atrial fibrillation: Secondary | ICD-10-CM

## 2023-02-28 NOTE — Progress Notes (Addendum)
Electrophysiology Office Note:   Date:  02/28/2023  ID:  Aaron Ruiz, DOB Feb 21, 1956, MRN [id number]  Primary Cardiologist: Thomasene Ripple, DO Electrophysiologist: Regan Lemming, MD      History of Present Illness:   Aaron Ruiz is a 67 y.o. male with h/o atrial fibrillation, hyperlipidemia, hypertension seen today for routine electrophysiology followup.  Since last being seen in our clinic the patient reports doing well.  He was started on amiodarone.  Since then, he has had no further episodes of atrial fibrillation.  No acute complaints.  he denies chest pain, palpitations, dyspnea, PND, orthopnea, nausea, vomiting, dizziness, syncope, edema, weight gain, or early satiety.   Review of systems complete and found to be negative unless listed in HPI.   EP Information / Studies Reviewed:    EKG is ordered today. Personal review as below.  EKG Interpretation Date/Time:  Friday February 28 2023 08:47:55 EDT Ventricular Rate:  59 PR Interval:  164 QRS Duration:  134 QT Interval:  456 QTC Calculation: 451 R Axis:   58  Text Interpretation: Sinus bradycardia Right bundle branch block When compared with ECG of 01-Jan-2023 09:26, No significant change was found Confirmed by Soul Deveney ([id number]) on 02/28/2023 8:54:27 AM     Risk Assessment/Calculations:    CHA2DS2-VASc Score = 2   This indicates a 2.2% annual risk of stroke. The patient's score is based upon: CHF History: 0 HTN History: 1 Diabetes History: 0 Stroke History: 0 Vascular Disease History: 0 Age Score: 1 Gender Score: 0     Physical Exam:   VS:  BP (!) 168/74   Pulse (!) 59   Ht 5\' 7"  (1.702 m)   Wt 211 lb 6.4 oz (95.9 kg)   SpO2 98%   BMI 33.11 kg/m    Wt Readings from Last 3 Encounters:  02/28/23 211 lb 6.4 oz (95.9 kg)  01/17/23 210 lb (95.3 kg)  12/16/22 206 lb 6.4 oz (93.6 kg)     GEN: Well nourished, well developed in no acute distress NECK: No JVD; No carotid bruits CARDIAC: Regular  rate and rhythm, no murmurs, rubs, gallops RESPIRATORY:  Clear to auscultation without rales, wheezing or rhonchi  ABDOMEN: Soft, non-tender, non-distended EXTREMITIES:  No edema; No deformity   ASSESSMENT AND PLAN:    1.  Persistent atrial fibrillation: Currently on Eliquis and amiodarone.  Prior ablation 02/08/2022.  Plans for repeat ablation.  Risk and benefits have been discussed.  He understands the risks and is agreed to the procedure.  2.  Secondary hypercoagulable state: Currently on Eliquis for atrial fibrillation  3.  Hypertension: Elevated today.  Well-controlled at home  4.  Obesity: Has joined the [facility].  Lifestyle modification encouraged  5.  Preoperative evaluation: Patient has scheduled left total hip replacement surgery upcoming.  He has a low coronary calcium score and does not have any coronary symptoms.  He would be at intermediate risk for an intermediate risk procedure.  No further cardiac testing is necessary.  Follow up with Dr. Elberta Fortis as usual post procedure  Signed, Sharmila Wrobleski Jorja Loa, MD

## 2023-03-06 ENCOUNTER — Encounter: Payer: Self-pay | Admitting: Cardiology

## 2023-03-06 ENCOUNTER — Telehealth: Payer: Self-pay | Admitting: *Deleted

## 2023-03-06 DIAGNOSIS — R931 Abnormal findings on diagnostic imaging of heart and coronary circulation: Secondary | ICD-10-CM

## 2023-03-06 NOTE — Telephone Encounter (Signed)
   Pre-operative Risk Assessment    Patient Name: Aaron Ruiz  DOB: 11-18-1955 MRN: [id number]    LAST OFFICE VISIT 02/28/23 / NO FOLLOW-UP SCHEDULED  Request for Surgical Clearance    Procedure:   LEFT TOTAL HIP ARTHROPLASTY  Date of Surgery:  Clearance TBD                                 Surgeon:  Doneen Poisson, MD Surgeon's Group or Practice Name:  [facility] Phone number:  [phone] Fax number:  [phone]   Type of Clearance Requested:   - Medical  - Pharmacy:  Hold Apixaban (Eliquis) X'S 3 DAYS    Type of Anesthesia:  Spinal   Additional requests/questions:    [name]   03/06/2023, 3:22 PM

## 2023-03-06 NOTE — Telephone Encounter (Signed)
Pharmacy please advise on holding Eliquis prior to Left Total Hip Arthroplasty, scheduled for TBD. Thank you.

## 2023-03-07 DIAGNOSIS — R931 Abnormal findings on diagnostic imaging of heart and coronary circulation: Secondary | ICD-10-CM | POA: Insufficient documentation

## 2023-03-07 NOTE — Telephone Encounter (Signed)
Patient with diagnosis of afib on Eliquis for anticoagulation.    Procedure: left THA Date of procedure: TBD  CHA2DS2-VASc Score = 3  This indicates a 3.2% annual risk of stroke. The patient's score is based upon: CHF History: 0 HTN History: 1 Diabetes History: 0 Stroke History: 0 Vascular Disease History: 1 Age Score: 1 Gender Score: 0   Underwent cardioversion on 12/12/22.   CrCl 53mL/min Platelet count 183K  Per office protocol, patient can hold Eliquis for 3 days prior to procedure. However, he is pending afib ablation on 05/05/23 - cannot hold anticoag for 3 weeks prior to or 3 months post ablation. Procedure will need to be scheduled outside of this window.  **This guidance is not considered finalized until pre-operative APP has relayed final recommendations.**

## 2023-03-07 NOTE — Telephone Encounter (Signed)
   Name:  Aaron Ruiz  DOB:  07-14-1955  MRN:  [id number]   Primary Cardiologist: Thomasene Ripple, DO  Chart reviewed as part of pre-operative protocol coverage. Patient was contacted 03/07/2023 in reference to pre-operative risk assessment for pending surgery as outlined below.  Aaron Ruiz was last seen on 02/28/2023 by Dr.Camnitz.  Since that day, Aaron Ruiz has done well but is pending a Afib Ablation.   Per office protocol, patient can hold Eliquis for 3 days prior to procedure. However, he is pending afib ablation on 05/05/23 - cannot hold anticoag for 3 weeks prior to or 3 months post ablation. Procedure will need to be scheduled outside of this window.    Patient is not cleared to have Left Total Hip Arthroplasty until after Ablation. Please inform patient and reschedule. Feel free to reach out to Dr. Elberta Fortis office for questions.    Joni Reining, NP 03/07/2023, 8:29 AM

## 2023-03-10 ENCOUNTER — Other Ambulatory Visit (HOSPITAL_COMMUNITY): Payer: Self-pay | Admitting: Internal Medicine

## 2023-03-12 ENCOUNTER — Telehealth: Payer: Self-pay | Admitting: Cardiology

## 2023-03-12 NOTE — Telephone Encounter (Signed)
Pt trying to reschedule his ablation. Please advise

## 2023-03-12 NOTE — Telephone Encounter (Signed)
Spoke to pt. Pt would like to postpone repeat ablation until beginning of next year and proceed with knee surgery this year. Aware forwarding to MD for approval to post pone repeat ablation (currently scheduled for 11/4) and r/s to January. Pt informed that I do not think this will be a problem but will await MD response.  Dr. Elberta Fortis: if agreeable to post pone, please document and include he will be cleared to proceed w/ surgery prior to ablation.

## 2023-03-13 NOTE — Telephone Encounter (Signed)
Pt made aware ok to post pone repeat ablation to January. Will forward to April, procedure scheduler, to reschedule pt.  Pt aware Dr. Elberta Fortis will update clearance for procedure to prior to ablation. (Dr. Elberta Fortis did you update a note stating this?  If so which note, so that I may ensure pre op gets and we update clearance request)

## 2023-03-14 ENCOUNTER — Encounter: Payer: Self-pay | Admitting: Cardiology

## 2023-03-14 NOTE — Telephone Encounter (Signed)
Rescheduled ablation to 07/07/2022.  Cancelled current pre labs (10/14) & pre ablation CT (10/28).  Aware we will get these rescheduled. Informed that we would reach out at a later date to go over instructions and schedule an OV w/ Dr. Elberta Fortis for H&P prior to procedure. Pt agreeable to plan.  Will forward this note to staff at Ortho for their FYI.

## 2023-03-14 NOTE — Telephone Encounter (Signed)
The documentation note (prior to the one I sent you) did ask for Dr. Elberta Fortis to update his note because I could not see that he addended note.   I will resend to him again, but did get verbal from him. I will ask him Monday to ensure this gets updated. Thanks

## 2023-03-18 NOTE — Telephone Encounter (Signed)
Spoke with patient and scheduled surgery.

## 2023-03-18 NOTE — Telephone Encounter (Signed)
Called pt who reports that he just hung up with their office and he is scheduled for next month. He appreciates my follow up.

## 2023-04-02 ENCOUNTER — Other Ambulatory Visit: Payer: Self-pay

## 2023-04-04 NOTE — Pre-Procedure Instructions (Signed)
Surgical Instructions   Your procedure is scheduled on April 15, 2023. Report to [facility] Main Entrance "A" at 5:30 A.M., then check in with the Admitting office. Any questions or running late day of surgery: call [phone]  Questions prior to your surgery date: call [phone], Monday-Friday, 8am-4pm. If you experience any cold or flu symptoms such as cough, fever, chills, shortness of breath, etc. between now and your scheduled surgery, please notify us at the above number.     Remember:  Do not eat after midnight the night before your surgery  You may drink clear liquids until 4:30 AM the morning of your surgery.   Clear liquids allowed are: Water, Non-Citrus Juices (without pulp), Carbonated Beverages, Clear Tea, Black Coffee Only (NO MILK, CREAM OR POWDERED CREAMER of any kind), and Gatorade.  Patient Instructions  The night before surgery:  No food after midnight. ONLY clear liquids after midnight  The day of surgery (if you do NOT have diabetes):  Drink ONE (1) Pre-Surgery Clear Ensure by 4:30 AM the morning of surgery. Drink in one sitting. Do not sip.  This drink was given to you during your hospital  pre-op appointment visit.  Nothing else to drink after completing the  Pre-Surgery Clear Ensure.         If you have questions, please contact your surgeon's office.     Take these medicines the morning of surgery with A SIP OF WATER: amiodarone (PACERONE)  amLODipine (NORVASC)  cetirizine (ZYRTEC)  metoprolol succinate (TOPROL-XL)  omeprazole (PRILOSEC)  pravastatin (PRAVACHOL)  tamsulosin [facility])    May take these medicines IF NEEDED: acetaminophen (TYLENOL)  albuterol (VENTOLIN HFA) inhaler  fluticasone (FLONASE) nasal spray  naphazoline-glycerin (CLEAR EYES REDNESS) eye drops   STOP taking your apixaban (ELIQUIS) three days prior to surgery. Your last dose will be October 11th.   One week prior to surgery, STOP taking any Aspirin (unless  otherwise instructed by your surgeon) Aleve, Naproxen, Ibuprofen, Motrin, Advil, Goody's, BC's, all herbal medications, fish oil, and non-prescription vitamins.                     Do NOT Smoke (Tobacco/Vaping) for 24 hours prior to your procedure.  If you use a CPAP at night, you may bring your mask/headgear for your overnight stay.   You will be asked to remove any contacts, glasses, piercing's, hearing aid's, dentures/partials prior to surgery. Please bring cases for these items if needed.    Patients discharged the day of surgery will not be allowed to drive home, and someone needs to stay with them for 24 hours.  SURGICAL WAITING ROOM VISITATION Patients may have no more than 2 support people in the waiting area - these visitors may rotate.   Pre-op nurse will coordinate an appropriate time for 1 ADULT support person, who may not rotate, to accompany patient in pre-op.  Children under the age of 22 must have an adult with them who is not the patient and must remain in the main waiting area with an adult.  If the patient needs to stay at the hospital during part of their recovery, the visitor guidelines for inpatient rooms apply.  Please refer to the [facility] website for the visitor guidelines for any additional information.   If you received a COVID test during your pre-op visit  it is requested that you wear a mask when out in public, stay away from anyone that may not be feeling well and notify your  surgeon if you develop symptoms. If you have been in contact with anyone that has tested positive in the last 10 days please notify you surgeon.      Pre-operative 5 CHG Bathing Instructions   You can play a key role in reducing the risk of infection after surgery. Your skin needs to be as free of germs as possible. You can reduce the number of germs on your skin by washing with CHG (chlorhexidine gluconate) soap before surgery. CHG is an antiseptic soap that kills germs and  continues to kill germs even after washing.   DO NOT use if you have an allergy to chlorhexidine/CHG or antibacterial soaps. If your skin becomes reddened or irritated, stop using the CHG and notify one of our RNs at [phone].   Please shower with the CHG soap starting 4 days before surgery using the following schedule:     Please keep in mind the following:  DO NOT shave, including legs and underarms, starting the day of your first shower.   You may shave your face at any point before/day of surgery.  Place clean sheets on your bed the day you start using CHG soap. Use a clean washcloth (not used since being washed) for each shower. DO NOT sleep with pets once you start using the CHG.   CHG Shower Instructions:  Wash your face and private area with normal soap. If you choose to wash your hair, wash first with your normal shampoo.  After you use shampoo/soap, rinse your hair and body thoroughly to remove shampoo/soap residue.  Turn the water OFF and apply about 3 tablespoons (45 ml) of CHG soap to a CLEAN washcloth.  Apply CHG soap ONLY FROM YOUR NECK DOWN TO YOUR TOES (washing for 3-5 minutes)  DO NOT use CHG soap on face, private areas, open wounds, or sores.  Pay special attention to the area where your surgery is being performed.  If you are having back surgery, having someone wash your back for you may be helpful. Wait 2 minutes after CHG soap is applied, then you may rinse off the CHG soap.  Pat dry with a clean towel  Put on clean clothes/pajamas   If you choose to wear lotion, please use ONLY the CHG-compatible lotions on the back of this paper.   Additional instructions for the day of surgery: DO NOT APPLY any lotions, deodorants, cologne, or perfumes.   Do not bring valuables to the hospital. [facility] is not responsible for any belongings/valuables. Do not wear nail polish, gel polish, artificial nails, or any other type of covering on natural nails (fingers and  toes) Do not wear jewelry or makeup Put on clean/comfortable clothes.  Please brush your teeth.  Ask your nurse before applying any prescription medications to the skin.     CHG Compatible Lotions   Aveeno Moisturizing lotion  Cetaphil Moisturizing Cream  Cetaphil Moisturizing Lotion  Clairol Herbal Essence Moisturizing Lotion, Dry Skin  Clairol Herbal Essence Moisturizing Lotion, Extra Dry Skin  Clairol Herbal Essence Moisturizing Lotion, Normal Skin  Curel Age Defying Therapeutic Moisturizing Lotion with Alpha Hydroxy  Curel Extreme Care Body Lotion  Curel Soothing Hands Moisturizing Hand Lotion  Curel Therapeutic Moisturizing Cream, Fragrance-Free  Curel Therapeutic Moisturizing Lotion, Fragrance-Free  Curel Therapeutic Moisturizing Lotion, Original Formula  Eucerin Daily Replenishing Lotion  Eucerin Dry Skin Therapy Plus Alpha Hydroxy Crme  Eucerin Dry Skin Therapy Plus Alpha Hydroxy Lotion  Eucerin Original Crme  Eucerin Original Lotion  Eucerin Plus  Crme Eucerin Plus Lotion  Eucerin TriLipid Replenishing Lotion  Keri Anti-Bacterial Hand Lotion  Keri Deep Conditioning Original Lotion Dry Skin Formula Softly Scented  Keri Deep Conditioning Original Lotion, Fragrance Free Sensitive Skin Formula  Keri Lotion Fast Absorbing Fragrance Free Sensitive Skin Formula  Keri Lotion Fast Absorbing Softly Scented Dry Skin Formula  Keri Original Lotion  Keri Skin Renewal Lotion Keri Silky Smooth Lotion  Keri Silky Smooth Sensitive Skin Lotion  Nivea Body Creamy Conditioning Oil  Nivea Body Extra Enriched Lotion  Nivea Body Original Lotion  Nivea Body Sheer Moisturizing Lotion Nivea Crme  Nivea Skin Firming Lotion  NutraDerm 30 Skin Lotion  NutraDerm Skin Lotion  NutraDerm Therapeutic Skin Cream  NutraDerm Therapeutic Skin Lotion  ProShield Protective Hand Cream  Provon moisturizing lotion  Please read over the following fact sheets that you were given.

## 2023-04-07 ENCOUNTER — Other Ambulatory Visit: Payer: Self-pay

## 2023-04-07 ENCOUNTER — Encounter (HOSPITAL_COMMUNITY): Payer: Self-pay

## 2023-04-07 ENCOUNTER — Ambulatory Visit (HOSPITAL_COMMUNITY)
Admission: RE | Admit: 2023-04-07 | Discharge: 2023-04-07 | Disposition: A | Discharge status: Home / Self Care | Payer: Medicare Other | Source: Ambulatory Visit | Attending: Vascular Surgery | Admitting: Vascular Surgery

## 2023-04-07 VITALS — BP 142/65 | HR 59 | Temp 98.6°F | Resp 19 | Ht 67.0 in | Wt 211.3 lb

## 2023-04-07 DIAGNOSIS — Z85828 Personal history of other malignant neoplasm of skin: Secondary | ICD-10-CM | POA: Diagnosis not present

## 2023-04-07 DIAGNOSIS — E785 Hyperlipidemia, unspecified: Secondary | ICD-10-CM | POA: Insufficient documentation

## 2023-04-07 DIAGNOSIS — I10 Essential (primary) hypertension: Secondary | ICD-10-CM | POA: Insufficient documentation

## 2023-04-07 DIAGNOSIS — I480 Paroxysmal atrial fibrillation: Secondary | ICD-10-CM | POA: Insufficient documentation

## 2023-04-07 DIAGNOSIS — M1612 Unilateral primary osteoarthritis, left hip: Secondary | ICD-10-CM | POA: Insufficient documentation

## 2023-04-07 DIAGNOSIS — Z79899 Other long term (current) drug therapy: Secondary | ICD-10-CM | POA: Diagnosis not present

## 2023-04-07 DIAGNOSIS — I4892 Unspecified atrial flutter: Secondary | ICD-10-CM | POA: Diagnosis not present

## 2023-04-07 DIAGNOSIS — Z7901 Long term (current) use of anticoagulants: Secondary | ICD-10-CM | POA: Diagnosis not present

## 2023-04-07 DIAGNOSIS — E119 Type 2 diabetes mellitus without complications: Secondary | ICD-10-CM | POA: Insufficient documentation

## 2023-04-07 DIAGNOSIS — G4733 Obstructive sleep apnea (adult) (pediatric): Secondary | ICD-10-CM | POA: Diagnosis not present

## 2023-04-07 DIAGNOSIS — Z01812 Encounter for preprocedural laboratory examination: Secondary | ICD-10-CM | POA: Insufficient documentation

## 2023-04-07 DIAGNOSIS — K219 Gastro-esophageal reflux disease without esophagitis: Secondary | ICD-10-CM | POA: Diagnosis not present

## 2023-04-07 DIAGNOSIS — N400 Enlarged prostate without lower urinary tract symptoms: Secondary | ICD-10-CM | POA: Insufficient documentation

## 2023-04-07 DIAGNOSIS — Z01818 Encounter for other preprocedural examination: Secondary | ICD-10-CM

## 2023-04-07 HISTORY — DX: Cardiac arrhythmia, unspecified: I49.9

## 2023-04-07 LAB — CBC
HCT: 41.6 % (ref 39.0–52.0)
Hemoglobin: 13.7 g/dL (ref 13.0–17.0)
MCH: 30.6 pg (ref 26.0–34.0)
MCHC: 32.9 g/dL (ref 30.0–36.0)
MCV: 92.9 fL (ref 80.0–100.0)
Platelets: 163 10*3/uL (ref 150–400)
RBC: 4.48 MIL/uL (ref 4.22–5.81)
RDW: 13.2 % (ref 11.5–15.5)
WBC: 6.4 10*3/uL (ref 4.0–10.5)
nRBC: 0 % (ref 0.0–0.2)

## 2023-04-07 LAB — COMPREHENSIVE METABOLIC PANEL
ALT: 55 U/L — ABNORMAL HIGH (ref 0–44)
AST: 33 U/L (ref 15–41)
Albumin: 4 g/dL (ref 3.5–5.0)
Alkaline Phosphatase: 50 U/L (ref 38–126)
Anion gap: 11 (ref 5–15)
BUN: 18 mg/dL (ref 8–23)
CO2: 23 mmol/L (ref 22–32)
Calcium: 8.8 mg/dL — ABNORMAL LOW (ref 8.9–10.3)
Chloride: 102 mmol/L (ref 98–111)
Creatinine, Ser: 1.07 mg/dL (ref 0.61–1.24)
GFR, Estimated: >60 mL/min (ref >60)
Glucose, Bld: 99 mg/dL (ref 70–99)
Potassium: 3.7 mmol/L (ref 3.5–5.1)
Sodium: 136 mmol/L (ref 135–145)
Total Bilirubin: 0.6 mg/dL (ref 0.3–1.2)
Total Protein: 7.1 g/dL (ref 6.5–8.1)

## 2023-04-07 LAB — TYPE AND SCREEN
ABO/RH(D): O POS
Antibody Screen: NEGATIVE

## 2023-04-07 LAB — SURGICAL PCR SCREEN
MRSA, PCR: NEGATIVE
Staphylococcus aureus: NEGATIVE

## 2023-04-07 NOTE — Progress Notes (Addendum)
PCP - Sandford Craze, NP Cardiologist - Casimer Bilis; Will Tomi Likens  PPM/ICD - Denies Device Orders -  Rep Notified -   Chest x-ray - na EKG - 02/28/23 Stress Test - denies ECHO - 11/21/20 Cardiac Cath - denies  Sleep Study - 09/03/22 CPAP - yes  Fasting Blood Sugar - na Checks Blood Sugar _____ times a day  Last dose of GLP1 agonist-  na GLP1 instructions:   Blood Thinner Instructions:stop Eliquis 3 days prior to surgery. Last dose will be 04/11/23. Aspirin Instructions:na  ERAS Protcol -clear liquids until 0430. PRE-SURGERY Ensure or G2- Ensure  COVID TEST- no   Anesthesia review: yes - history of afib;cardia clearance  Patient denies shortness of breath, fever, cough and chest pain at PAT appointment   All instructions explained to the patient, with a verbal understanding of the material. Patient agrees to go over the instructions while at home for a better understanding. Patient also instructed to wear a mask when out in public prior to surgery. The opportunity to ask questions was provided.

## 2023-04-08 NOTE — Progress Notes (Signed)
Anesthesia Chart Review:  Case: [id number] Date/Time: 04/15/23 0715   Procedure: LEFT TOTAL HIP ARTHROPLASTY ANTERIOR APPROACH (Left: Hip)   Anesthesia type: Spinal   Pre-op diagnosis: left hip osteoarthritis   [facility] / [facility]   Surgeons: Kathryne Hitch, MD       DISCUSSION: Patient is a 67 year old male scheduled for the above procedure.  History includes number smoker (quit 07/01/74), HTN, HLD, borderline DM2, PAF (diagnosed 10/03/20, s/p ablation 02/08/22, DCCV 12/12/22), asthma, OSA (CPAP, auto 5-15), GERD, anemia, BPH, osteoarthritis, skin cancer (BCC, s/p MOHS left nose).   He last saw EP Dr. Elberta Fortis on 02/28/23. He has known PAF. S/p ablation 02/08/22 and DCCV of aflutter 12/12/22 with recurrence 12/13/22 on flecainide. Flecainide discontinued and started on amiodarone bridge 12/16/22 until repeat ablation. Repeat EKG 01/01/23 showed SB at 49 bpm. No further afib as of 02/28/23 EP follow-up. Patient preferred to schedule repeat ablation after his left THA. Dr. Elberta Fortis wrote, "Preoperative evaluation: Patient has scheduled left total hip replacement surgery upcoming.  He has a low coronary calcium score and does not have any coronary symptoms.  He would be at intermediate risk for an intermediate risk procedure.  No further cardiac testing is necessary." Per their office protocol can hold Eliquis for 3 days prior to procedure (but cannot hold anticoagulation for 3 weeks prior to or 3 months post ablation, which is now scheduled for 07/08/23).    Last Eliquis dose is scheduled for 04/11/23.   Anesthesia team to evaluate on the day of surgery.   VS: BP (!) 142/65   Pulse (!) 59   Temp 37 C   Resp 19   Ht 5\' 7"  (1.702 m)   Wt 95.8 kg   SpO2 98%   BMI 33.09 kg/m    PROVIDERS: Sandford Craze, NP is PCP Thomasene Ripple, DO is primary cardiologist Loman Brooklyn, MD is EP cardiologist Jetty Duhamel, MD is pulmonologist   LABS: Preoperative labs noted. ALT mildly  elevated at 55. Normal AST and bilirubin. CBC normal.  A1c 6.1% on 11/22/22.  (all labs ordered are listed, but only abnormal results are displayed)  Labs Reviewed  COMPREHENSIVE METABOLIC PANEL - Abnormal; Notable for the following components:      Result Value   Calcium 8.8 (*)    ALT 55 (*)    All other components within normal limits  SURGICAL PCR SCREEN  CBC  TYPE AND SCREEN    Home Sleep Study 09/15/22: As outlined by Dr. Maple Hudson, "09/03/22- AHI (4%) 9.5/ hr, desaturation to 77%(14 minutes</= 88%), body weight 211 lbs"   IMAGES: CT Chest (CT Cardiac over read) 02/06/22: IMPRESSION: 5 mm nodule seen medially in lingular segment of left upper lobe. Although likely benign, if the patient is high-risk, given the morphology and/or location of this nodule a non-contrast chest CT can be considered in 12 months.This recommendation follows the consensus statement: Guidelines for Management of Incidental Pulmonary Nodules Detected on CT Images: From the Fleischner Society 2017; Radiology 2017; 284:228-243. - Dr. Elberta Fortis reviewed, "Needs CT scan in 12 months for pulmonary nodule with history of tobacco"   EKG: 02/28/23 Sinus bradycardia at 59 bpm Right bundle branch block When compared with ECG of 01-Jan-2023 09:26, No significant change was found Confirmed by Camnitz, Will ([id number]) on 02/28/2023 8:54:27 AM   CV: CT Cardiac (pre-ablation) 02/06/22: IMPRESSION: 1. There is variant pulmonary vein drainage into the left atrium (3 on the right and 2 on the left). 2. The  left atrial appendage is large chicken wing / broccoli type with a single lobe and ostial size 20 x 12 mm and length 46 mm. There is no thrombus in the left atrial appendage. 3. The esophagus runs in the left atrial midline and is not in the proximity to any of the pulmonary veins. 4. Calcium score: Coronary calcium score of 377. This was 73rd percentile for age-, race-, and sex-matched controls.    Echo  11/01/20: IMPRESSIONS   1. Left ventricular ejection fraction, by estimation, is 60 to 65%. The  left ventricle has normal function. The left ventricle has no regional  wall motion abnormalities. Left ventricular diastolic parameters were  normal.   2. Right ventricular systolic function is normal. The right ventricular  size is normal.   3. The mitral valve is normal in structure. Trivial mitral valve  regurgitation. No evidence of mitral stenosis.   4. The aortic valve is normal in structure. Aortic valve regurgitation is  not visualized. No aortic stenosis is present.   5. The inferior vena cava is normal in size with greater than 50%  respiratory variability, suggesting right atrial pressure of 3 mmHg.    [place] Abd Aorta 11/01/20: Summary:  Abdominal Aorta: No evidence of an abdominal aortic aneurysm was  visualized. The largest aortic measurement is 2.0 cm.  Stenosis:  No evidence of stenosis seen.    13 day Monitor 09/21/20: Conclusion: This study is remarkable for symptomatic atrial fibrillation/atrial flutter and paroxysmal atrial tachycardia.   Past Medical History:  Diagnosis Date   Actinic keratosis 03/20/2009   Qualifier: Diagnosis of  By: Artist Pais DO, D. Robert    Allergic conjunctivitis 06/30/2018   Anemia    ANEMIA-NOS 02/06/2009   Qualifier: Diagnosis of  By: Terrilee Croak CMA, [name]     Asthma    uses inhaler   Basal cell carcinoma 2017   left side of nose   Basal cell carcinoma of nose 10/16/2020   Borderline diabetes 05/03/2011   BPH (benign prostatic hyperplasia) 07/29/2014   Chemosis of conjunctiva of both eyes 01/15/2017   Colon polyps    Daytime somnolence 09/21/2020   Diverticulosis    Dysrhythmia    Essential hypertension 02/06/2009   Qualifier: Diagnosis of  By: Terrilee Croak CMA, [name]      Fatigue 09/21/2020   Former smoker 09/21/2020   General medical examination 11/14/2010   GERD 02/06/2009   Qualifier: Diagnosis of  By: Terrilee Croak CMA, [name]     GERD  (gastroesophageal reflux disease)    on meds   High cholesterol 02/06/2009   Qualifier: Diagnosis of  By: Terrilee Croak CMA, [name]     Hx of adenomatous colonic polyps 03/07/2009   Hx of eye surgery 2018   "Left Eye was swollen and had procedure to remove excess swelling"   Hyperglycemia 05/01/2011   Hyperlipidemia    Hypertension    Hypogonadism male 04/30/2011   Lactose intolerance    Lentigo 10/16/2020   Mild intermittent asthma 06/30/2018   Neoplasm of uncertain behavior of skin 10/16/2020   Obesity (BMI 30-39.9) 09/21/2020   OSA (obstructive sleep apnea) 11/22/2022   Osteoarthritis 09/24/2011   OVERWEIGHT 02/06/2009   Qualifier: Diagnosis of  By: Nena Jordan    Palpitations 09/21/2020   Paroxysmal atrial fibrillation (HCC) 10/06/2020   Rash/urticaria 08/07/2012   Recurrent UTI 07/30/2013   Routine general medical examination at a health care facility 12/16/2011   Secondary hypercoagulable state (HCC) 10/06/2020   SKIN LESION 03/20/2009  Qualifier: Diagnosis of  By: Nena Jordan    Snoring 09/21/2020   Unspecified asthma(493.90) 02/06/2009   Centricity Description: ASTHMA Qualifier: Diagnosis of  By: Terrilee Croak CMA, [name]   Centricity Description: ASTHMA, INTERMITTENT, MILD Qualifier: Diagnosis of  By: Peggyann Juba FNP, [name] S    URI (upper respiratory infection) 07/23/2011   Vitamin D deficiency 09/21/2020    Past Surgical History:  Procedure Laterality Date   ATRIAL FIBRILLATION ABLATION N/A 02/08/2022   Procedure: ATRIAL FIBRILLATION ABLATION;  Surgeon: Regan Lemming, MD;  [facility];  Service: Cardiovascular;  Laterality: N/A;   CARDIOVERSION N/A 12/12/2022   Procedure: CARDIOVERSION;  Surgeon: Quintella Reichert, MD;  Location: [facility];  Service: Cardiovascular;  Laterality: N/A;   CYSTECTOMY  05/2004   removed fromback /sebaceous cyst   INGUINAL HERNIA REPAIR  1995   left   mohs procedure  2017   left side of nose   NASAL  SEPTUM SURGERY      MEDICATIONS:  acetaminophen (TYLENOL) 500 MG tablet   albuterol (VENTOLIN HFA) 108 (90 Base) MCG/ACT inhaler   amiodarone (PACERONE) 200 MG tablet   amLODipine (NORVASC) 10 MG tablet   apixaban (ELIQUIS) 5 MG TABS tablet   cetirizine (ZYRTEC) 10 MG tablet   Cholecalciferol (VITAMIN D) 50 MCG (2000 UT) tablet   diphenhydrAMINE (BENADRYL) 25 MG tablet   fluticasone (FLONASE) 50 MCG/ACT nasal spray   GLUCOSAMINE-CHONDROITIN PO   guaiFENesin (MUCINEX) 600 MG 12 hr tablet   losartan-hydrochlorothiazide (HYZAAR) 100-25 MG tablet   metoprolol succinate (TOPROL-XL) 25 MG 24 hr tablet   naphazoline-glycerin (CLEAR EYES REDNESS) 0.012-0.25 % SOLN   NON FORMULARY   Omega-3 Fatty Acids (FISH OIL) 1200 MG CAPS   omeprazole (PRILOSEC) 40 MG capsule   pravastatin (PRAVACHOL) 40 MG tablet   sildenafil (REVATIO) 20 MG tablet   tamsulosin (FLOMAX) 0.4 MG CAPS capsule   Turmeric Curcumin 500 MG CAPS   No current facility-administered medications for this encounter.    Shonna Chock, PA-C Surgical Short Stay/Anesthesiology [facility] Phone [phone] [facility] Phone [phone] 04/08/2023 12:20 PM

## 2023-04-08 NOTE — Anesthesia Preprocedure Evaluation (Addendum)
Anesthesia Evaluation  Patient identified by MRN, date of birth, ID band Patient awake    Reviewed: Allergy & Precautions, H&P , NPO status , Patient's Chart, lab work & pertinent test results  Airway Mallampati: II   Neck ROM: full    Dental   Pulmonary asthma , sleep apnea , former smoker   breath sounds clear to auscultation       Cardiovascular hypertension, + dysrhythmias Atrial Fibrillation  Rhythm:regular Rate:Normal     Neuro/Psych    GI/Hepatic ,GERD  ,,  Endo/Other    Renal/GU      Musculoskeletal  (+) Arthritis ,    Abdominal   Peds  Hematology   Anesthesia Other Findings   Reproductive/Obstetrics                             Anesthesia Physical Anesthesia Plan  ASA: 3  Anesthesia Plan: MAC and Spinal   Post-op Pain Management:    Induction: Intravenous  PONV Risk Score and Plan: 1 and Propofol infusion, Ondansetron and Treatment may vary due to age or medical condition  Airway Management Planned: Simple Face Mask  Additional Equipment:   Intra-op Plan:   Post-operative Plan:   Informed Consent: I have reviewed the patients History and Physical, chart, labs and discussed the procedure including the risks, benefits and alternatives for the proposed anesthesia with the patient or authorized representative who has indicated his/her understanding and acceptance.     Dental advisory given  Plan Discussed with: CRNA, Anesthesiologist and Surgeon  Anesthesia Plan Comments: (PAT note written 04/08/2023 by Shonna Chock, PA-C.  )       Anesthesia Quick Evaluation

## 2023-04-14 ENCOUNTER — Other Ambulatory Visit: Payer: Medicare Other

## 2023-04-14 DIAGNOSIS — M1612 Unilateral primary osteoarthritis, left hip: Secondary | ICD-10-CM | POA: Insufficient documentation

## 2023-04-14 NOTE — H&P (Signed)
TOTAL HIP ADMISSION H&P  Patient is admitted for left total hip arthroplasty.  Subjective:  Chief Complaint: left hip pain  HPI: Aaron Ruiz, 67 y.o. male, has a history of pain and functional disability in the left hip(s) due to arthritis and patient has failed non-surgical conservative treatments for greater than 12 weeks to include NSAID's and/or analgesics, corticosteriod injections, and activity modification.  Onset of symptoms was gradual starting 2 years ago with gradually worsening course since that time.The patient noted no past surgery on the left hip(s).  Patient currently rates pain in the left hip at 8 out of 10 with activity. Patient has night pain, worsening of pain with activity and weight bearing, trendelenberg gait, pain that interfers with activities of daily living, and pain with passive range of motion. Patient has evidence of subchondral cysts, subchondral sclerosis, periarticular osteophytes, and joint space narrowing by imaging studies. This condition presents safety issues increasing the risk of falls.  There is no current active infection.  Patient Active Problem List   Diagnosis Date Noted   Unilateral primary osteoarthritis, left hip 04/14/2023   Elevated coronary artery calcium score 03/07/2023   Atrial flutter (HCC) 12/12/2022   OSA (obstructive sleep apnea) 11/22/2022   Basal cell carcinoma of nose 10/16/2020   Lentigo 10/16/2020   Neoplasm of uncertain behavior of skin 10/16/2020   Hyperlipidemia    Paroxysmal atrial fibrillation (HCC) 10/06/2020   Hypercoagulable state due to paroxysmal atrial fibrillation (HCC) 10/06/2020   Palpitations 09/21/2020   Daytime somnolence 09/21/2020   Fatigue 09/21/2020   Snoring 09/21/2020   Former smoker 09/21/2020   Vitamin D deficiency 09/21/2020   Obesity (BMI 30-39.9) 09/21/2020   Lactose intolerance    Hypertension    GERD (gastroesophageal reflux disease)    Diverticulosis    Colon polyps    Anemia     Seasonal and perennial allergic rhinitis 06/30/2018   Allergic conjunctivitis 06/30/2018   Mild intermittent asthma 06/30/2018   Chemosis of conjunctiva of both eyes 01/15/2017   Hx of eye surgery 2018   Basal cell carcinoma 2017   BPH (benign prostatic hyperplasia) 07/29/2014   Recurrent UTI 07/30/2013   Rash/urticaria 08/07/2012   Encounter for monitoring flecainide therapy 12/16/2011   Osteoarthritis 09/24/2011   Borderline diabetes 05/03/2011   Hyperglycemia 05/01/2011   Hypogonadism male 04/30/2011   General medical examination 11/14/2010   ACTINIC KERATOSIS 03/20/2009   Disorder of skin or subcutaneous tissue 03/20/2009   Hx of adenomatous colonic polyps 03/07/2009   Overweight 02/06/2009   ANEMIA-NOS 02/06/2009   Essential hypertension 02/06/2009   Asthma 02/06/2009   Past Medical History:  Diagnosis Date   Actinic keratosis 03/20/2009   Qualifier: Diagnosis of  By: Artist Pais DO, Barbette Hair    Allergic conjunctivitis 06/30/2018   Anemia    ANEMIA-NOS 02/06/2009   Qualifier: Diagnosis of  By: Terrilee Croak CMA, [name]     Asthma    uses inhaler   Basal cell carcinoma 2017   left side of nose   Basal cell carcinoma of nose 10/16/2020   Borderline diabetes 05/03/2011   BPH (benign prostatic hyperplasia) 07/29/2014   Chemosis of conjunctiva of both eyes 01/15/2017   Colon polyps    Daytime somnolence 09/21/2020   Diverticulosis    Dysrhythmia    Essential hypertension 02/06/2009   Qualifier: Diagnosis of  By: Terrilee Croak CMA, [name]      Fatigue 09/21/2020   Former smoker 09/21/2020   General medical examination 11/14/2010   GERD 02/06/2009  Qualifier: Diagnosis of  By: Terrilee Croak CMA, [name]     GERD (gastroesophageal reflux disease)    on meds   High cholesterol 02/06/2009   Qualifier: Diagnosis of  By: Terrilee Croak CMA, [name]     Hx of adenomatous colonic polyps 03/07/2009   Hx of eye surgery 2018   "Left Eye was swollen and had procedure to remove excess swelling"    Hyperglycemia 05/01/2011   Hyperlipidemia    Hypertension    Hypogonadism male 04/30/2011   Lactose intolerance    Lentigo 10/16/2020   Mild intermittent asthma 06/30/2018   Neoplasm of uncertain behavior of skin 10/16/2020   Obesity (BMI 30-39.9) 09/21/2020   OSA (obstructive sleep apnea) 11/22/2022   Osteoarthritis 09/24/2011   OVERWEIGHT 02/06/2009   Qualifier: Diagnosis of  By: Nena Jordan    Palpitations 09/21/2020   Paroxysmal atrial fibrillation (HCC) 10/06/2020   Rash/urticaria 08/07/2012   Recurrent UTI 07/30/2013   Routine general medical examination at a health care facility 12/16/2011   Secondary hypercoagulable state (HCC) 10/06/2020   SKIN LESION 03/20/2009   Qualifier: Diagnosis of  By: Nena Jordan    Snoring 09/21/2020   Unspecified asthma(493.90) 02/06/2009   Centricity Description: ASTHMA Qualifier: Diagnosis of  By: Terrilee Croak CMA, [name]   Centricity Description: ASTHMA, INTERMITTENT, MILD Qualifier: Diagnosis of  By: Peggyann Juba FNP, [name] S    URI (upper respiratory infection) 07/23/2011   Vitamin D deficiency 09/21/2020    Past Surgical History:  Procedure Laterality Date   ATRIAL FIBRILLATION ABLATION N/A 02/08/2022   Procedure: ATRIAL FIBRILLATION ABLATION;  Surgeon: Regan Lemming, MD;  [facility]: [facility];  Service: Cardiovascular;  Laterality: N/A;   CARDIOVERSION N/A 12/12/2022   Procedure: CARDIOVERSION;  Surgeon: Quintella Reichert, MD;  Location: [facility];  Service: Cardiovascular;  Laterality: N/A;   CYSTECTOMY  05/2004   removed fromback /sebaceous cyst   INGUINAL HERNIA REPAIR  1995   left   mohs procedure  2017   left side of nose   NASAL SEPTUM SURGERY      No current facility-administered medications for this encounter.   Current Outpatient Medications  Medication Sig Dispense Refill Last Dose   acetaminophen (TYLENOL) 500 MG tablet Take 1 tablet (500 mg total) by mouth every 6 (six) hours as needed. 30  tablet 5    albuterol (VENTOLIN HFA) 108 (90 Base) MCG/ACT inhaler Inhale 2 puffs into the lungs every 6 (six) hours as needed for wheezing or shortness of breath. 8 g 0    amiodarone (PACERONE) 200 MG tablet Take 1 tablet (200 mg total) by mouth daily. 90 tablet 1    amLODipine (NORVASC) 10 MG tablet Take 1 tablet (10 mg total) by mouth daily. 90 tablet 1    apixaban (ELIQUIS) 5 MG TABS tablet TAKE 1 TABLET(5 MG) BY MOUTH TWICE DAILY 60 tablet 5    cetirizine (ZYRTEC) 10 MG tablet Take 10 mg by mouth daily.      Cholecalciferol (VITAMIN D) 50 MCG (2000 UT) tablet Take 2,000 Units by mouth daily.      diphenhydrAMINE (BENADRYL) 25 MG tablet Take 25 mg by mouth at bedtime.      fluticasone (FLONASE) 50 MCG/ACT nasal spray Place 2 sprays into both nostrils daily as needed for allergies or rhinitis.      GLUCOSAMINE-CHONDROITIN PO Take 1 tablet by mouth daily.      guaiFENesin (MUCINEX) 600 MG 12 hr tablet Take 600 mg by mouth  as needed for to loosen phlegm or cough.      losartan-hydrochlorothiazide (HYZAAR) 100-25 MG tablet Take 1 tablet by mouth daily. 90 tablet 1    metoprolol succinate (TOPROL-XL) 25 MG 24 hr tablet 1 daily 90 tablet 1    naphazoline-glycerin (CLEAR EYES REDNESS) 0.012-0.25 % SOLN Place 1-2 drops into both eyes 4 (four) times daily as needed for eye irritation.      Omega-3 Fatty Acids (FISH OIL) 1200 MG CAPS Take 1,200 mg by mouth daily.      omeprazole (PRILOSEC) 40 MG capsule TAKE 1 CAPSULE(40 MG) BY MOUTH DAILY 90 capsule 1    pravastatin (PRAVACHOL) 40 MG tablet TAKE 1 TABLET(40 MG) BY MOUTH DAILY 90 tablet 1    tamsulosin (FLOMAX) 0.4 MG CAPS capsule Take 1 capsule (0.4 mg total) by mouth daily. 90 capsule 1    Turmeric Curcumin 500 MG CAPS Take 500 mg by mouth daily.      NON FORMULARY Pt uses c-pap nightly      sildenafil (REVATIO) 20 MG tablet 1-2 tabs by mouth prior to sexual activity 30 tablet 5    Allergies  Allergen Reactions   Sulfonamide Derivatives      Hallucination (Childhood Reaction)    Social History   Tobacco Use   Smoking status: Former    Current packs/day: 0.00    Types: Cigarettes    Start date: 07/01/1969    Quit date: 07/01/1974    Years since quitting: 48.8   Smokeless tobacco: Never   Tobacco comments:    Former smoker 03/08/22  Substance Use Topics   Alcohol use: No    Alcohol/week: 0.0 standard drinks of alcohol    Family History  Problem Relation Age of Onset   Coronary artery disease Father        CABG X5   Diabetes Father    Colon cancer Maternal Aunt    Cancer Brother        lung (smoker)   Hypertension Other    Kidney disease Other      Review of Systems  Objective:  Physical Exam Vitals reviewed.  Constitutional:      Appearance: Normal appearance. He is normal weight.  HENT:     Head: Normocephalic and atraumatic.  Eyes:     Extraocular Movements: Extraocular movements intact.     Pupils: Pupils are equal, round, and reactive to light.  Cardiovascular:     Rate and Rhythm: Normal rate.     Pulses: Normal pulses.  Pulmonary:     Effort: Pulmonary effort is normal.     Breath sounds: Normal breath sounds.  Abdominal:     Palpations: Abdomen is soft.  Musculoskeletal:     Cervical back: Normal range of motion and neck supple.     Left hip: Tenderness and bony tenderness present. Decreased range of motion. Decreased strength.  Neurological:     Mental Status: He is alert and oriented to person, place, and time.  Psychiatric:        Behavior: Behavior normal.     Vital signs in last 24 hours:    Labs:   Estimated body mass index is 33.09 kg/m as calculated from the following:   Height as of 04/07/23: 5\' 7"  (1.702 m).   Weight as of 04/07/23: 95.8 kg.   Imaging Review Plain radiographs demonstrate severe degenerative joint disease of the left hip(s). The bone quality appears to be excellent for age and reported activity level.  Assessment/Plan:  End stage arthritis,  left hip(s)  The patient history, physical examination, clinical judgement of the provider and imaging studies are consistent with end stage degenerative joint disease of the left hip(s) and total hip arthroplasty is deemed medically necessary. The treatment options including medical management, injection therapy, arthroscopy and arthroplasty were discussed at length. The risks and benefits of total hip arthroplasty were presented and reviewed. The risks due to aseptic loosening, infection, stiffness, dislocation/subluxation,  thromboembolic complications and other imponderables were discussed.  The patient acknowledged the explanation, agreed to proceed with the plan and consent was signed. Patient is being admitted for inpatient treatment for surgery, pain control, PT, OT, prophylactic antibiotics, VTE prophylaxis, progressive ambulation and ADL's and discharge planning.The patient is planning to be discharged home with home health services

## 2023-04-15 ENCOUNTER — Ambulatory Visit (HOSPITAL_COMMUNITY): Payer: Medicare Other

## 2023-04-15 ENCOUNTER — Observation Stay (HOSPITAL_COMMUNITY)
Admission: RE | Admit: 2023-04-15 | Discharge: 2023-04-16 | Disposition: A | Discharge status: Home Health | Payer: Medicare Other | Source: Ambulatory Visit | Attending: Orthopaedic Surgery | Admitting: Orthopaedic Surgery

## 2023-04-15 ENCOUNTER — Other Ambulatory Visit: Payer: Self-pay

## 2023-04-15 ENCOUNTER — Observation Stay (HOSPITAL_COMMUNITY): Payer: Medicare Other

## 2023-04-15 ENCOUNTER — Encounter (HOSPITAL_COMMUNITY)
Admission: RE | Disposition: A | Discharge status: Home Health | Payer: Self-pay | Source: Ambulatory Visit | Attending: Orthopaedic Surgery

## 2023-04-15 ENCOUNTER — Encounter (HOSPITAL_COMMUNITY): Payer: Self-pay | Admitting: Orthopaedic Surgery

## 2023-04-15 ENCOUNTER — Ambulatory Visit (HOSPITAL_COMMUNITY): Payer: Medicare Other | Admitting: Vascular Surgery

## 2023-04-15 DIAGNOSIS — I10 Essential (primary) hypertension: Secondary | ICD-10-CM | POA: Diagnosis not present

## 2023-04-15 DIAGNOSIS — M169 Osteoarthritis of hip, unspecified: Secondary | ICD-10-CM | POA: Diagnosis present

## 2023-04-15 DIAGNOSIS — M1612 Unilateral primary osteoarthritis, left hip: Secondary | ICD-10-CM | POA: Diagnosis not present

## 2023-04-15 DIAGNOSIS — I480 Paroxysmal atrial fibrillation: Secondary | ICD-10-CM | POA: Diagnosis not present

## 2023-04-15 DIAGNOSIS — Z7901 Long term (current) use of anticoagulants: Secondary | ICD-10-CM | POA: Diagnosis not present

## 2023-04-15 DIAGNOSIS — Z87891 Personal history of nicotine dependence: Secondary | ICD-10-CM | POA: Diagnosis not present

## 2023-04-15 DIAGNOSIS — Z85828 Personal history of other malignant neoplasm of skin: Secondary | ICD-10-CM | POA: Insufficient documentation

## 2023-04-15 DIAGNOSIS — J45909 Unspecified asthma, uncomplicated: Secondary | ICD-10-CM | POA: Diagnosis not present

## 2023-04-15 DIAGNOSIS — Z96642 Presence of left artificial hip joint: Secondary | ICD-10-CM

## 2023-04-15 DIAGNOSIS — Z79899 Other long term (current) drug therapy: Secondary | ICD-10-CM | POA: Diagnosis not present

## 2023-04-15 DIAGNOSIS — Z471 Aftercare following joint replacement surgery: Secondary | ICD-10-CM | POA: Diagnosis not present

## 2023-04-15 HISTORY — PX: TOTAL HIP ARTHROPLASTY: SHX124

## 2023-04-15 LAB — ABO/RH: ABO/RH(D): O POS

## 2023-04-15 SURGERY — ARTHROPLASTY, HIP, TOTAL, ANTERIOR APPROACH
Anesthesia: Monitor Anesthesia Care | Site: Hip | Laterality: Left

## 2023-04-15 MED ORDER — OXYCODONE HCL 5 MG PO TABS
10.0000 mg | ORAL_TABLET | ORAL | Status: DC | PRN
  Administered 2023-04-15 (×2): 10 mg via ORAL
  Filled 2023-04-15 (×2): qty 2

## 2023-04-15 MED ORDER — VITAMIN D 25 MCG (1000 UNIT) PO TABS
2000.0000 [IU] | ORAL_TABLET | Freq: Every day | ORAL | Status: DC
  Filled 2023-04-15 (×2): qty 2

## 2023-04-15 MED ORDER — ONDANSETRON HCL 4 MG/2ML IJ SOLN
INTRAMUSCULAR | Status: DC | PRN
  Administered 2023-04-15: 4 mg via INTRAVENOUS

## 2023-04-15 MED ORDER — EPHEDRINE 5 MG/ML INJ
INTRAVENOUS | Status: AC
  Filled 2023-04-15: qty 5

## 2023-04-15 MED ORDER — ACETAMINOPHEN 325 MG PO TABS: 325.0000 mg | ORAL_TABLET | Freq: Four times a day (QID) | ORAL | Status: DC | PRN

## 2023-04-15 MED ORDER — FENTANYL CITRATE (PF) 250 MCG/5ML IJ SOLN
INTRAMUSCULAR | Status: AC
  Filled 2023-04-15: qty 5

## 2023-04-15 MED ORDER — CEFAZOLIN SODIUM-DEXTROSE 2-4 GM/100ML-% IV SOLN
INTRAVENOUS | Status: AC
  Filled 2023-04-15: qty 100

## 2023-04-15 MED ORDER — AMLODIPINE BESYLATE 10 MG PO TABS
10.0000 mg | ORAL_TABLET | Freq: Every day | ORAL | Status: DC
  Administered 2023-04-16: 10 mg via ORAL
  Filled 2023-04-15: qty 1

## 2023-04-15 MED ORDER — ONDANSETRON HCL 4 MG/2ML IJ SOLN: 4.0000 mg | Freq: Four times a day (QID) | INTRAMUSCULAR | Status: DC | PRN

## 2023-04-15 MED ORDER — LOSARTAN POTASSIUM 50 MG PO TABS
100.0000 mg | ORAL_TABLET | Freq: Every day | ORAL | Status: DC
  Administered 2023-04-16: 100 mg via ORAL
  Filled 2023-04-15 (×2): qty 2

## 2023-04-15 MED ORDER — NAPHAZOLINE-GLYCERIN 0.012-0.25 % OP SOLN: 1.0000 [drp] | Freq: Four times a day (QID) | OPHTHALMIC | Status: DC | PRN

## 2023-04-15 MED ORDER — OXYCODONE HCL 5 MG PO TABS: 5.0000 mg | ORAL_TABLET | ORAL | Status: DC | PRN

## 2023-04-15 MED ORDER — POVIDONE-IODINE 10 % EX SWAB
2.0000 | Freq: Once | CUTANEOUS | Status: AC
  Administered 2023-04-15: 2 via TOPICAL

## 2023-04-15 MED ORDER — CHLORHEXIDINE GLUCONATE 0.12 % MT SOLN: 15.0000 mL | Freq: Once | OROMUCOSAL | Status: AC

## 2023-04-15 MED ORDER — ORAL CARE MOUTH RINSE: 15.0000 mL | Freq: Once | OROMUCOSAL | Status: AC

## 2023-04-15 MED ORDER — ALUM & MAG HYDROXIDE-SIMETH 200-200-20 MG/5ML PO SUSP: 30.0000 mL | ORAL | Status: DC | PRN

## 2023-04-15 MED ORDER — LACTATED RINGERS IV SOLN: INTRAVENOUS | Status: DC

## 2023-04-15 MED ORDER — 0.9 % SODIUM CHLORIDE (POUR BTL) OPTIME
TOPICAL | Status: DC | PRN
  Administered 2023-04-15: 1000 mL

## 2023-04-15 MED ORDER — OXYCODONE HCL 5 MG PO TABS: 5.0000 mg | ORAL_TABLET | Freq: Once | ORAL | Status: DC | PRN

## 2023-04-15 MED ORDER — HYDROMORPHONE HCL 1 MG/ML IJ SOLN: 0.5000 mg | INTRAMUSCULAR | Status: DC | PRN

## 2023-04-15 MED ORDER — ALBUTEROL SULFATE (2.5 MG/3ML) 0.083% IN NEBU: 2.5000 mg | INHALATION_SOLUTION | Freq: Four times a day (QID) | RESPIRATORY_TRACT | Status: DC | PRN

## 2023-04-15 MED ORDER — ONDANSETRON HCL 4 MG PO TABS: 4.0000 mg | ORAL_TABLET | Freq: Four times a day (QID) | ORAL | Status: DC | PRN

## 2023-04-15 MED ORDER — LOSARTAN POTASSIUM-HCTZ 100-25 MG PO TABS: 1.0000 | ORAL_TABLET | Freq: Every day | ORAL | Status: DC

## 2023-04-15 MED ORDER — METHOCARBAMOL 1000 MG/10ML IJ SOLN: 500.0000 mg | Freq: Four times a day (QID) | INTRAVENOUS | Status: DC | PRN

## 2023-04-15 MED ORDER — PROPOFOL 500 MG/50ML IV EMUL
INTRAVENOUS | Status: DC | PRN
  Administered 2023-04-15: 70 ug/kg/min via INTRAVENOUS

## 2023-04-15 MED ORDER — DEXAMETHASONE SODIUM PHOSPHATE 10 MG/ML IJ SOLN
INTRAMUSCULAR | Status: DC | PRN
  Administered 2023-04-15: 10 mg via INTRAVENOUS

## 2023-04-15 MED ORDER — METOCLOPRAMIDE HCL 5 MG PO TABS: 5.0000 mg | ORAL_TABLET | Freq: Three times a day (TID) | ORAL | Status: DC | PRN

## 2023-04-15 MED ORDER — TRANEXAMIC ACID-NACL 1000-0.7 MG/100ML-% IV SOLN
1000.0000 mg | INTRAVENOUS | Status: AC
  Administered 2023-04-15: 1000 mg via INTRAVENOUS

## 2023-04-15 MED ORDER — LIDOCAINE 2% (20 MG/ML) 5 ML SYRINGE
INTRAMUSCULAR | Status: DC | PRN
  Administered 2023-04-15: 60 mg via INTRAVENOUS

## 2023-04-15 MED ORDER — ONDANSETRON HCL 4 MG/2ML IJ SOLN
INTRAMUSCULAR | Status: AC
  Filled 2023-04-15: qty 2

## 2023-04-15 MED ORDER — PROPOFOL 1000 MG/100ML IV EMUL
INTRAVENOUS | Status: AC
  Filled 2023-04-15: qty 300

## 2023-04-15 MED ORDER — CEFAZOLIN SODIUM-DEXTROSE 1-4 GM/50ML-% IV SOLN
1.0000 g | Freq: Four times a day (QID) | INTRAVENOUS | Status: AC
  Administered 2023-04-15 (×2): 1 g via INTRAVENOUS
  Filled 2023-04-15 (×2): qty 50

## 2023-04-15 MED ORDER — CEFAZOLIN SODIUM-DEXTROSE 2-4 GM/100ML-% IV SOLN
2.0000 g | INTRAVENOUS | Status: AC
  Administered 2023-04-15: 2 g via INTRAVENOUS

## 2023-04-15 MED ORDER — SODIUM CHLORIDE 0.9 % IV SOLN: INTRAVENOUS | Status: DC

## 2023-04-15 MED ORDER — MIDAZOLAM HCL 2 MG/2ML IJ SOLN
INTRAMUSCULAR | Status: DC | PRN
  Administered 2023-04-15: 2 mg via INTRAVENOUS

## 2023-04-15 MED ORDER — CHLORHEXIDINE GLUCONATE 0.12 % MT SOLN
OROMUCOSAL | Status: AC
  Administered 2023-04-15: 15 mL via OROMUCOSAL
  Filled 2023-04-15: qty 15

## 2023-04-15 MED ORDER — HYDROCHLOROTHIAZIDE 25 MG PO TABS
25.0000 mg | ORAL_TABLET | Freq: Every day | ORAL | Status: DC
  Filled 2023-04-15 (×2): qty 1

## 2023-04-15 MED ORDER — LIDOCAINE 2% (20 MG/ML) 5 ML SYRINGE
INTRAMUSCULAR | Status: AC
  Filled 2023-04-15: qty 5

## 2023-04-15 MED ORDER — DIPHENHYDRAMINE HCL 25 MG PO CAPS
25.0000 mg | ORAL_CAPSULE | Freq: Every day | ORAL | Status: DC
  Administered 2023-04-15: 25 mg via ORAL
  Filled 2023-04-15: qty 1

## 2023-04-15 MED ORDER — DIPHENHYDRAMINE HCL 12.5 MG/5ML PO ELIX: 12.5000 mg | ORAL_SOLUTION | ORAL | Status: DC | PRN

## 2023-04-15 MED ORDER — METHOCARBAMOL 500 MG PO TABS: 500.0000 mg | ORAL_TABLET | Freq: Four times a day (QID) | ORAL | Status: DC | PRN

## 2023-04-15 MED ORDER — MIDAZOLAM HCL 2 MG/2ML IJ SOLN
INTRAMUSCULAR | Status: AC
  Filled 2023-04-15: qty 2

## 2023-04-15 MED ORDER — DEXAMETHASONE SODIUM PHOSPHATE 10 MG/ML IJ SOLN
INTRAMUSCULAR | Status: AC
  Filled 2023-04-15: qty 1

## 2023-04-15 MED ORDER — METOCLOPRAMIDE HCL 5 MG/ML IJ SOLN: 5.0000 mg | Freq: Three times a day (TID) | INTRAMUSCULAR | Status: DC | PRN

## 2023-04-15 MED ORDER — PROPOFOL 1000 MG/100ML IV EMUL
INTRAVENOUS | Status: AC
  Filled 2023-04-15: qty 100

## 2023-04-15 MED ORDER — SUCCINYLCHOLINE CHLORIDE 200 MG/10ML IV SOSY
PREFILLED_SYRINGE | INTRAVENOUS | Status: AC
  Filled 2023-04-15: qty 10

## 2023-04-15 MED ORDER — PHENYLEPHRINE HCL-NACL 20-0.9 MG/250ML-% IV SOLN
INTRAVENOUS | Status: AC
  Filled 2023-04-15: qty 750

## 2023-04-15 MED ORDER — FENTANYL CITRATE (PF) 100 MCG/2ML IJ SOLN: 25.0000 ug | INTRAMUSCULAR | Status: DC | PRN

## 2023-04-15 MED ORDER — AMIODARONE HCL 200 MG PO TABS
200.0000 mg | ORAL_TABLET | Freq: Every day | ORAL | Status: DC
  Administered 2023-04-16: 200 mg via ORAL
  Filled 2023-04-15 (×2): qty 1

## 2023-04-15 MED ORDER — PHENYLEPHRINE 80 MCG/ML (10ML) SYRINGE FOR IV PUSH (FOR BLOOD PRESSURE SUPPORT)
PREFILLED_SYRINGE | INTRAVENOUS | Status: AC
  Filled 2023-04-15: qty 10

## 2023-04-15 MED ORDER — BUPIVACAINE IN DEXTROSE 0.75-8.25 % IT SOLN
INTRATHECAL | Status: DC | PRN
Start: 2023-04-15 — End: 2023-04-15
  Administered 2023-04-15: 1.8 mL via INTRATHECAL

## 2023-04-15 MED ORDER — METOPROLOL SUCCINATE ER 25 MG PO TB24
25.0000 mg | ORAL_TABLET | Freq: Every day | ORAL | Status: DC
  Administered 2023-04-16: 25 mg via ORAL
  Filled 2023-04-15: qty 1

## 2023-04-15 MED ORDER — DOCUSATE SODIUM 100 MG PO CAPS
100.0000 mg | ORAL_CAPSULE | Freq: Two times a day (BID) | ORAL | Status: DC
  Administered 2023-04-15 – 2023-04-16 (×3): 100 mg via ORAL
  Filled 2023-04-15 (×4): qty 1

## 2023-04-15 MED ORDER — FENTANYL CITRATE (PF) 250 MCG/5ML IJ SOLN
INTRAMUSCULAR | Status: DC | PRN
  Administered 2023-04-15: 75 ug via INTRAVENOUS

## 2023-04-15 MED ORDER — TAMSULOSIN HCL 0.4 MG PO CAPS
0.4000 mg | ORAL_CAPSULE | Freq: Every day | ORAL | Status: DC
  Administered 2023-04-16: 0.4 mg via ORAL
  Filled 2023-04-15: qty 1

## 2023-04-15 MED ORDER — PHENOL 1.4 % MT LIQD: 1.0000 | OROMUCOSAL | Status: DC | PRN

## 2023-04-15 MED ORDER — MENTHOL 3 MG MT LOZG: 1.0000 | LOZENGE | OROMUCOSAL | Status: DC | PRN

## 2023-04-15 MED ORDER — TRANEXAMIC ACID-NACL 1000-0.7 MG/100ML-% IV SOLN
INTRAVENOUS | Status: AC
  Filled 2023-04-15: qty 100

## 2023-04-15 MED ORDER — PANTOPRAZOLE SODIUM 40 MG PO TBEC
40.0000 mg | DELAYED_RELEASE_TABLET | Freq: Every day | ORAL | Status: DC
  Administered 2023-04-16: 40 mg via ORAL
  Filled 2023-04-15 (×2): qty 1

## 2023-04-15 MED ORDER — APIXABAN 5 MG PO TABS
5.0000 mg | ORAL_TABLET | Freq: Two times a day (BID) | ORAL | Status: DC
  Administered 2023-04-16: 5 mg via ORAL
  Filled 2023-04-15: qty 1

## 2023-04-15 MED ORDER — OXYCODONE HCL 5 MG/5ML PO SOLN: 5.0000 mg | Freq: Once | ORAL | Status: DC | PRN

## 2023-04-15 MED ORDER — PROPOFOL 10 MG/ML IV BOLUS
INTRAVENOUS | Status: DC | PRN
  Administered 2023-04-15: 30 mg via INTRAVENOUS

## 2023-04-15 MED ORDER — PROPOFOL 10 MG/ML IV BOLUS
INTRAVENOUS | Status: AC
  Filled 2023-04-15: qty 20

## 2023-04-15 SURGICAL SUPPLY — 57 items
APL SKNCLS STERI-STRIP NONHPOA (GAUZE/BANDAGES/DRESSINGS)
BAG COUNTER SPONGE SURGICOUNT (BAG) ×2 IMPLANT
BAG SPNG CNTER NS LX DISP (BAG) ×1
BENZOIN TINCTURE PRP APPL 2/3 (GAUZE/BANDAGES/DRESSINGS) ×2 IMPLANT
BLADE CLIPPER SURG (BLADE) IMPLANT
BLADE SAW SGTL 18X1.27X75 (BLADE) ×2 IMPLANT
COVER SURGICAL LIGHT HANDLE (MISCELLANEOUS) ×2 IMPLANT
DRAPE C-ARM 42X72 X-RAY (DRAPES) ×2 IMPLANT
DRAPE STERI IOBAN 125X83 (DRAPES) ×2 IMPLANT
DRAPE U-SHAPE 47X51 STRL (DRAPES) ×6 IMPLANT
DRSG AQUACEL AG ADV 3.5X10 (GAUZE/BANDAGES/DRESSINGS) ×2 IMPLANT
DURAPREP 26ML APPLICATOR (WOUND CARE) ×2 IMPLANT
ELECT BLADE 4.0 EZ CLEAN MEGAD (MISCELLANEOUS) ×1
ELECT BLADE 6.5 EXT (BLADE) IMPLANT
ELECT REM PT RETURN 9FT ADLT (ELECTROSURGICAL) ×1
ELECTRODE BLDE 4.0 EZ CLN MEGD (MISCELLANEOUS) ×2 IMPLANT
ELECTRODE REM PT RTRN 9FT ADLT (ELECTROSURGICAL) ×2 IMPLANT
FACESHIELD WRAPAROUND (MASK) ×3 IMPLANT
FACESHIELD WRAPAROUND OR TEAM (MASK) ×4 IMPLANT
FEM STEM 12/14 TAPER SZ 4 HIP (Orthopedic Implant) ×1 IMPLANT
FEMORAL STEM 12/14 TPR SZ4 HIP (Orthopedic Implant) IMPLANT
GLOVE BIOGEL PI IND STRL 8 (GLOVE) ×4 IMPLANT
GLOVE ECLIPSE 8.0 STRL XLNG CF (GLOVE) ×2 IMPLANT
GLOVE ORTHO TXT STRL SZ7.5 (GLOVE) ×4 IMPLANT
GOWN STRL REUS W/ TWL LRG LVL3 (GOWN DISPOSABLE) ×4 IMPLANT
GOWN STRL REUS W/ TWL XL LVL3 (GOWN DISPOSABLE) ×4 IMPLANT
GOWN STRL REUS W/TWL LRG LVL3 (GOWN DISPOSABLE) ×2
GOWN STRL REUS W/TWL XL LVL3 (GOWN DISPOSABLE) ×2
HANDPIECE INTERPULSE COAX TIP (DISPOSABLE)
HEAD CERAMIC 36 PLUS5 (Hips) IMPLANT
KIT BASIN OR (CUSTOM PROCEDURE TRAY) ×2 IMPLANT
KIT TURNOVER KIT B (KITS) ×2 IMPLANT
LINER NEUTRAL 52X36MM PLUS 4 (Liner) IMPLANT
MANIFOLD NEPTUNE II (INSTRUMENTS) ×2 IMPLANT
NS IRRIG 1000ML POUR BTL (IV SOLUTION) ×2 IMPLANT
PACK TOTAL JOINT (CUSTOM PROCEDURE TRAY) ×2 IMPLANT
PAD ARMBOARD 7.5X6 YLW CONV (MISCELLANEOUS) ×2 IMPLANT
PIN SECTOR W/GRIP ACE CUP 52MM (Hips) IMPLANT
SET HNDPC FAN SPRY TIP SCT (DISPOSABLE) ×2 IMPLANT
STAPLER VISISTAT 35W (STAPLE) IMPLANT
STRIP CLOSURE SKIN 1/2X4 (GAUZE/BANDAGES/DRESSINGS) ×4 IMPLANT
SUT ETHIBOND NAB CT1 #1 30IN (SUTURE) ×2 IMPLANT
SUT MNCRL AB 4-0 PS2 18 (SUTURE) IMPLANT
SUT VIC AB 0 CT1 27 (SUTURE) ×1
SUT VIC AB 0 CT1 27XBRD ANBCTR (SUTURE) ×2 IMPLANT
SUT VIC AB 1 CT1 27 (SUTURE) ×1
SUT VIC AB 1 CT1 27XBRD ANBCTR (SUTURE) ×2 IMPLANT
SUT VIC AB 2-0 CT1 27 (SUTURE) ×1
SUT VIC AB 2-0 CT1 TAPERPNT 27 (SUTURE) ×2 IMPLANT
TOWEL GREEN STERILE (TOWEL DISPOSABLE) ×2 IMPLANT
TOWEL GREEN STERILE FF (TOWEL DISPOSABLE) ×2 IMPLANT
TRAY CATH INTERMITTENT SS 16FR (CATHETERS) IMPLANT
TRAY FOL W/BAG SLVR 16FR STRL (SET/KITS/TRAYS/PACK) IMPLANT
TRAY FOLEY W/BAG SLVR 16FR (SET/KITS/TRAYS/PACK)
TRAY FOLEY W/BAG SLVR 16FR LF (SET/KITS/TRAYS/PACK) ×1
TRAY FOLEY W/BAG SLVR 16FR ST (SET/KITS/TRAYS/PACK) IMPLANT
WATER STERILE IRR 1000ML POUR (IV SOLUTION) ×4 IMPLANT

## 2023-04-15 NOTE — Evaluation (Signed)
Physical Therapy Evaluation Patient Details Name: Aaron Ruiz MRN: [id number] DOB: 10-06-1955 Today's Date: 04/15/2023  History of Present Illness  67 year old male presents to [facility] hospital on 04/15/2023 for elective L THA. PMH of hip OA, HTN, hyperlipidemia,and diverticulosis.  Clinical Impression  Pt presents to PT with deficits in hip ROM, decreased activity tolerance, and gait deviations. Pt was able to tolerate ambulating 100 feet within the room with the use of a rolling walker. Pt was able to complete bed mobility with minimum assistance. PT plan to progress to stair training tomorrow. Pt owns rollator, PT will like to attempt ambulation with rollator if appropriate. Pt will continue to benefit from skilled physical therapy to return to prior level of function.      If plan is discharge home, recommend the following: A little help with walking and/or transfers;Help with stairs or ramp for entrance;Assistance with cooking/housework   Can travel by private vehicle        Equipment Recommendations Rolling walker (2 wheels) (plan to assess gait with rollator)  Recommendations for Other Services       Functional Status Assessment Patient has had a recent decline in their functional status and demonstrates the ability to make significant improvements in function in a reasonable and predictable amount of time.     Precautions / Restrictions Precautions Precautions: Fall Precaution Comments: Direct anterior hip, no precautions Restrictions Weight Bearing Restrictions: No      Mobility  Bed Mobility Overal bed mobility: Needs Assistance Bed Mobility: Supine to Sit, Sit to Supine     Supine to sit: Min assist Sit to supine: Contact guard assist   General bed mobility comments: Pt needed minimum assistance with supine to sit    Transfers Overall transfer level: Needs assistance Equipment used: Rolling walker (2 wheels) Transfers: Sit to/from Stand Sit to Stand:  Contact guard assist                Ambulation/Gait Ambulation/Gait assistance: Contact guard assist Gait Distance (Feet): 100 Feet Assistive device: Rolling walker (2 wheels) Gait Pattern/deviations: Step-to pattern   Gait velocity interpretation: <1.31 ft/sec, indicative of household ambulator   General Gait Details: Slow, step to gait  Stairs            Wheelchair Mobility     Tilt Bed    Modified Rankin (Stroke Patients Only)       Balance Overall balance assessment: Needs assistance Sitting-balance support: Feet supported, No upper extremity supported Sitting balance-Leahy Scale: Good     Standing balance support: Bilateral upper extremity supported, Reliant on assistive device for balance Standing balance-Leahy Scale: Poor                               Pertinent Vitals/Pain Pain Assessment Pain Assessment: No/denies pain    Home Living Family/patient expects to be discharged to:: Private residence Living Arrangements: Spouse/significant other Available Help at Discharge: Family Type of Home: House Home Access: Ramped entrance       Home Layout: Two level Home Equipment: Shower seat;Grab bars - tub/shower;Cane - single Librarian, academic (2 wheels) Additional Comments: Basement has 13 steps    Prior Function Prior Level of Function : Independent/Modified Independent                     Extremity/Trunk Assessment        Lower Extremity Assessment Lower Extremity Assessment: LLE deficits/detail LLE Deficits /  Details: Generalized muscle weakness LLE Sensation: WNL LLE Coordination: WNL    Cervical / Trunk Assessment Cervical / Trunk Assessment: Normal  Communication      Cognition Arousal: Alert Behavior During Therapy: WFL for tasks assessed/performed Overall Cognitive Status: Within Functional Limits for tasks assessed                                          General Comments General  comments (skin integrity, edema, etc.): asymptomatic    Exercises Other Exercises Other Exercises: PT verbally reviewed and demonstrated hip exercise packet   Assessment/Plan    PT Assessment Patient needs continued PT services  PT Problem List Decreased range of motion;Decreased activity tolerance;Decreased strength;Decreased balance;Decreased mobility;Pain;Decreased knowledge of use of DME       PT Treatment Interventions Gait training;Therapeutic activities;Stair training;Functional mobility training;Balance training;Therapeutic exercise;Patient/family education    PT Goals (Current goals can be found in the Care Plan section)  Acute Rehab PT Goals Patient Stated Goal: To return home PT Goal Formulation: With patient/family Time For Goal Achievement: 04/19/23 Potential to Achieve Goals: Good    Frequency Min 1X/week     Co-evaluation               AM-PAC PT "6 Clicks" Mobility  Outcome Measure Help needed turning from your back to your side while in a flat bed without using bedrails?: A Little Help needed moving from lying on your back to sitting on the side of a flat bed without using bedrails?: A Little Help needed moving to and from a bed to a chair (including a wheelchair)?: A Little Help needed standing up from a chair using your arms (e.g., wheelchair or bedside chair)?: A Little Help needed to walk in hospital room?: A Little Help needed climbing 3-5 steps with a railing? : A Lot 6 Click Score: 17    End of Session Equipment Utilized During Treatment: Gait belt Activity Tolerance: Patient tolerated treatment well Patient left: in bed;with family/visitor present;with call bell/phone within reach;with bed alarm set   PT Visit Diagnosis: Muscle weakness (generalized) (M62.81);Other abnormalities of gait and mobility (R26.89)    Time: 1342-1410 PT Time Calculation (min) (ACUTE ONLY): 28 min   Charges:   PT Evaluation $PT Eval Low Complexity: 1 Low PT  Treatments $Gait Training: 8-22 mins PT General Charges $$ ACUTE PT VISIT: 1 Visit         Caryl Comes, SPT Acute Rehab Office [phone]  Caryl Comes 04/15/2023, 3:54 PM

## 2023-04-15 NOTE — Interval H&P Note (Signed)
History and Physical Interval Note: The patient understands that he is here today for a left total hip replacement to treat his severe left hip arthritis.  There has been no acute or interval change in medical status.  The risks and benefits of surgery have been discussed in detail and informed consent has been obtained.  The left operative hip has been marked.  04/15/2023 7:18 AM  Aaron Ruiz  has presented today for surgery, with the diagnosis of left hip osteoarthritis.  The various methods of treatment have been discussed with the patient and family. After consideration of risks, benefits and other options for treatment, the patient has consented to  Procedure(s): LEFT TOTAL HIP ARTHROPLASTY ANTERIOR APPROACH (Left) as a surgical intervention.  The patient's history has been reviewed, patient examined, no change in status, stable for surgery.  I have reviewed the patient's chart and labs.  Questions were answered to the patient's satisfaction.     Kathryne Hitch

## 2023-04-15 NOTE — Op Note (Signed)
Operative Note  Date of operation: 04/15/2023 Preoperative diagnosis: Left hip primary osteoarthritis Postoperative diagnosis: Same  Procedure: Left direct anterior total hip arthroplasty  Implants: Implant Name Type Inv. Item Serial No. Manufacturer Lot No. LRB No. Used Action  PIN SECTOR W/GRIP ACE CUP - [id number] Hips PIN SECTOR W/GRIP ACE CUP  [employer]PU[employer] [id number] Left 1 Implanted  LINER NEUTRAL [id number] PLUS 4 - [id number] Liner LINER NEUTRAL [id number] PLUS 4  DEPUY ORTHOPA[employer] [id number] Left 1 Implanted  FEM STEM 12/14 TAPER SZ 4 HIP - [id number] Orthopedic Implant FEM STEM 12/14 TAPER SZ 4 HIP  DEPUY ORTH[employer] [id number] Left 1 Implanted  HEAD CERAMIC 36 PLUS5 - [id number] Hips HEAD CERAMIC 36 PLUS5  DEPUY ORTHOPA[employer] [id number] Left 1 Implanted   Surgeon: Vanita Panda. Magnus Ivan, MD Assistant: Rexene Edison, PA-C  Anesthesia: Spinal EBL: 150 cc Antibiotics: 2 g IV Ancef Complications: None  Indications: The patient is a 67 year old gentleman with debilitating arthritis involving his left hip that has been well-documented with clinical exam and x-ray findings.  His left hip pain is daily and it is detrimentally affecting his mobility, his quality of life, and his actives daily living to the point he does wish to proceed with a total hip arthroplasty.  He has tried and failed conservative treatment for a long period of time.  We did discuss the risk of acute blood loss anemia, nerve vessel injury, fracture, infection, dislocation, DVT, implant failure, leg length differences and wound healing issues.  He understands that our goals are hopefully decrease pain, improve mobility, and improve quality of life.  Procedure description: After informed consent was obtained and the appropriate left hip was marked, the patient was brought to the operating room and set up on the stretcher where spinal anesthesia was obtained.  He was then laid in supine position on the stretcher.  A  Foley catheter was placed.  We were able to assess his leg lengths and traction boots were placed on both his feet.  Next he was placed supine on the Hana fracture table with a perineal post in place in both legs and inline skeletal traction devices but no traction applied.  His left operative hip and pelvis were assessed radiographically.  The left hip was prepped and draped with DuraPrep and sterile drapes.  A timeout was called and he was identified as correct patient the correct left hip.  An incision was then made just inferior and posterior to the ASIS and carried slightly obliquely down the leg.  Dissection was carried down to the tensor fascia lata muscle and the tensor fascia was then divided longitudinally to proceed with a direct anterior approach to the hip.  Circumflex vessels were identified and cauterized.  The hip capsule was identified and opened up in L-type format finding a mild joint effusion.  There was significant arthritic changes around the lateral femoral head neck.  Cobra retractors placed around the medial and lateral femoral neck and a femoral neck cut was made with an oscillating saw just proximal to the lesser trochanter.  He had a very short neck.  This cut was completed with an osteotome.  A corkscrew guide was placed in the femoral head and the femoral head was removed in its entirety and there was a wide area devoid of cartilage.  A bent Hohmann was then placed over the medial acetabular rim and remnants of the acetabular labrum and other debris removed.  Reaming was then initiated from a size 43 reamer and stepwise  increments going up to a size 51 reamer with all reamers placed under direct visualization and the last reamer was also placed under direct fluoroscopy in order to obtain the depth and reaming, the inclination and the anteversion.  The real DePuy sector GRIPTION acetabular component size 52 was then placed without difficulty under direct visualization and fluoroscopy.   Based on medialization we will with a 36+4 polythene liner.  Attention was then turned to the femur.  With the left leg externally rotated to 120 degrees, extended and adducted, a Mueller retractor was placed medially and a Hohmann retractor was placed behind the greater trochanter.  The lateral joint capsule was released and a box cutting osteotome was used in the femoral canal.  Broaching was then initiated using the Actis broaching system from a size 0 broach going up to a size 4 broach.  The York Cerise is just slightly probably had a lower neck cut.  I was pleased with the placement of it though.  We placed a high offset trial femoral neck and a 36+1.5 trial head ball.  This reduced into the pelvis.  Stable mood felt like we needed just a touch more leg length.  We dislocated the hip and removed the trial components.  We placed the real Actis femoral component size 4 with high offset and the real 36+5 ceramic hip ball.  Again this was reduced in the pelvis and we are pleased with leg length, offset, range of motion and stability assessed with mechanically and radiographically.  The soft tissue was then irrigated with normal saline solution.  Remnants of the joint capsule were closed with interrupted #1 Ethibond suture followed by #1 Vicryl for the tensor fascia.  0 Vicryl was used to close the deep tissue and 2-0 Vicryl was used to close subcutaneous tissue.  Skin was closed with staples.  An Aquacel dressing was applied.  The patient was taken off of the Hana table and taken to the recovery room in stable condition.  Rexene Edison, PA-C did assist during entire case and beginning to end and his assistance was medically necessary and crucial for soft tissue management and retraction, helping guide implant placement and a layered closure of the wound.

## 2023-04-15 NOTE — Anesthesia Procedure Notes (Signed)
Spinal  Patient location during procedure: OR Start time: 04/15/2023 7:37 AM End time: 04/15/2023 7:40 AM Reason for block: surgical anesthesia Staffing Performed: anesthesiologist  Anesthesiologist: Achille Rich, MD Performed by: Achille Rich, MD Authorized by: Achille Rich, MD   Preanesthetic Checklist Completed: patient identified, IV checked, risks and benefits discussed, surgical consent, monitors and equipment checked, pre-op evaluation and timeout performed Spinal Block Patient position: sitting Prep: DuraPrep Patient monitoring: cardiac monitor, continuous pulse ox and blood pressure Approach: midline Location: L3-4 Injection technique: single-shot Needle Needle type: Pencan  Needle gauge: 24 G Needle length: 9 cm Assessment Sensory level: T10 Events: CSF return Additional Notes Functioning IV was confirmed and monitors were applied. Sterile prep and drape, including hand hygiene and sterile gloves were used. The patient was positioned and the spine was prepped. The skin was anesthetized with lidocaine.  Free flow of clear CSF was obtained prior to injecting local anesthetic into the CSF.  The spinal needle aspirated freely following injection.  The needle was carefully withdrawn.  The patient tolerated the procedure well.

## 2023-04-15 NOTE — Transfer of Care (Signed)
Immediate Anesthesia Transfer of Care Note  Patient: Aaron Ruiz  Procedure(s) Performed: LEFT TOTAL HIP ARTHROPLASTY ANTERIOR APPROACH (Left: Hip)  Patient Location: PACU  Anesthesia Type:MAC and Spinal  Level of Consciousness: awake and patient cooperative  Airway & Oxygen Therapy: Patient Spontanous Breathing and Patient connected to face mask oxygen  Post-op Assessment: Report given to RN and Post -op Vital signs reviewed and stable  Post vital signs: Reviewed and stable  Last Vitals:  Vitals Value Taken Time  BP 100/53 04/15/23 0915  Temp 36.4 C 04/15/23 0915  Pulse 45 04/15/23 0926  Resp 13 04/15/23 0926  SpO2 99 % 04/15/23 0926  Vitals shown include unfiled device data.  Last Pain:  Vitals:   04/15/23 0915  TempSrc:   PainSc: Asleep         Complications: No notable events documented.

## 2023-04-16 ENCOUNTER — Encounter (HOSPITAL_COMMUNITY): Payer: Self-pay | Admitting: Orthopaedic Surgery

## 2023-04-16 DIAGNOSIS — I10 Essential (primary) hypertension: Secondary | ICD-10-CM | POA: Diagnosis not present

## 2023-04-16 DIAGNOSIS — Z87891 Personal history of nicotine dependence: Secondary | ICD-10-CM | POA: Diagnosis not present

## 2023-04-16 DIAGNOSIS — I480 Paroxysmal atrial fibrillation: Secondary | ICD-10-CM | POA: Diagnosis not present

## 2023-04-16 DIAGNOSIS — J45909 Unspecified asthma, uncomplicated: Secondary | ICD-10-CM | POA: Diagnosis not present

## 2023-04-16 DIAGNOSIS — Z79899 Other long term (current) drug therapy: Secondary | ICD-10-CM | POA: Diagnosis not present

## 2023-04-16 DIAGNOSIS — Z7901 Long term (current) use of anticoagulants: Secondary | ICD-10-CM | POA: Diagnosis not present

## 2023-04-16 DIAGNOSIS — Z85828 Personal history of other malignant neoplasm of skin: Secondary | ICD-10-CM | POA: Diagnosis not present

## 2023-04-16 DIAGNOSIS — M1612 Unilateral primary osteoarthritis, left hip: Secondary | ICD-10-CM | POA: Diagnosis not present

## 2023-04-16 LAB — BASIC METABOLIC PANEL
Anion gap: 10 (ref 5–15)
BUN: 12 mg/dL (ref 8–23)
CO2: 23 mmol/L (ref 22–32)
Calcium: 8.7 mg/dL — ABNORMAL LOW (ref 8.9–10.3)
Chloride: 104 mmol/L (ref 98–111)
Creatinine, Ser: 0.85 mg/dL (ref 0.61–1.24)
GFR, Estimated: >60 mL/min (ref >60)
Glucose, Bld: 142 mg/dL — ABNORMAL HIGH (ref 70–99)
Potassium: 4 mmol/L (ref 3.5–5.1)
Sodium: 137 mmol/L (ref 135–145)

## 2023-04-16 LAB — CBC
HCT: 34.9 % — ABNORMAL LOW (ref 39.0–52.0)
Hemoglobin: 11.6 g/dL — ABNORMAL LOW (ref 13.0–17.0)
MCH: 29.2 pg (ref 26.0–34.0)
MCHC: 33.2 g/dL (ref 30.0–36.0)
MCV: 87.9 fL (ref 80.0–100.0)
Platelets: 121 10*3/uL — ABNORMAL LOW (ref 150–400)
RBC: 3.97 MIL/uL — ABNORMAL LOW (ref 4.22–5.81)
RDW: 13.4 % (ref 11.5–15.5)
WBC: 8.1 10*3/uL (ref 4.0–10.5)
nRBC: 0 % (ref 0.0–0.2)

## 2023-04-16 MED ORDER — METHOCARBAMOL 500 MG PO TABS: 500.0000 mg | ORAL_TABLET | Freq: Four times a day (QID) | ORAL | 1 refills | Status: DC | PRN

## 2023-04-16 MED ORDER — OXYCODONE HCL 5 MG PO TABS: 5.0000 mg | ORAL_TABLET | Freq: Four times a day (QID) | ORAL | 0 refills | Status: DC | PRN

## 2023-04-16 NOTE — Anesthesia Postprocedure Evaluation (Signed)
Anesthesia Post Note  Patient: Aaron Ruiz  Procedure(s) Performed: LEFT TOTAL HIP ARTHROPLASTY ANTERIOR APPROACH (Left: Hip)     Patient location during evaluation: PACU Anesthesia Type: MAC and Spinal Level of consciousness: oriented and awake and alert Pain management: pain level controlled Vital Signs Assessment: post-procedure vital signs reviewed and stable Respiratory status: spontaneous breathing, respiratory function stable and patient connected to nasal cannula oxygen Cardiovascular status: blood pressure returned to baseline and stable Postop Assessment: no headache, no backache and no apparent nausea or vomiting Anesthetic complications: no   No notable events documented.  Last Vitals:  Vitals:   04/16/23 0451 04/16/23 0741  BP: 139/64 (!) 145/62  Pulse: 70 64  Resp: 18 16  Temp:  36.9 C  SpO2: 92% 95%    Last Pain:  Vitals:   04/16/23 0845  TempSrc:   PainSc: 2                  Khair Chasteen S

## 2023-04-16 NOTE — Progress Notes (Signed)
Physical Therapy Treatment Patient Details Name: Aaron Ruiz MRN: [id number] DOB: 1955/07/25 Today's Date: 04/16/2023   History of Present Illness 67 year old male presents to [facility] hospital on 04/15/2023 for elective L THA. PMH of hip OA, HTN, hyperlipidemia,and diverticulosis.    PT Comments  Pt tolerated treatment well today. Pt able to ambulate in hallway and navigate stairs with supervision. No change in DC/DME recs at this time. Pt mobility status adequate for DC home.   If plan is discharge home, recommend the following: A little help with walking and/or transfers;Help with stairs or ramp for entrance;Assistance with cooking/housework   Can travel by private Scientist, research (medical) walker (2 wheels)    Recommendations for Other Services       Precautions / Restrictions Precautions Precautions: Fall Precaution Comments: Direct anterior hip, no precautions Restrictions Weight Bearing Restrictions: Yes LLE Weight Bearing: Weight bearing as tolerated     Mobility  Bed Mobility Overal bed mobility: Needs Assistance Bed Mobility: Supine to Sit, Sit to Supine     Supine to sit: Supervision, Used rails, HOB elevated Sit to supine: Supervision, HOB elevated, Used rails   General bed mobility comments: Pt able to assist LLE using RLE. Reliant on bed rails.    Transfers Overall transfer level: Needs assistance Equipment used: Rolling walker (2 wheels) Transfers: Sit to/from Stand Sit to Stand: Supervision           General transfer comment: Cues for hand placement    Ambulation/Gait Ambulation/Gait assistance: Supervision Gait Distance (Feet): 350 Feet Assistive device: Rolling walker (2 wheels) Gait Pattern/deviations: Step-to pattern   Gait velocity interpretation: 1.31 - 2.62 ft/sec, indicative of limited community ambulator   General Gait Details: Slow, step to gait   Stairs Stairs: Yes Stairs assistance:  Supervision Stair Management: Two rails, Step to pattern, Forwards Number of Stairs: 4 General stair comments: no LOB noted. Cues for sequencing   Wheelchair Mobility     Tilt Bed    Modified Rankin (Stroke Patients Only)       Balance Overall balance assessment: Needs assistance Sitting-balance support: Feet supported, No upper extremity supported Sitting balance-Leahy Scale: Good     Standing balance support: Bilateral upper extremity supported, Reliant on assistive device for balance Standing balance-Leahy Scale: Poor                              Cognition Arousal: Alert Behavior During Therapy: WFL for tasks assessed/performed Overall Cognitive Status: Within Functional Limits for tasks assessed                                          Exercises      General Comments General comments (skin integrity, edema, etc.): VSS      Pertinent Vitals/Pain Pain Assessment Pain Assessment: No/denies pain    Home Living                          Prior Function            PT Goals (current goals can now be found in the care plan section) Progress towards PT goals: Progressing toward goals    Frequency    Min 1X/week      PT Plan  Co-evaluation              AM-PAC PT "6 Clicks" Mobility   Outcome Measure  Help needed turning from your back to your side while in a flat bed without using bedrails?: A Little Help needed moving from lying on your back to sitting on the side of a flat bed without using bedrails?: A Little Help needed moving to and from a bed to a chair (including a wheelchair)?: A Little Help needed standing up from a chair using your arms (e.g., wheelchair or bedside chair)?: A Little Help needed to walk in hospital room?: A Little Help needed climbing 3-5 steps with a railing? : A Little 6 Click Score: 18    End of Session Equipment Utilized During Treatment: Gait belt Activity Tolerance:  Patient tolerated treatment well Patient left: in bed;with call bell/phone within reach Nurse Communication: Mobility status PT Visit Diagnosis: Muscle weakness (generalized) (M62.81);Other abnormalities of gait and mobility (R26.89)     Time: 9528-4132 PT Time Calculation (min) (ACUTE ONLY): 18 min  Charges:    $Gait Training: 8-22 mins PT General Charges $$ ACUTE PT VISIT: 1 Visit                     Shela Nevin, PT, DPT Acute Rehab Services [phone]    Aaron Ruiz 04/16/2023, 9:34 AM

## 2023-04-16 NOTE — TOC Transition Note (Addendum)
Transition of Care [facility]) - CM/SW Discharge Note   Patient Details  Name: Aaron Ruiz MRN: [id number] Date of Birth: 12-02-1955  Transition of [facility]) CM/SW Contact:  Epifanio Lesches, RN Phone Number: 04/16/2023, 1:22 PM   Clinical Narrative:    Patient will DC to: home Anticipated DC date: 04/16/2023 Family notified:yes Transport by: car  Status post total replacement of left hip        -  Per MD patient ready for DC today . RN, patient, patient's wife and [facility] notified of DC. Referral made with [facility] for RW. Declined 3 in1.  Equipment will be delivered to bedside prior to d/c.Wife to assist with care once d/c. Pt without RX med concern. Post hospital f/u noted on AVS. Wife to provide transportation to home.   RNCM will sign off for now as intervention is no longer needed. Please consult [place] again if new needs arise.   Final next level of care: Home w Home Health Services Barriers to Discharge: No Barriers Identified   Patient Goals and CMS Choice   Choice offered to / list presented to : Patient  Discharge Placement                         Discharge Plan and Services Additional resources added to the After Visit Summary for     Discharge Planning Services: CM Consult            DME Arranged: Dan Humphreys rolling   Date DME Agency Contacted: 04/16/23 Time DME Agency Contacted: [phone] Representative spoke with at DME Agency: Vaughan Basta HH Arranged: PT HH Agency: [facility] Date [facility] Agency Contacted: 04/16/23 Time HH Agency Contacted: [phone] Representative spoke with at [facility] Agency: Lynnette  Social Determinants of Health (SDOH) Interventions SDOH Screenings   Food Insecurity: No Food Insecurity (04/15/2023)  Housing: Low Risk  (04/15/2023)  Transportation Needs: No Transportation Needs (04/15/2023)  Utilities: Not At Risk (04/15/2023)  Alcohol Screen: Low Risk  (05/18/2021)  Depression (PHQ2-9): Low Risk  (06/18/2022)  Financial  Resource Strain: Low Risk  (11/22/2022)  Physical Activity: Sufficiently Active (11/22/2022)  Social Connections: Socially Integrated (11/22/2022)  Stress: No Stress Concern Present (11/22/2022)  Tobacco Use: Medium Risk (04/15/2023)     Readmission Risk Interventions     No data to display

## 2023-04-16 NOTE — Progress Notes (Signed)
Subjective: 1 Day Post-Op Procedure(s) (LRB): LEFT TOTAL HIP ARTHROPLASTY ANTERIOR APPROACH (Left) Patient reports pain as moderate.    Objective: Vital signs in last 24 hours: Temp:  [97.5 F (36.4 C)-97.8 F (36.6 C)] 97.6 F (36.4 C) (10/15 2200) Pulse Rate:  [43-70] 70 (10/16 0451) Resp:  [10-18] 18 (10/16 0451) BP: (100-139)/(47-81) 139/64 (10/16 0451) SpO2:  [92 %-99 %] 92 % (10/16 0451)  Intake/Output from previous day: 10/15 0701 - 10/16 0700 In: 940 [P.O.:240; I.V.:500; IV Piggyback:200] Out: 2975 [Urine:2825; Blood:150] Intake/Output this shift: No intake/output data recorded.  Recent Labs    04/16/23 0649  HGB 11.6*   Recent Labs    04/16/23 0649  WBC 8.1  RBC 3.97*  HCT 34.9*  PLT 121*   No results for input(s): "NA", "K", "CL", "CO2", "BUN", "CREATININE", "GLUCOSE", "CALCIUM" in the last 72 hours. No results for input(s): "LABPT", "INR" in the last 72 hours.  Sensation intact distally Intact pulses distally Dorsiflexion/Plantar flexion intact Incision: scant drainage   Assessment/Plan: 1 Day Post-Op Procedure(s) (LRB): LEFT TOTAL HIP ARTHROPLASTY ANTERIOR APPROACH (Left) Up with therapy Discharge home with home health after PT today      Kathryne Hitch 04/16/2023, 7:28 AM

## 2023-04-16 NOTE — Discharge Instructions (Signed)

## 2023-04-16 NOTE — Plan of Care (Signed)
Problem: Education: Goal: Knowledge of General Education information will improve Description: Including pain rating scale, medication(s)/side effects and non-pharmacologic comfort measures Outcome: Completed/Met   Problem: Health Behavior/Discharge Planning: Goal: Ability to manage health-related needs will improve Outcome: Completed/Met   Problem: Clinical Measurements: Goal: Ability to maintain clinical measurements within normal limits will improve Outcome: Completed/Met Goal: Will remain free from infection Outcome: Completed/Met Goal: Diagnostic test results will improve Outcome: Completed/Met Goal: Respiratory complications will improve Outcome: Completed/Met Goal: Cardiovascular complication will be avoided Outcome: Completed/Met   Problem: Activity: Goal: Risk for activity intolerance will decrease Outcome: Completed/Met   Problem: Nutrition: Goal: Adequate nutrition will be maintained Outcome: Completed/Met   Problem: Coping: Goal: Level of anxiety will decrease Outcome: Completed/Met   Problem: Elimination: Goal: Will not experience complications related to bowel motility Outcome: Completed/Met Goal: Will not experience complications related to urinary retention Outcome: Completed/Met   Problem: Pain Managment: Goal: General experience of comfort will improve Outcome: Completed/Met   Problem: Safety: Goal: Ability to remain free from injury will improve Outcome: Completed/Met   Problem: Skin Integrity: Goal: Risk for impaired skin integrity will decrease Outcome: Completed/Met   Problem: Education: Goal: Knowledge of the prescribed therapeutic regimen will improve Outcome: Completed/Met Goal: Understanding of discharge needs will improve Outcome: Completed/Met Goal: Individualized Educational Video(s) Outcome: Completed/Met   Problem: Activity: Goal: Ability to avoid complications of mobility impairment will improve Outcome:  Completed/Met Goal: Ability to tolerate increased activity will improve Outcome: Completed/Met   Problem: Clinical Measurements: Goal: Postoperative complications will be avoided or minimized Outcome: Completed/Met   Problem: Pain Management: Goal: Pain level will decrease with appropriate interventions Outcome: Completed/Met   Problem: Skin Integrity: Goal: Will show signs of wound healing Outcome: Completed/Met

## 2023-04-16 NOTE — Discharge Summary (Signed)
Patient ID: Aaron Ruiz MRN: [id number] DOB/AGE: [birth year] 68 y.o.  Admit date: 04/15/2023 Discharge date: 04/16/2023  Admission Diagnoses:  Principal Problem:   Unilateral primary osteoarthritis, left hip Active Problems:   OA (osteoarthritis) of hip   Status post total replacement of left hip   Discharge Diagnoses:  Same  Past Medical History:  Diagnosis Date   Actinic keratosis 03/20/2009   Qualifier: Diagnosis of  By: Artist Pais DO, D. Robert    Allergic conjunctivitis 06/30/2018   Anemia    ANEMIA-NOS 02/06/2009   Qualifier: Diagnosis of  By: Terrilee Croak CMA, [name]     Asthma    uses inhaler   Basal cell carcinoma 2017   left side of nose   Basal cell carcinoma of nose 10/16/2020   Borderline diabetes 05/03/2011   BPH (benign prostatic hyperplasia) 07/29/2014   Chemosis of conjunctiva of both eyes 01/15/2017   Colon polyps    Daytime somnolence 09/21/2020   Diverticulosis    Dysrhythmia    Essential hypertension 02/06/2009   Qualifier: Diagnosis of  By: Terrilee Croak CMA, [name]      Fatigue 09/21/2020   Former smoker 09/21/2020   General medical examination 11/14/2010   GERD 02/06/2009   Qualifier: Diagnosis of  By: Terrilee Croak CMA, [name]     GERD (gastroesophageal reflux disease)    on meds   High cholesterol 02/06/2009   Qualifier: Diagnosis of  By: Terrilee Croak CMA, [name]     Hx of adenomatous colonic polyps 03/07/2009   Hx of eye surgery 2018   "Left Eye was swollen and had procedure to remove excess swelling"   Hyperglycemia 05/01/2011   Hyperlipidemia    Hypertension    Hypogonadism male 04/30/2011   Lactose intolerance    Lentigo 10/16/2020   Mild intermittent asthma 06/30/2018   Neoplasm of uncertain behavior of skin 10/16/2020   Obesity (BMI 30-39.9) 09/21/2020   OSA (obstructive sleep apnea) 11/22/2022   Osteoarthritis 09/24/2011   OVERWEIGHT 02/06/2009   Qualifier: Diagnosis of  By: Nena Jordan    Palpitations 09/21/2020   Paroxysmal atrial  fibrillation (HCC) 10/06/2020   Rash/urticaria 08/07/2012   Recurrent UTI 07/30/2013   Routine general medical examination at a health care facility 12/16/2011   Secondary hypercoagulable state (HCC) 10/06/2020   SKIN LESION 03/20/2009   Qualifier: Diagnosis of  By: Nena Jordan    Snoring 09/21/2020   Unspecified asthma(493.90) 02/06/2009   Centricity Description: ASTHMA Qualifier: Diagnosis of  By: Terrilee Croak CMA, [name]   Centricity Description: ASTHMA, INTERMITTENT, MILD Qualifier: Diagnosis of  By: Peggyann Juba FNP, [name] S    URI (upper respiratory infection) 07/23/2011   Vitamin D deficiency 09/21/2020    Surgeries: Procedure(s): LEFT TOTAL HIP ARTHROPLASTY ANTERIOR APPROACH on 04/15/2023   Consultants:   Discharged Condition: Improved  Hospital Course: Aaron Ruiz is an 67 y.o. male who was admitted 04/15/2023 for operative treatment ofUnilateral primary osteoarthritis, left hip. Patient has severe unremitting pain that affects sleep, daily activities, and work/hobbies. After pre-op clearance the patient was taken to the operating room on 04/15/2023 and underwent  Procedure(s): LEFT TOTAL HIP ARTHROPLASTY ANTERIOR APPROACH.    Patient was given perioperative antibiotics:  Anti-infectives (From admission, onward)    Start     Dose/Rate Route Frequency Ordered Stop   04/15/23 1200  ceFAZolin (ANCEF) IVPB 1 g/50 mL premix        1 g 100 mL/hr over 30 Minutes Intravenous Every 6 hours 04/15/23 1109 04/15/23 1824   04/15/23  0601  ceFAZolin (ANCEF) 2-4 GM/100ML-% IVPB       Note to Pharmacy: Kandice Hams D: cabinet override      04/15/23 0601 04/15/23 0807   04/15/23 0600  ceFAZolin (ANCEF) IVPB 2g/100 mL premix        2 g 200 mL/hr over 30 Minutes Intravenous On call to O.R. 04/15/23 0555 04/15/23 0800        Patient was given sequential compression devices, early ambulation, and chemoprophylaxis to prevent DVT.  Patient benefited maximally from hospital stay  and there were no complications.    Recent vital signs: Patient Vitals for the past 24 hrs:  BP Temp Temp src Pulse Resp SpO2  04/16/23 0741 (!) 145/62 98.4 F (36.9 C) Oral 64 16 95 %  04/16/23 0451 139/64 -- -- 70 18 92 %  04/15/23 2200 118/66 97.6 F (36.4 C) Oral 60 16 97 %  04/15/23 1416 120/64 (!) 97.5 F (36.4 C) Oral -- 15 96 %     Recent laboratory studies:  Recent Labs    04/16/23 0649  WBC 8.1  HGB 11.6*  HCT 34.9*  PLT 121*  NA 137  K 4.0  CL 104  CO2 23  BUN 12  CREATININE 0.85  GLUCOSE 142*  CALCIUM 8.7*     Discharge Medications:   Allergies as of 04/16/2023       Reactions   Sulfonamide Derivatives    Hallucination (Childhood Reaction)        Medication List     TAKE these medications    acetaminophen 500 MG tablet Commonly known as: TYLENOL Take 1 tablet (500 mg total) by mouth every 6 (six) hours as needed.   albuterol 108 (90 Base) MCG/ACT inhaler Commonly known as: VENTOLIN HFA Inhale 2 puffs into the lungs every 6 (six) hours as needed for wheezing or shortness of breath.   amiodarone 200 MG tablet Commonly known as: PACERONE Take 1 tablet (200 mg total) by mouth daily.   amLODipine 10 MG tablet Commonly known as: NORVASC Take 1 tablet (10 mg total) by mouth daily.   apixaban 5 MG Tabs tablet Commonly known as: Eliquis TAKE 1 TABLET(5 MG) BY MOUTH TWICE DAILY   cetirizine 10 MG tablet Commonly known as: ZYRTEC Take 10 mg by mouth daily.   diphenhydrAMINE 25 MG tablet Commonly known as: BENADRYL Take 25 mg by mouth at bedtime.   Fish Oil 1200 MG Caps Take 1,200 mg by mouth daily.   fluticasone 50 MCG/ACT nasal spray Commonly known as: FLONASE Place 2 sprays into both nostrils daily as needed for allergies or rhinitis.   GLUCOSAMINE-CHONDROITIN PO Take 1 tablet by mouth daily.   guaiFENesin 600 MG 12 hr tablet Commonly known as: MUCINEX Take 600 mg by mouth as needed for to loosen phlegm or cough.    losartan-hydrochlorothiazide 100-25 MG tablet Commonly known as: HYZAAR Take 1 tablet by mouth daily.   methocarbamol 500 MG tablet Commonly known as: ROBAXIN Take 1 tablet (500 mg total) by mouth every 6 (six) hours as needed for muscle spasms.   metoprolol succinate 25 MG 24 hr tablet Commonly known as: TOPROL-XL 1 daily   naphazoline-glycerin 0.012-0.25 % Soln Commonly known as: CLEAR EYES REDNESS Place 1-2 drops into both eyes 4 (four) times daily as needed for eye irritation.   NON FORMULARY Pt uses c-pap nightly   omeprazole 40 MG capsule Commonly known as: PRILOSEC TAKE 1 CAPSULE(40 MG) BY MOUTH DAILY   oxyCODONE 5 MG immediate  release tablet Commonly known as: Oxy IR/ROXICODONE Take 1-2 tablets (5-10 mg total) by mouth every 6 (six) hours as needed for moderate pain (pain score 4-6) (pain score 4-6).   pravastatin 40 MG tablet Commonly known as: PRAVACHOL TAKE 1 TABLET(40 MG) BY MOUTH DAILY   sildenafil 20 MG tablet Commonly known as: REVATIO 1-2 tabs by mouth prior to sexual activity   tamsulosin 0.4 MG Caps capsule Commonly known as: FLOMAX Take 1 capsule (0.4 mg total) by mouth daily.   Turmeric Curcumin 500 MG Caps Take 500 mg by mouth daily.   Vitamin D 50 MCG (2000 UT) tablet Take 2,000 Units by mouth daily.               Durable Medical Equipment  (From admission, onward)           Start     Ordered   04/15/23 1110  DME 3 n 1  Once        04/15/23 1109   04/15/23 1110  DME Walker rolling  Once       Question Answer Comment  Walker: With 5 Inch Wheels   Patient needs a walker to treat with the following condition Status post left hip replacement      04/15/23 1109            Diagnostic Studies: DG Pelvis Portable  Result Date: 04/15/2023 CLINICAL DATA:  Status post total left hip arthroplasty. EXAM: PORTABLE PELVIS 1-2 VIEWS COMPARISON:  None Available. FINDINGS: Status post total left hip arthroplasty. No perihardware  lucency is seen to indicate hardware failure or loosening. Expected lateral greater medial left hip and thigh subcutaneous air postoperative change. Lateral left hip surgical skin staples. Mild superior right femoroacetabular joint space narrowing and superolateral osteophytosis. Mild superior pubic symphysis joint space narrowing. Mild bilateral sacroiliac subchondral sclerosis. No acute fracture or dislocation. IMPRESSION: Status post total left hip arthroplasty without evidence of hardware failure or loosening. Electronically Signed   By: Neita Garnet M.D.   On: 04/15/2023 10:37   DG HIP UNILAT WITH PELVIS 1V LEFT  Result Date: 04/15/2023 CLINICAL DATA:  Status post total left hip arthroplasty. Intraoperative fluoroscopy. EXAM: DG HIP (WITH OR WITHOUT PELVIS) 1V*L* COMPARISON:  None Available. FINDINGS: Images were performed intraoperatively without the presence of a radiologist. Total left hip arthroplasty. No hardware complication is seen. Total fluoroscopy images: 3 Total fluoroscopy time: 19 seconds Total dose: Radiation Exposure Index (as provided by the fluoroscopic device): 2.74 mGy air Kerma Please see intraoperative findings for further detail. IMPRESSION: Intraoperative fluoroscopy for total left hip arthroplasty. Electronically Signed   By: Neita Garnet M.D.   On: 04/15/2023 10:36   DG C-Arm 1-60 Min-No Report  Result Date: 04/15/2023 Fluoroscopy was utilized by the requesting physician.  No radiographic interpretation.   DG C-Arm 1-60 Min-No Report  Result Date: 04/15/2023 Fluoroscopy was utilized by the requesting physician.  No radiographic interpretation.    Disposition: Discharge disposition: 01-Home or Self Care          Follow-up Information     Kathryne Hitch, MD Follow up in 2 week(s).   Specialty: Orthopedic Surgery Contact information: [address] [place] [address] [phone]         Sandford Craze, NP Follow up.   Specialty:  Internal Medicine Contact information: [address] [place] [address] [phone]         [facility] Follow up.   Specialty: Home  Health Services Why: home health services will be provided by [facility] Contact information: [address] [place] [address] [phone]                  Signed: Kathryne Hitch 04/16/2023, 11:42 AM

## 2023-04-17 DIAGNOSIS — R7303 Prediabetes: Secondary | ICD-10-CM | POA: Diagnosis not present

## 2023-04-17 DIAGNOSIS — K219 Gastro-esophageal reflux disease without esophagitis: Secondary | ICD-10-CM | POA: Diagnosis not present

## 2023-04-17 DIAGNOSIS — K5730 Diverticulosis of large intestine without perforation or abscess without bleeding: Secondary | ICD-10-CM | POA: Diagnosis not present

## 2023-04-17 DIAGNOSIS — I499 Cardiac arrhythmia, unspecified: Secondary | ICD-10-CM | POA: Diagnosis not present

## 2023-04-17 DIAGNOSIS — M1990 Unspecified osteoarthritis, unspecified site: Secondary | ICD-10-CM | POA: Diagnosis not present

## 2023-04-17 DIAGNOSIS — Z860100 Personal history of colon polyps, unspecified: Secondary | ICD-10-CM | POA: Diagnosis not present

## 2023-04-17 DIAGNOSIS — Z96642 Presence of left artificial hip joint: Secondary | ICD-10-CM | POA: Diagnosis not present

## 2023-04-17 DIAGNOSIS — Z85828 Personal history of other malignant neoplasm of skin: Secondary | ICD-10-CM | POA: Diagnosis not present

## 2023-04-17 DIAGNOSIS — D649 Anemia, unspecified: Secondary | ICD-10-CM | POA: Diagnosis not present

## 2023-04-17 DIAGNOSIS — I4892 Unspecified atrial flutter: Secondary | ICD-10-CM | POA: Diagnosis not present

## 2023-04-17 DIAGNOSIS — G4733 Obstructive sleep apnea (adult) (pediatric): Secondary | ICD-10-CM | POA: Diagnosis not present

## 2023-04-17 DIAGNOSIS — Z87891 Personal history of nicotine dependence: Secondary | ICD-10-CM | POA: Diagnosis not present

## 2023-04-17 DIAGNOSIS — Z471 Aftercare following joint replacement surgery: Secondary | ICD-10-CM | POA: Diagnosis not present

## 2023-04-17 DIAGNOSIS — I10 Essential (primary) hypertension: Secondary | ICD-10-CM | POA: Diagnosis not present

## 2023-04-17 DIAGNOSIS — N400 Enlarged prostate without lower urinary tract symptoms: Secondary | ICD-10-CM | POA: Diagnosis not present

## 2023-04-17 DIAGNOSIS — J309 Allergic rhinitis, unspecified: Secondary | ICD-10-CM | POA: Diagnosis not present

## 2023-04-17 DIAGNOSIS — Z6832 Body mass index (BMI) 32.0-32.9, adult: Secondary | ICD-10-CM | POA: Diagnosis not present

## 2023-04-17 DIAGNOSIS — Z87440 Personal history of urinary (tract) infections: Secondary | ICD-10-CM | POA: Diagnosis not present

## 2023-04-17 DIAGNOSIS — E7800 Pure hypercholesterolemia, unspecified: Secondary | ICD-10-CM | POA: Diagnosis not present

## 2023-04-17 DIAGNOSIS — E669 Obesity, unspecified: Secondary | ICD-10-CM | POA: Diagnosis not present

## 2023-04-17 DIAGNOSIS — E559 Vitamin D deficiency, unspecified: Secondary | ICD-10-CM | POA: Diagnosis not present

## 2023-04-17 DIAGNOSIS — Z7901 Long term (current) use of anticoagulants: Secondary | ICD-10-CM | POA: Diagnosis not present

## 2023-04-17 DIAGNOSIS — J4520 Mild intermittent asthma, uncomplicated: Secondary | ICD-10-CM | POA: Diagnosis not present

## 2023-04-17 DIAGNOSIS — I480 Paroxysmal atrial fibrillation: Secondary | ICD-10-CM | POA: Diagnosis not present

## 2023-04-17 DIAGNOSIS — Z9181 History of falling: Secondary | ICD-10-CM | POA: Diagnosis not present

## 2023-04-19 DIAGNOSIS — E669 Obesity, unspecified: Secondary | ICD-10-CM | POA: Diagnosis not present

## 2023-04-19 DIAGNOSIS — D649 Anemia, unspecified: Secondary | ICD-10-CM | POA: Diagnosis not present

## 2023-04-19 DIAGNOSIS — R7303 Prediabetes: Secondary | ICD-10-CM | POA: Diagnosis not present

## 2023-04-19 DIAGNOSIS — J309 Allergic rhinitis, unspecified: Secondary | ICD-10-CM | POA: Diagnosis not present

## 2023-04-19 DIAGNOSIS — E559 Vitamin D deficiency, unspecified: Secondary | ICD-10-CM | POA: Diagnosis not present

## 2023-04-19 DIAGNOSIS — E7800 Pure hypercholesterolemia, unspecified: Secondary | ICD-10-CM | POA: Diagnosis not present

## 2023-04-19 DIAGNOSIS — Z471 Aftercare following joint replacement surgery: Secondary | ICD-10-CM | POA: Diagnosis not present

## 2023-04-19 DIAGNOSIS — I480 Paroxysmal atrial fibrillation: Secondary | ICD-10-CM | POA: Diagnosis not present

## 2023-04-19 DIAGNOSIS — J4520 Mild intermittent asthma, uncomplicated: Secondary | ICD-10-CM | POA: Diagnosis not present

## 2023-04-19 DIAGNOSIS — M1990 Unspecified osteoarthritis, unspecified site: Secondary | ICD-10-CM | POA: Diagnosis not present

## 2023-04-19 DIAGNOSIS — N400 Enlarged prostate without lower urinary tract symptoms: Secondary | ICD-10-CM | POA: Diagnosis not present

## 2023-04-19 DIAGNOSIS — I4892 Unspecified atrial flutter: Secondary | ICD-10-CM | POA: Diagnosis not present

## 2023-04-19 DIAGNOSIS — G4733 Obstructive sleep apnea (adult) (pediatric): Secondary | ICD-10-CM | POA: Diagnosis not present

## 2023-04-19 DIAGNOSIS — I499 Cardiac arrhythmia, unspecified: Secondary | ICD-10-CM | POA: Diagnosis not present

## 2023-04-19 DIAGNOSIS — I10 Essential (primary) hypertension: Secondary | ICD-10-CM | POA: Diagnosis not present

## 2023-04-19 DIAGNOSIS — K5730 Diverticulosis of large intestine without perforation or abscess without bleeding: Secondary | ICD-10-CM | POA: Diagnosis not present

## 2023-04-21 DIAGNOSIS — E559 Vitamin D deficiency, unspecified: Secondary | ICD-10-CM | POA: Diagnosis not present

## 2023-04-21 DIAGNOSIS — J4520 Mild intermittent asthma, uncomplicated: Secondary | ICD-10-CM | POA: Diagnosis not present

## 2023-04-21 DIAGNOSIS — E7800 Pure hypercholesterolemia, unspecified: Secondary | ICD-10-CM | POA: Diagnosis not present

## 2023-04-21 DIAGNOSIS — I4892 Unspecified atrial flutter: Secondary | ICD-10-CM | POA: Diagnosis not present

## 2023-04-21 DIAGNOSIS — G4733 Obstructive sleep apnea (adult) (pediatric): Secondary | ICD-10-CM | POA: Diagnosis not present

## 2023-04-21 DIAGNOSIS — I10 Essential (primary) hypertension: Secondary | ICD-10-CM | POA: Diagnosis not present

## 2023-04-21 DIAGNOSIS — R7303 Prediabetes: Secondary | ICD-10-CM | POA: Diagnosis not present

## 2023-04-21 DIAGNOSIS — I480 Paroxysmal atrial fibrillation: Secondary | ICD-10-CM | POA: Diagnosis not present

## 2023-04-21 DIAGNOSIS — Z471 Aftercare following joint replacement surgery: Secondary | ICD-10-CM | POA: Diagnosis not present

## 2023-04-21 DIAGNOSIS — E669 Obesity, unspecified: Secondary | ICD-10-CM | POA: Diagnosis not present

## 2023-04-21 DIAGNOSIS — J309 Allergic rhinitis, unspecified: Secondary | ICD-10-CM | POA: Diagnosis not present

## 2023-04-21 DIAGNOSIS — N400 Enlarged prostate without lower urinary tract symptoms: Secondary | ICD-10-CM | POA: Diagnosis not present

## 2023-04-21 DIAGNOSIS — K5730 Diverticulosis of large intestine without perforation or abscess without bleeding: Secondary | ICD-10-CM | POA: Diagnosis not present

## 2023-04-21 DIAGNOSIS — M1990 Unspecified osteoarthritis, unspecified site: Secondary | ICD-10-CM | POA: Diagnosis not present

## 2023-04-21 DIAGNOSIS — I499 Cardiac arrhythmia, unspecified: Secondary | ICD-10-CM | POA: Diagnosis not present

## 2023-04-21 DIAGNOSIS — D649 Anemia, unspecified: Secondary | ICD-10-CM | POA: Diagnosis not present

## 2023-04-22 ENCOUNTER — Other Ambulatory Visit: Payer: Self-pay | Admitting: Cardiology

## 2023-04-22 ENCOUNTER — Encounter: Payer: Self-pay | Admitting: Family

## 2023-04-22 DIAGNOSIS — I480 Paroxysmal atrial fibrillation: Secondary | ICD-10-CM

## 2023-04-22 NOTE — Telephone Encounter (Signed)
Prescription refill request for Eliquis received. Indication: a fib Last office visit: 02/28/23 Scr: 0.85 04/16/23 epic Age: 67 Weight: 95kg

## 2023-04-24 DIAGNOSIS — J4520 Mild intermittent asthma, uncomplicated: Secondary | ICD-10-CM | POA: Diagnosis not present

## 2023-04-24 DIAGNOSIS — I10 Essential (primary) hypertension: Secondary | ICD-10-CM | POA: Diagnosis not present

## 2023-04-24 DIAGNOSIS — J309 Allergic rhinitis, unspecified: Secondary | ICD-10-CM | POA: Diagnosis not present

## 2023-04-24 DIAGNOSIS — N400 Enlarged prostate without lower urinary tract symptoms: Secondary | ICD-10-CM | POA: Diagnosis not present

## 2023-04-24 DIAGNOSIS — R7303 Prediabetes: Secondary | ICD-10-CM | POA: Diagnosis not present

## 2023-04-24 DIAGNOSIS — I499 Cardiac arrhythmia, unspecified: Secondary | ICD-10-CM | POA: Diagnosis not present

## 2023-04-24 DIAGNOSIS — G4733 Obstructive sleep apnea (adult) (pediatric): Secondary | ICD-10-CM | POA: Diagnosis not present

## 2023-04-24 DIAGNOSIS — E559 Vitamin D deficiency, unspecified: Secondary | ICD-10-CM | POA: Diagnosis not present

## 2023-04-24 DIAGNOSIS — K5730 Diverticulosis of large intestine without perforation or abscess without bleeding: Secondary | ICD-10-CM | POA: Diagnosis not present

## 2023-04-24 DIAGNOSIS — M1990 Unspecified osteoarthritis, unspecified site: Secondary | ICD-10-CM | POA: Diagnosis not present

## 2023-04-24 DIAGNOSIS — D649 Anemia, unspecified: Secondary | ICD-10-CM | POA: Diagnosis not present

## 2023-04-24 DIAGNOSIS — I4892 Unspecified atrial flutter: Secondary | ICD-10-CM | POA: Diagnosis not present

## 2023-04-24 DIAGNOSIS — I480 Paroxysmal atrial fibrillation: Secondary | ICD-10-CM | POA: Diagnosis not present

## 2023-04-24 DIAGNOSIS — E7800 Pure hypercholesterolemia, unspecified: Secondary | ICD-10-CM | POA: Diagnosis not present

## 2023-04-24 DIAGNOSIS — Z471 Aftercare following joint replacement surgery: Secondary | ICD-10-CM | POA: Diagnosis not present

## 2023-04-24 DIAGNOSIS — E669 Obesity, unspecified: Secondary | ICD-10-CM | POA: Diagnosis not present

## 2023-04-25 DIAGNOSIS — J309 Allergic rhinitis, unspecified: Secondary | ICD-10-CM | POA: Diagnosis not present

## 2023-04-25 DIAGNOSIS — J4520 Mild intermittent asthma, uncomplicated: Secondary | ICD-10-CM | POA: Diagnosis not present

## 2023-04-25 DIAGNOSIS — R7303 Prediabetes: Secondary | ICD-10-CM | POA: Diagnosis not present

## 2023-04-25 DIAGNOSIS — I4892 Unspecified atrial flutter: Secondary | ICD-10-CM | POA: Diagnosis not present

## 2023-04-25 DIAGNOSIS — I499 Cardiac arrhythmia, unspecified: Secondary | ICD-10-CM | POA: Diagnosis not present

## 2023-04-25 DIAGNOSIS — E669 Obesity, unspecified: Secondary | ICD-10-CM | POA: Diagnosis not present

## 2023-04-25 DIAGNOSIS — Z471 Aftercare following joint replacement surgery: Secondary | ICD-10-CM | POA: Diagnosis not present

## 2023-04-25 DIAGNOSIS — D649 Anemia, unspecified: Secondary | ICD-10-CM | POA: Diagnosis not present

## 2023-04-25 DIAGNOSIS — M1990 Unspecified osteoarthritis, unspecified site: Secondary | ICD-10-CM | POA: Diagnosis not present

## 2023-04-25 DIAGNOSIS — I10 Essential (primary) hypertension: Secondary | ICD-10-CM | POA: Diagnosis not present

## 2023-04-25 DIAGNOSIS — E559 Vitamin D deficiency, unspecified: Secondary | ICD-10-CM | POA: Diagnosis not present

## 2023-04-25 DIAGNOSIS — E7800 Pure hypercholesterolemia, unspecified: Secondary | ICD-10-CM | POA: Diagnosis not present

## 2023-04-25 DIAGNOSIS — G4733 Obstructive sleep apnea (adult) (pediatric): Secondary | ICD-10-CM | POA: Diagnosis not present

## 2023-04-25 DIAGNOSIS — K5730 Diverticulosis of large intestine without perforation or abscess without bleeding: Secondary | ICD-10-CM | POA: Diagnosis not present

## 2023-04-25 DIAGNOSIS — N400 Enlarged prostate without lower urinary tract symptoms: Secondary | ICD-10-CM | POA: Diagnosis not present

## 2023-04-25 DIAGNOSIS — I480 Paroxysmal atrial fibrillation: Secondary | ICD-10-CM | POA: Diagnosis not present

## 2023-04-28 ENCOUNTER — Ambulatory Visit (INDEPENDENT_AMBULATORY_CARE_PROVIDER_SITE_OTHER): Payer: Medicare Other | Admitting: Orthopaedic Surgery

## 2023-04-28 ENCOUNTER — Encounter: Payer: Self-pay | Admitting: Orthopaedic Surgery

## 2023-04-28 ENCOUNTER — Other Ambulatory Visit (HOSPITAL_COMMUNITY): Payer: Medicare Other

## 2023-04-28 DIAGNOSIS — Z96642 Presence of left artificial hip joint: Secondary | ICD-10-CM

## 2023-04-28 NOTE — Progress Notes (Signed)
The patient is here for his first postoperative visit status post a left hip replacement.  He is an active 67 year old gentleman.  He is on chronic blood thinning medication which she is still on.  He is walking without any assistive device.  He does have moderate pain.  His left hip incision looks great.  The staples have been removed and Steri-Strips applied.  He does have a hematoma but no significant seroma.  His leg lengths are equal.  He will continue to slowly increase his activities as comfort allows.  He can drive from my standpoint.  Will see him back in a month to see how he is doing overall but no x-rays are needed.

## 2023-05-05 ENCOUNTER — Encounter: Payer: Self-pay | Admitting: Orthopaedic Surgery

## 2023-05-07 DIAGNOSIS — G4733 Obstructive sleep apnea (adult) (pediatric): Secondary | ICD-10-CM | POA: Diagnosis not present

## 2023-05-15 ENCOUNTER — Ambulatory Visit: Payer: Medicare Other | Admitting: Cardiology

## 2023-05-26 ENCOUNTER — Encounter: Payer: Self-pay | Admitting: Orthopaedic Surgery

## 2023-05-26 ENCOUNTER — Ambulatory Visit (INDEPENDENT_AMBULATORY_CARE_PROVIDER_SITE_OTHER): Payer: Medicare Other | Admitting: Orthopaedic Surgery

## 2023-05-26 DIAGNOSIS — Z96642 Presence of left artificial hip joint: Secondary | ICD-10-CM

## 2023-05-26 NOTE — Progress Notes (Signed)
The patient is now 6 weeks status post a left total hip replacement.  He is an active 67 year old and reports good range of motion and strength and that he is getting there overall he states.  He is able to perform his ADLs easier.  I am able to easily put his left hip through range of motion with no issues at all.  His right hip also moves more smoothly.  His leg lengths are close to equal.  He had a significant difference before.  From my standpoint he will continue to increase his activities as comfort allows with really no restrictions other than running.  If there are issues before we see him back in 6 months he will let us know.  At his 50-month visit I would like a standing AP pelvis and a lateral of the left hip.

## 2023-06-01 ENCOUNTER — Other Ambulatory Visit: Payer: Self-pay | Admitting: Family

## 2023-06-01 DIAGNOSIS — N401 Enlarged prostate with lower urinary tract symptoms: Secondary | ICD-10-CM

## 2023-06-01 DIAGNOSIS — I10 Essential (primary) hypertension: Secondary | ICD-10-CM

## 2023-06-05 ENCOUNTER — Other Ambulatory Visit: Payer: Self-pay

## 2023-06-05 DIAGNOSIS — I4819 Other persistent atrial fibrillation: Secondary | ICD-10-CM

## 2023-06-06 ENCOUNTER — Other Ambulatory Visit: Payer: Self-pay | Admitting: Family

## 2023-06-06 DIAGNOSIS — I10 Essential (primary) hypertension: Secondary | ICD-10-CM

## 2023-06-10 ENCOUNTER — Ambulatory Visit (INDEPENDENT_AMBULATORY_CARE_PROVIDER_SITE_OTHER): Payer: Medicare Other

## 2023-06-10 VITALS — Ht 67.0 in | Wt 205.0 lb

## 2023-06-10 DIAGNOSIS — Z0000 Encounter for general adult medical examination without abnormal findings: Secondary | ICD-10-CM | POA: Diagnosis not present

## 2023-06-10 NOTE — Patient Instructions (Addendum)
Aaron Ruiz , Thank you for taking time to come for your Medicare Wellness Visit. I appreciate your ongoing commitment to your health goals. Please review the following plan we discussed and let me know if I can assist you in the future.   Referrals/Orders/Follow-Ups/Clinician Recommendations:   This is a list of the screening recommended for you and due dates:  Health Maintenance  Topic Date Due   Medicare Annual Wellness Visit  06/09/2024   Colon Cancer Screening  10/26/2024   DTaP/Tdap/Td vaccine (3 - Td or Tdap) 03/29/2029   Pneumonia Vaccine  Completed   Flu Shot  Completed   Hepatitis C Screening  Completed   Zoster (Shingles) Vaccine  Completed   HPV Vaccine  Aged Out   COVID-19 Vaccine  Discontinued    Advanced directives: (Copy Requested) Please bring a copy of your health care power of attorney and living will to the office to be added to your chart at your convenience.  Next Medicare Annual Wellness Visit scheduled for next year: Yes

## 2023-06-10 NOTE — Progress Notes (Signed)
Subjective:   Aaron Ruiz is a 67 y.o. male who presents for Medicare Annual/Subsequent preventive examination.  Visit Complete: Virtual I connected with  Aaron Ruiz on 06/10/23 by a audio enabled telemedicine application and verified that I am speaking with the correct person using two identifiers.  Patient Location: Home  Provider Location: Home Office  I discussed the limitations of evaluation and management by telemedicine. The patient expressed understanding and agreed to proceed.  Vital Signs: Because this visit was a virtual/telehealth visit, some criteria may be missing or patient reported. Any vitals not documented were not able to be obtained and vitals that have been documented are patient reported.  Patient Medicare AWV questionnaire was completed by the patient on 06/03/23; I have confirmed that all information answered by patient is correct and no changes since this date.  Cardiac Risk Factors include: advanced age (>42men, >62 women);male gender;hypertension     Objective:    Today's Vitals   06/10/23 0938  Weight: 205 lb (93 kg)  Height: 5\' 7"  (1.702 m)   Body mass index is 32.11 kg/m.     06/10/2023    9:43 AM 04/15/2023   12:00 PM 04/07/2023    2:07 PM 04/07/2023    1:19 PM 12/12/2022    9:26 AM 02/08/2022    9:20 AM 05/18/2021    9:52 AM  Advanced Directives  Does Patient Have a Medical Advance Directive? Yes No Yes Yes Yes Yes Yes  Type of Estate agent of [place];Living will  Healthcare Power of [place];Living will Healthcare Power of [place];Living will Healthcare Power of Attorney Living will Living will  Does patient want to make changes to medical advance directive?  No - Patient declined No - Patient declined No - Patient declined  No - Patient declined   Copy of Healthcare Power of Attorney in Chart? No - copy requested  No - copy requested No - copy requested   No - copy requested  Would patient like information  on creating a medical advance directive?  No - Patient declined No - Patient declined        Current Medications (verified) Outpatient Encounter Medications as of 06/10/2023  Medication Sig   acetaminophen (TYLENOL) 500 MG tablet Take 1 tablet (500 mg total) by mouth every 6 (six) hours as needed.   albuterol (VENTOLIN HFA) 108 (90 Base) MCG/ACT inhaler Inhale 2 puffs into the lungs every 6 (six) hours as needed for wheezing or shortness of breath.   amiodarone (PACERONE) 200 MG tablet Take 1 tablet (200 mg total) by mouth daily.   amLODipine (NORVASC) 10 MG tablet Take 1 tablet (10 mg total) by mouth daily.   apixaban (ELIQUIS) 5 MG TABS tablet TAKE 1 TABLET(5 MG) BY MOUTH TWICE DAILY   cetirizine (ZYRTEC) 10 MG tablet Take 10 mg by mouth daily.   Cholecalciferol (VITAMIN D) 50 MCG (2000 UT) tablet Take 2,000 Units by mouth daily.   diphenhydrAMINE (BENADRYL) 25 MG tablet Take 25 mg by mouth at bedtime.   fluticasone (FLONASE) 50 MCG/ACT nasal spray Place 2 sprays into both nostrils daily as needed for allergies or rhinitis.   GLUCOSAMINE-CHONDROITIN PO Take 1 tablet by mouth daily.   guaiFENesin (MUCINEX) 600 MG 12 hr tablet Take 600 mg by mouth as needed for to loosen phlegm or cough.   losartan-hydrochlorothiazide (HYZAAR) 100-25 MG tablet TAKE 1 TABLET BY MOUTH DAILY   methocarbamol (ROBAXIN) 500 MG tablet Take 1 tablet (500 mg total) by  mouth every 6 (six) hours as needed for muscle spasms.   metoprolol succinate (TOPROL-XL) 25 MG 24 hr tablet TAKE 1 TABLET BY MOUTH DAILY   naphazoline-glycerin (CLEAR EYES REDNESS) 0.012-0.25 % SOLN Place 1-2 drops into both eyes 4 (four) times daily as needed for eye irritation.   NON FORMULARY Pt uses c-pap nightly   Omega-3 Fatty Acids (FISH OIL) 1200 MG CAPS Take 1,200 mg by mouth daily.   omeprazole (PRILOSEC) 40 MG capsule TAKE 1 CAPSULE(40 MG) BY MOUTH DAILY   oxyCODONE (OXY IR/ROXICODONE) 5 MG immediate release tablet Take 1-2 tablets (5-10 mg  total) by mouth every 6 (six) hours as needed for moderate pain (pain score 4-6) (pain score 4-6).   pravastatin (PRAVACHOL) 40 MG tablet TAKE 1 TABLET(40 MG) BY MOUTH DAILY   sildenafil (REVATIO) 20 MG tablet 1-2 tabs by mouth prior to sexual activity   tamsulosin (FLOMAX) 0.4 MG CAPS capsule TAKE 1 CAPSULE(0.4 MG) BY MOUTH DAILY   Turmeric Curcumin 500 MG CAPS Take 500 mg by mouth daily.   No facility-administered encounter medications on file as of 06/10/2023.    Allergies (verified) Sulfonamide derivatives   History: Past Medical History:  Diagnosis Date   Actinic keratosis 03/20/2009   Qualifier: Diagnosis of  By: Artist Pais DO, D. Robert    Allergic conjunctivitis 06/30/2018   Anemia    ANEMIA-NOS 02/06/2009   Qualifier: Diagnosis of  By: Terrilee Croak CMA, [name]     Asthma    uses inhaler   Basal cell carcinoma 2017   left side of nose   Basal cell carcinoma of nose 10/16/2020   Borderline diabetes 05/03/2011   BPH (benign prostatic hyperplasia) 07/29/2014   Chemosis of conjunctiva of both eyes 01/15/2017   Colon polyps    Daytime somnolence 09/21/2020   Diverticulosis    Dysrhythmia    Essential hypertension 02/06/2009   Qualifier: Diagnosis of  By: Terrilee Croak CMA, [name]      Fatigue 09/21/2020   Former smoker 09/21/2020   General medical examination 11/14/2010   GERD 02/06/2009   Qualifier: Diagnosis of  By: Terrilee Croak CMA, [name]     GERD (gastroesophageal reflux disease)    on meds   High cholesterol 02/06/2009   Qualifier: Diagnosis of  By: Terrilee Croak CMA, [name]     Hx of adenomatous colonic polyps 03/07/2009   Hx of eye surgery 2018   "Left Eye was swollen and had procedure to remove excess swelling"   Hyperglycemia 05/01/2011   Hyperlipidemia    Hypertension    Hypogonadism male 04/30/2011   Lactose intolerance    Lentigo 10/16/2020   Mild intermittent asthma 06/30/2018   Neoplasm of uncertain behavior of skin 10/16/2020   Obesity (BMI 30-39.9) 09/21/2020   OSA  (obstructive sleep apnea) 11/22/2022   Osteoarthritis 09/24/2011   OVERWEIGHT 02/06/2009   Qualifier: Diagnosis of  By: Nena Jordan    Palpitations 09/21/2020   Paroxysmal atrial fibrillation (HCC) 10/06/2020   Rash/urticaria 08/07/2012   Recurrent UTI 07/30/2013   Routine general medical examination at a health care facility 12/16/2011   Secondary hypercoagulable state (HCC) 10/06/2020   SKIN LESION 03/20/2009   Qualifier: Diagnosis of  By: Nena Jordan    Snoring 09/21/2020   Unspecified asthma(493.90) 02/06/2009   Centricity Description: ASTHMA Qualifier: Diagnosis of  By: Terrilee Croak CMA, [name]   Centricity Description: ASTHMA, INTERMITTENT, MILD Qualifier: Diagnosis of  By: Peggyann Juba FNP, [name] S    URI (upper respiratory infection) 07/23/2011   Vitamin  D deficiency 09/21/2020   Past Surgical History:  Procedure Laterality Date   ATRIAL FIBRILLATION ABLATION N/A 02/08/2022   Procedure: ATRIAL FIBRILLATION ABLATION;  Surgeon: Regan Lemming, MD;  Location: [facility]IVE [facility];  Service: Cardiovascular;  Laterality: N/A;   CARDIOVERSION N/A 12/12/2022   Procedure: CARDIOVERSION;  Surgeon: Quintella Reichert, MD;  Location: [facility];  Service: Cardiovascular;  Laterality: N/A;   CYSTECTOMY  05/2004   removed fromback /sebaceous cyst   INGUINAL HERNIA REPAIR  1995   left   mohs procedure  2017   left side of nose   NASAL SEPTUM SURGERY     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   Procedure: LEFT TOTAL HIP ARTHROPLASTY ANTERIOR APPROACH;  Surgeon: Kathryne Hitch, MD;  Location: [facility] OR;  Service: Orthopedics;  Laterality: Left;   Family History  Problem Relation Age of Onset   Coronary artery disease Father        CABG X5   Diabetes Father    Colon cancer Maternal Aunt    Cancer Brother        lung (smoker)   Hypertension Other    Kidney disease Other    Social History   Socioeconomic History   Marital status: Married    Spouse name: Not on  file   Number of children: Not on file   Years of education: Not on file   Highest education level: Some college, no degree  Occupational History   Not on file  Tobacco Use   Smoking status: Former    Current packs/day: 0.00    Types: Cigarettes    Start date: 07/01/1969    Quit date: 07/01/1974    Years since quitting: 48.9   Smokeless tobacco: Never   Tobacco comments:    Former smoker 03/08/22  Vaping Use   Vaping status: Never Used  Substance and Sexual Activity   Alcohol use: No    Alcohol/week: 0.0 standard drinks of alcohol   Drug use: No   Sexual activity: Not on file  Other Topics Concern   Not on file  Social History Narrative   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[employer])   Married 33 years    1  daughter 71 - [name] (lives in [place])   1  son 58    Retired   Alcohol use-no   Smoking Status:  quit   Caffeine use/day:  2-3 beverages daily   Social Determinants of Health   Financial Resource Strain: Low Risk  (06/03/2023)   Overall Financial Resource Strain (CARDIA)    Difficulty of Paying Living Expenses: Not hard at all  Food Insecurity: No Food Insecurity (06/03/2023)   Hunger Vital Sign    Worried About Running Out of Food in the Last Year: Never true    Ran Out of Food in the Last Year: Never true  Transportation Needs: No Transportation Needs (06/03/2023)   PRAPARE - Administrator, Civil Service (Medical): No    Lack of Transportation (Non-Medical): No  Physical Activity: Sufficiently Active (06/03/2023)   Exercise Vital Sign    Days of Exercise per Week: 4 days    Minutes of Exercise per Session: 50 min  Stress: No Stress Concern Present (06/03/2023)   [employer] of Occupational Health - Occupational Stress Questionnaire    Feeling of Stress : Not at all  Social Connections: Socially Integrated (06/03/2023)   Social Connection and Isolation Panel [NHANES]    Frequency of Communication with  Friends and Family: Three times a week     Frequency of Social Gatherings with Friends and Family: Once a week    Attends Religious Services: More than 4 times per year    Active Member of [employer] or Organizations: Yes    Attends Engineer, structural: More than 4 times per year    Marital Status: Married    Tobacco Counseling Counseling given: Not Answered Tobacco comments: Former smoker 03/08/22   Clinical Intake:  Pre-visit preparation completed: Yes  Pain : No/denies pain     BMI - recorded: 32.11 Nutritional Status: BMI > 30  Obese Nutritional Risks: None Diabetes: No  How often do you need to have someone help you when you read instructions, pamphlets, or other written materials from your doctor or pharmacy?: 1 - Never  Interpreter Needed?: No  Information entered by :: Theresa Mulligan LPN   Activities of Daily Living    06/03/2023    9:15 AM 04/15/2023   12:00 PM  In your present state of health, do you have any difficulty performing the following activities:  Hearing? 1 0  Comment Wears hearing aids   Vision? 0 0  Difficulty concentrating or making decisions? 0 0  Walking or climbing stairs? 0   Dressing or bathing? 0   Doing errands, shopping? 0 0  Preparing Food and eating ? N   Using the Toilet? N   In the past six months, have you accidently leaked urine? N   Do you have problems with loss of bowel control? N   Managing your Medications? N   Managing your Finances? N   Housekeeping or managing your Housekeeping? N     Patient Care Team: Sandford Craze, NP as PCP - General (Internal Medicine) Thomasene Ripple, DO as PCP - Cardiology (Cardiology) Regan Lemming, MD as PCP - Electrophysiology (Cardiology)  Indicate any recent Medical Services you may have received from other than [facility] providers in the past year (date may be approximate).     Assessment:   This is a routine wellness examination for Shariq.  Hearing/Vision screen Hearing Screening - Comments:: Wears hearing  aids Vision Screening - Comments:: Wears rx glasses - up to date with routine eye exams with  [facility]   Goals Addressed               This Visit's Progress     Patient Stated (pt-stated)        Maintain and improve current health.       Depression Screen    06/10/2023    9:48 AM 06/18/2022    9:57 AM 05/28/2022    4:07 PM 12/10/2021   11:39 AM 05/18/2021    9:57 AM 09/11/2020   12:00 PM 12/05/2017   11:52 AM  PHQ 2/9 Scores  PHQ - 2 Score 0 0 0 0 0 0 0  PHQ- 9 Score       1    Fall Risk    06/10/2023    9:42 AM 06/03/2023    9:15 AM 06/18/2022    9:57 AM 05/28/2022    4:07 PM 05/24/2022   10:04 AM  Fall Risk   Falls in the past year? 1 1 0 1 0  Number falls in past yr: 0 1 0 1   Injury with Fall? 0 0 0 0 0  Risk for fall due to : No Fall Risks  No Fall Risks Impaired balance/gait   Follow up Falls prevention  discussed  Falls evaluation completed Falls evaluation completed     MEDICARE RISK AT HOME: Medicare Risk at Home Any stairs in or around the home?: Yes If so, are there any without handrails?: No Home free of loose throw rugs in walkways, pet beds, electrical cords, etc?: Yes Adequate lighting in your home to reduce risk of falls?: Yes Life alert?: No Use of a cane, walker or w/c?: No Grab bars in the bathroom?: No Shower chair or bench in shower?: Yes Elevated toilet seat or a handicapped toilet?: No  TIMED UP AND GO:  Was the test performed?  No    Cognitive Function:        06/10/2023    9:43 AM  6CIT Screen  What Year? 0 points  What month? 0 points  What time? 0 points  Count back from 20 0 points  Months in reverse 0 points  Repeat phrase 0 points  Total Score 0 points    Immunizations Immunization History  Administered Date(s) Administered   Influenza Split 04/28/2012, 04/30/2020, 04/01/2021   Influenza, High Dose Seasonal PF 03/13/2022   Influenza,inj,Quad PF,6+ Mos 04/19/2014, 03/24/2016, 03/23/2017, 05/03/2018,  03/30/2019   Influenza-Unspecified 03/31/2013, 04/01/2015, 03/24/2016, 05/03/2018   PFIZER(Purple Top)SARS-COV-2 Vaccination 08/27/2019, 09/22/2019   PNEUMOCOCCAL CONJUGATE-20 12/10/2021   Pneumococcal Polysaccharide-23 01/11/2020   Td 01/11/2010   Tdap 03/30/2019   Zoster Recombinant(Shingrix) 04/01/2021, 07/06/2021   Zoster, Live 01/05/2014    TDAP status: Up to date  Flu Vaccine status: Up to date  Pneumococcal vaccine status: Up to date    Qualifies for Shingles Vaccine? Yes   Zostavax completed Yes   Shingrix Completed?: Yes  Screening Tests Health Maintenance  Topic Date Due   Medicare Annual Wellness (AWV)  06/09/2024   Colonoscopy  10/26/2024   DTaP/Tdap/Td (3 - Td or Tdap) 03/29/2029   Pneumonia Vaccine 31+ Years old  Completed   INFLUENZA VACCINE  Completed   Hepatitis C Screening  Completed   Zoster Vaccines- Shingrix  Completed   HPV VACCINES  Aged Out   COVID-19 Vaccine  Discontinued    Health Maintenance  There are no preventive care reminders to display for this patient.   Colorectal cancer screening: Type of screening: Colonoscopy. Completed 10/27/14. Repeat every 10 years    Additional Screening:  Hepatitis C Screening: does qualify; Completed 12/25/15  Vision Screening: Recommended annual ophthalmology exams for early detection of glaucoma and other disorders of the eye. Is the patient up to date with their annual eye exam?  Yes  Who is the provider or what is the name of the office in which the patient attends annual eye exams? [facility] If pt is not established with a provider, would they like to be referred to a provider to establish care? No .   Dental Screening: Recommended annual dental exams for proper oral hygiene    Community Resource Referral / Chronic Care Management:  CRR required this visit?  No   CCM required this visit?  No     Plan:     I have personally reviewed and noted the following in the patient's chart:    Medical and social history Use of alcohol, tobacco or illicit drugs  Current medications and supplements including opioid prescriptions. Patient is not currently taking opioid prescriptions. Functional ability and status Nutritional status Physical activity Advanced directives List of other physicians Hospitalizations, surgeries, and ER visits in previous 12 months Vitals Screenings to include cognitive, depression, and falls Referrals and appointments  In addition, I have reviewed and discussed with patient certain preventive protocols, quality metrics, and best practice recommendations. A written personalized care plan for preventive services as well as general preventive health recommendations were provided to patient.     Tillie Rung, LPN   56/21/3086   After Visit Summary: (MyChart) Due to this being a telephonic visit, the after visit summary with patients personalized plan was offered to patient via MyChart   Nurse Notes: None

## 2023-06-11 DIAGNOSIS — I4819 Other persistent atrial fibrillation: Secondary | ICD-10-CM | POA: Diagnosis not present

## 2023-06-12 ENCOUNTER — Telehealth: Payer: Self-pay | Admitting: Family

## 2023-06-12 DIAGNOSIS — E875 Hyperkalemia: Secondary | ICD-10-CM

## 2023-06-12 LAB — CBC
Hematocrit: 43.9 % (ref 37.5–51.0)
Hemoglobin: 13.3 g/dL (ref 13.0–17.7)
MCH: 28.1 pg (ref 26.6–33.0)
MCHC: 30.3 g/dL — ABNORMAL LOW (ref 31.5–35.7)
MCV: 93 fL (ref 79–97)
Platelets: 168 10*3/uL (ref 150–450)
RBC: 4.73 x10E6/uL (ref 4.14–5.80)
RDW: 13.4 % (ref 11.6–15.4)
WBC: 4.7 10*3/uL (ref 3.4–10.8)

## 2023-06-12 LAB — BASIC METABOLIC PANEL
BUN/Creatinine Ratio: 16 (ref 10–24)
BUN: 17 mg/dL (ref 8–27)
CO2: 24 mmol/L (ref 20–29)
Calcium: 9.5 mg/dL (ref 8.6–10.2)
Chloride: 103 mmol/L (ref 96–106)
Creatinine, Ser: 1.09 mg/dL (ref 0.76–1.27)
Glucose: 89 mg/dL (ref 70–99)
Potassium: 5.5 mmol/L — ABNORMAL HIGH (ref 3.5–5.2)
Sodium: 144 mmol/L (ref 134–144)
eGFR: 74 mL/min/{1.73_m2} (ref >59)

## 2023-06-12 NOTE — Telephone Encounter (Signed)
Please advise pt that his potassium was elevated at cardiology.  I would like to repeat bmet in 2 weeks please. Dx hyperkalemia

## 2023-06-12 NOTE — Telephone Encounter (Signed)
Patient notified of this information and scheduled to come in 06/26/23 for labs

## 2023-06-12 NOTE — Telephone Encounter (Signed)
-----   Message from [facility] sent at 06/12/2023  9:57 AM EST ----- K elevated. Needs low K foods, otherwise labs normal.

## 2023-06-16 ENCOUNTER — Encounter: Payer: Self-pay | Admitting: Cardiology

## 2023-06-24 ENCOUNTER — Ambulatory Visit (HOSPITAL_COMMUNITY)
Admission: RE | Admit: 2023-06-24 | Discharge: 2023-06-24 | Disposition: A | Discharge status: Home / Self Care | Payer: Medicare Other | Source: Ambulatory Visit | Attending: Cardiology | Admitting: Cardiology

## 2023-06-24 DIAGNOSIS — I480 Paroxysmal atrial fibrillation: Secondary | ICD-10-CM | POA: Diagnosis not present

## 2023-06-24 MED ORDER — IOHEXOL 350 MG/ML SOLN
95.0000 mL | Freq: Once | INTRAVENOUS | Status: AC | PRN
  Administered 2023-06-24: 95 mL via INTRAVENOUS

## 2023-06-26 ENCOUNTER — Other Ambulatory Visit (INDEPENDENT_AMBULATORY_CARE_PROVIDER_SITE_OTHER): Payer: Medicare Other

## 2023-06-26 DIAGNOSIS — E875 Hyperkalemia: Secondary | ICD-10-CM

## 2023-06-26 LAB — BASIC METABOLIC PANEL
BUN: 13 mg/dL (ref 6–23)
CO2: 29 meq/L (ref 19–32)
Calcium: 8.4 mg/dL (ref 8.4–10.5)
Chloride: 104 meq/L (ref 96–112)
Creatinine, Ser: 1.03 mg/dL (ref 0.40–1.50)
GFR: 74.41 mL/min (ref >60.00)
Glucose, Bld: 110 mg/dL — ABNORMAL HIGH (ref 70–99)
Potassium: 4.2 meq/L (ref 3.5–5.1)
Sodium: 140 meq/L (ref 135–145)

## 2023-07-04 ENCOUNTER — Other Ambulatory Visit: Payer: Self-pay | Admitting: Family

## 2023-07-04 DIAGNOSIS — E782 Mixed hyperlipidemia: Secondary | ICD-10-CM

## 2023-07-07 NOTE — Pre-Procedure Instructions (Signed)
 Attempted to call patient regarding procedure instructions.  Left voicemail on  the following items: Arrival time 0800 Nothing to eat or drink after midnight No meds AM of procedure Responsible person to drive you home and stay with you for 24 hrs  Have you missed any doses of anti-coagulant Eliquis - should be taken twice a day, if you missed any doses please let us  know.

## 2023-07-08 ENCOUNTER — Ambulatory Visit (HOSPITAL_COMMUNITY): Payer: Medicare Other

## 2023-07-08 ENCOUNTER — Ambulatory Visit (HOSPITAL_BASED_OUTPATIENT_CLINIC_OR_DEPARTMENT_OTHER): Payer: Medicare Other

## 2023-07-08 ENCOUNTER — Ambulatory Visit (HOSPITAL_COMMUNITY)
Admission: RE | Admit: 2023-07-08 | Discharge: 2023-07-08 | Disposition: A | Discharge status: Home / Self Care | Payer: Medicare Other | Source: Ambulatory Visit | Attending: Cardiology | Admitting: Cardiology

## 2023-07-08 ENCOUNTER — Other Ambulatory Visit: Payer: Self-pay

## 2023-07-08 ENCOUNTER — Encounter (HOSPITAL_COMMUNITY)
Admission: RE | Disposition: A | Discharge status: Home / Self Care | Payer: Medicare Other | Source: Ambulatory Visit | Attending: Cardiology

## 2023-07-08 DIAGNOSIS — I2510 Atherosclerotic heart disease of native coronary artery without angina pectoris: Secondary | ICD-10-CM | POA: Insufficient documentation

## 2023-07-08 DIAGNOSIS — G4730 Sleep apnea, unspecified: Secondary | ICD-10-CM | POA: Insufficient documentation

## 2023-07-08 DIAGNOSIS — I4819 Other persistent atrial fibrillation: Secondary | ICD-10-CM | POA: Insufficient documentation

## 2023-07-08 DIAGNOSIS — K219 Gastro-esophageal reflux disease without esophagitis: Secondary | ICD-10-CM | POA: Diagnosis not present

## 2023-07-08 DIAGNOSIS — I10 Essential (primary) hypertension: Secondary | ICD-10-CM | POA: Insufficient documentation

## 2023-07-08 DIAGNOSIS — Z87891 Personal history of nicotine dependence: Secondary | ICD-10-CM | POA: Diagnosis not present

## 2023-07-08 DIAGNOSIS — I4891 Unspecified atrial fibrillation: Secondary | ICD-10-CM | POA: Diagnosis not present

## 2023-07-08 DIAGNOSIS — E785 Hyperlipidemia, unspecified: Secondary | ICD-10-CM | POA: Diagnosis not present

## 2023-07-08 DIAGNOSIS — Z79899 Other long term (current) drug therapy: Secondary | ICD-10-CM | POA: Insufficient documentation

## 2023-07-08 HISTORY — PX: ATRIAL FIBRILLATION ABLATION: EP1191

## 2023-07-08 SURGERY — ATRIAL FIBRILLATION ABLATION
Anesthesia: General

## 2023-07-08 MED ORDER — SODIUM CHLORIDE 0.9% FLUSH: 3.0000 mL | INTRAVENOUS | Status: DC | PRN

## 2023-07-08 MED ORDER — ONDANSETRON HCL 4 MG/2ML IJ SOLN
INTRAMUSCULAR | Status: DC | PRN
  Administered 2023-07-08: 4 mg via INTRAVENOUS

## 2023-07-08 MED ORDER — SUGAMMADEX SODIUM 200 MG/2ML IV SOLN
INTRAVENOUS | Status: DC | PRN
  Administered 2023-07-08: 200 mg via INTRAVENOUS

## 2023-07-08 MED ORDER — ONDANSETRON HCL 4 MG/2ML IJ SOLN: 4.0000 mg | Freq: Four times a day (QID) | INTRAMUSCULAR | Status: DC | PRN

## 2023-07-08 MED ORDER — HEPARIN (PORCINE) IN NACL 1000-0.9 UT/500ML-% IV SOLN
INTRAVENOUS | Status: DC | PRN
  Administered 2023-07-08 (×3): 500 mL

## 2023-07-08 MED ORDER — PROPOFOL 10 MG/ML IV BOLUS
INTRAVENOUS | Status: DC | PRN
  Administered 2023-07-08: 100 mg via INTRAVENOUS

## 2023-07-08 MED ORDER — SODIUM CHLORIDE 0.9 % IV SOLN: INTRAVENOUS | Status: DC

## 2023-07-08 MED ORDER — SODIUM CHLORIDE 0.9 % IV SOLN: 250.0000 mL | INTRAVENOUS | Status: DC | PRN

## 2023-07-08 MED ORDER — ATROPINE SULFATE 0.4 MG/ML IV SOLN
INTRAVENOUS | Status: DC | PRN
  Administered 2023-07-08: 1 mg via INTRAVENOUS

## 2023-07-08 MED ORDER — PROPOFOL 500 MG/50ML IV EMUL
INTRAVENOUS | Status: DC | PRN
  Administered 2023-07-08: 50 ug/kg/min via INTRAVENOUS

## 2023-07-08 MED ORDER — SODIUM CHLORIDE 0.9% FLUSH: 3.0000 mL | Freq: Two times a day (BID) | INTRAVENOUS | Status: DC

## 2023-07-08 MED ORDER — PROTAMINE SULFATE 10 MG/ML IV SOLN
INTRAVENOUS | Status: DC | PRN
  Administered 2023-07-08: 40 mg via INTRAVENOUS

## 2023-07-08 MED ORDER — FENTANYL CITRATE (PF) 250 MCG/5ML IJ SOLN
INTRAMUSCULAR | Status: DC | PRN
  Administered 2023-07-08 (×2): 50 ug via INTRAVENOUS

## 2023-07-08 MED ORDER — HEPARIN SODIUM (PORCINE) 1000 UNIT/ML IJ SOLN
INTRAMUSCULAR | Status: DC | PRN
  Administered 2023-07-08: 14000 [IU] via INTRAVENOUS

## 2023-07-08 MED ORDER — ACETAMINOPHEN 325 MG PO TABS: 650.0000 mg | ORAL_TABLET | ORAL | Status: DC | PRN

## 2023-07-08 MED ORDER — MIDAZOLAM HCL 2 MG/2ML IJ SOLN
INTRAMUSCULAR | Status: DC | PRN
  Administered 2023-07-08: 2 mg via INTRAVENOUS

## 2023-07-08 MED ORDER — LIDOCAINE 2% (20 MG/ML) 5 ML SYRINGE
INTRAMUSCULAR | Status: DC | PRN
  Administered 2023-07-08: 40 mg via INTRAVENOUS

## 2023-07-08 MED ORDER — ROCURONIUM BROMIDE 10 MG/ML (PF) SYRINGE
PREFILLED_SYRINGE | INTRAVENOUS | Status: DC | PRN
  Administered 2023-07-08: 50 mg via INTRAVENOUS
  Administered 2023-07-08: 30 mg via INTRAVENOUS

## 2023-07-08 MED ORDER — DEXAMETHASONE SODIUM PHOSPHATE 10 MG/ML IJ SOLN
INTRAMUSCULAR | Status: DC | PRN
  Administered 2023-07-08: 10 mg via INTRAVENOUS

## 2023-07-08 SURGICAL SUPPLY — 20 items
BAG SNAP BAND KOVER 36X36 (MISCELLANEOUS) IMPLANT
CABLE PFA RX CATH CONN (CABLE) IMPLANT
CATH FARAWAVE ABLATION 31 (CATHETERS) IMPLANT
CATH OCTARAY 2.0 F 3-3-3-3-3 (CATHETERS) IMPLANT
CATH SOUNDSTAR ECO 8FR (CATHETERS) IMPLANT
CATH WEBSTER BI DIR CS D-F CRV (CATHETERS) IMPLANT
CLOSURE MYNX CONTROL 6F/7F (Vascular Products) IMPLANT
CLOSURE PERCLOSE PROSTYLE (VASCULAR PRODUCTS) IMPLANT
COVER SWIFTLINK CONNECTOR (BAG) ×2 IMPLANT
DILATOR VESSEL 38 20CM 16FR (INTRODUCER) IMPLANT
GUIDEWIRE INQWIRE 1.5J.035X260 (WIRE) IMPLANT
INQWIRE 1.5J .035X260CM (WIRE) ×1 IMPLANT
KIT VERSACROSS CNCT FARADRIVE (KITS) IMPLANT
PACK EP LF (CUSTOM PROCEDURE TRAY) ×2 IMPLANT
PAD DEFIB RADIO PHYSIO CONN (PAD) ×2 IMPLANT
PATCH CARTO3 (PAD) IMPLANT
SHEATH FARADRIVE STEERABLE (SHEATH) IMPLANT
SHEATH PINNACLE 8F 10CM (SHEATH) IMPLANT
SHEATH PINNACLE 9F 10CM (SHEATH) IMPLANT
SHEATH PROBE COVER 6X72 (BAG) IMPLANT

## 2023-07-08 NOTE — Anesthesia Procedure Notes (Signed)
 Procedure Name: Intubation Date/Time: 07/08/2023 9:49 AM  Performed by: Moishe Reyes CROME, CRNAPre-anesthesia Checklist: Patient identified, Emergency Drugs available, Suction available, Patient being monitored and Timeout performed Patient Re-evaluated:Patient Re-evaluated prior to induction Oxygen Delivery Method: Circle system utilized Preoxygenation: Pre-oxygenation with 100% oxygen Induction Type: IV induction Ventilation: Mask ventilation without difficulty Laryngoscope Size: McGrath and 3 Grade View: Grade I Tube type: Oral Tube size: 7.0 mm Number of attempts: 1 Airway Equipment and Method: Stylet Placement Confirmation: ETT inserted through vocal cords under direct vision Secured at: 23 cm Tube secured with: Tape Dental Injury: Teeth and Oropharynx as per pre-operative assessment

## 2023-07-08 NOTE — Transfer of Care (Signed)
 Immediate Anesthesia Transfer of Care Note  Patient: Aaron Ruiz  Procedure(s) Performed: ATRIAL FIBRILLATION ABLATION  Patient Location: PACU and Cath Lab  Anesthesia Type:General  Level of Consciousness: awake  Airway & Oxygen Therapy: Patient connected to nasal cannula oxygen  Post-op Assessment: Report given to RN  Post vital signs: stable  Last Vitals:  Vitals Value Taken Time  BP    Temp    Pulse    Resp    SpO2      Last Pain:  Vitals:   07/08/23 0826  PainSc: 0-No pain         Complications: There were no known notable events for this encounter.

## 2023-07-08 NOTE — Anesthesia Postprocedure Evaluation (Signed)
 Anesthesia Post Note  Patient: Aaron Ruiz  Procedure(s) Performed: ATRIAL FIBRILLATION ABLATION     Patient location during evaluation: PACU Anesthesia Type: General Level of consciousness: awake and alert Pain management: pain level controlled Vital Signs Assessment: post-procedure vital signs reviewed and stable Respiratory status: spontaneous breathing, nonlabored ventilation, respiratory function stable and patient connected to nasal cannula oxygen Cardiovascular status: blood pressure returned to baseline and stable Postop Assessment: no apparent nausea or vomiting Anesthetic complications: no  There were no known notable events for this encounter.  Last Vitals:  Vitals:   07/08/23 1130 07/08/23 1215  BP:  129/69  Pulse: 66 65  Resp: 18 14  Temp:    SpO2: 95% 94%    Last Pain:  Vitals:   07/08/23 1054  TempSrc: Temporal  PainSc: 0-No pain   Pain Goal:                   Franky JONETTA Bald

## 2023-07-08 NOTE — Anesthesia Preprocedure Evaluation (Signed)
 Anesthesia Evaluation  Patient identified by MRN, date of birth, ID band Patient awake    Reviewed: Allergy  & Precautions, NPO status , Patient's Chart, lab work & pertinent test results, reviewed documented beta blocker date and time   Airway Mallampati: II  TM Distance: >3 FB Neck ROM: Full    Dental  (+) Teeth Intact, Dental Advisory Given   Pulmonary asthma , sleep apnea , former smoker   breath sounds clear to auscultation       Cardiovascular hypertension, Pt. on medications and Pt. on home beta blockers + CAD  + dysrhythmias Atrial Fibrillation  Rhythm:Regular Rate:Normal     Neuro/Psych negative neurological ROS  negative psych ROS   GI/Hepatic Neg liver ROS,GERD  Medicated,,  Endo/Other  negative endocrine ROS    Renal/GU negative Renal ROS     Musculoskeletal  (+) Arthritis ,    Abdominal   Peds  Hematology  (+) Blood dyscrasia, anemia   Anesthesia Other Findings   Reproductive/Obstetrics                             Anesthesia Physical Anesthesia Plan  ASA: 3  Anesthesia Plan: General   Post-op Pain Management: Minimal or no pain anticipated   Induction: Intravenous  PONV Risk Score and Plan: 3 and Ondansetron , Midazolam  and Dexamethasone   Airway Management Planned: Oral ETT  Additional Equipment: None  Intra-op Plan:   Post-operative Plan: Extubation in OR  Informed Consent: I have reviewed the patients History and Physical, chart, labs and discussed the procedure including the risks, benefits and alternatives for the proposed anesthesia with the patient or authorized representative who has indicated his/her understanding and acceptance.     Dental advisory given  Plan Discussed with: CRNA  Anesthesia Plan Comments:        Anesthesia Quick Evaluation

## 2023-07-08 NOTE — Progress Notes (Signed)
 On arrival from cath lab, O2 sats running 86-88 and O2 started at 2l/min with sat increasing to 93

## 2023-07-08 NOTE — H&P (Signed)
  Electrophysiology Office Note:   Date:  07/08/2023  ID:  Aaron Ruiz, DOB 08-07-1955, MRN [id number]  Primary Cardiologist: [name] [name], DO Electrophysiologist: Soyla [name], MD      History of Present Illness:   Aaron Ruiz is a 68 y.o. male with h/o atrial fibrillation, hyperlipidemia, hypertension seen today for routine electrophysiology followup.   Today, denies symptoms of palpitations, chest pain, shortness of breath, orthopnea, PND, lower extremity edema, claudication, dizziness, presyncope, syncope, bleeding, or neurologic sequela. The patient is tolerating medications without difficulties. Plan ablation today.   EP Information / Studies Reviewed:    EKG is ordered today. Personal review as below.        Risk Assessment/Calculations:    CHA2DS2-VASc Score = 3   This indicates a 3.2% annual risk of stroke. The patient's score is based upon: CHF History: 0 HTN History: 1 Diabetes History: 0 Stroke History: 0 Vascular Disease History: 1 Age Score: 1 Gender Score: 0     Physical Exam:   VS:  BP (!) 145/66   Pulse 62   Resp 17   Ht 5' 7 (1.702 m)   Wt 93 kg   SpO2 93%   BMI 32.11 kg/m    Wt Readings from Last 3 Encounters:  07/08/23 93 kg  06/10/23 93 kg  04/15/23 95.3 kg    GEN: No acute distress.   Neck: No JVD Cardiac: RRR, no murmurs, rubs, or gallops.  Respiratory: decreased BS bases bilaterally. GI: Soft, nontender, non-distended  MS: No edema; No deformity. Neuro:  Nonfocal  Skin: warm and dry Psych: Normal affect    ASSESSMENT AND PLAN:    1.  Persistent atrial fibrillation: Aaron Ruiz has presented today for surgery, with the diagnosis of AF.  The various methods of treatment have been discussed with the patient and family. After consideration of risks, benefits and other options for treatment, the patient has consented to  Procedure(s): Catheter ablation as a surgical intervention .  Risks include but not limited to  complete heart block, stroke, esophageal damage, nerve damage, bleeding, vascular damage, tamponade, perforation, MI, and death. The patient's history has been reviewed, patient examined, no change in status, stable for surgery.  I have reviewed the patient's chart and labs.  Questions were answered to the patient's satisfaction.    Hooper Petteway Norton, MD 07/08/2023 8:51 AM

## 2023-07-08 NOTE — Progress Notes (Signed)
 Discussed with patient and wife the stopping of amiodarone. Informed them that the change is accurate on the AVS in My Chart.

## 2023-07-08 NOTE — Discharge Instructions (Signed)
 Cardiac Ablation, Care After  This sheet gives you information about how to care for yourself after your procedure. Your health care provider may also give you more specific instructions. If you have problems or questions, contact your health care provider. What can I expect after the procedure? After the procedure, it is common to have: Bruising around your puncture site. Tenderness around your puncture site. Skipped heartbeats. If you had an atrial fibrillation ablation, you may have atrial fibrillation during the first several months after your procedure.  Tiredness (fatigue).  Follow these instructions at home: Puncture site care  Follow instructions from your health care provider about how to take care of your puncture site. Make sure you: Remove square bandages tomorrow (07/09/2023) at 12 noon.  Check your puncture site every day for signs of infection. Check for: Redness, swelling, or pain. Fluid or blood. If your puncture site starts to bleed, lie down on your back, apply firm pressure to the area, and contact your health care provider. Warmth. Pus or a bad smell. A pea or small marble sized lump at the site is normal and can take up to three months to resolve.  Driving Do not drive for at least 4 days after your procedure or however long your health care provider recommends. (Do not resume driving if you have previously been instructed not to drive for other health reasons.) Do not drive or use heavy machinery while taking prescription pain medicine. Activity Avoid activities that take a lot of effort for at least 7 days after your procedure. Do not lift anything that is heavier than 5 lb (4.5 kg) for one week.  No sexual activity for 1 week.  Return to your normal activities as told by your health care provider. Ask your health care provider what activities are safe for you. General instructions Take over-the-counter and prescription medicines only as told by your health care  provider. Do not use any products that contain nicotine or tobacco, such as cigarettes and e-cigarettes. If you need help quitting, ask your health care provider. You may shower after removal of dressing, but Do not take baths, swim, or use a hot tub for 1 week.  Do not drink alcohol for 24 hours after your procedure. Keep all follow-up visits as told by your health care provider. This is important. Contact a health care provider if: You have redness, mild swelling, or pain around your puncture site. You have fluid or blood coming from your puncture site that stops after applying firm pressure to the area for 15 minutes. Your puncture site feels warm to the touch. You have pus or a bad smell coming from your puncture site. You have a fever. You have chest pain or discomfort that spreads to your neck, jaw, or arm. You have chest pain that is worse with lying on your back or taking a deep breath. You are sweating a lot. You feel nauseous. You have a fast or irregular heartbeat. You have shortness of breath. You are dizzy or light-headed and feel the need to lie down. You have pain or numbness in the arm or leg closest to your puncture site. Get help right away if: Your puncture site suddenly swells. Lay flat apply firm pressure for 15 minutes. CALL 911 Your puncture site is bleeding and the bleeding does not stop after applying firm pressure to the area for 15 minutes. These symptoms may represent a serious problem that is an emergency. Do not wait to see if the symptoms  will go away. Get medical help right away. Call your local emergency services (911 in the [place] Do not drive yourself to the hospital. Summary After the procedure, it is normal to have bruising and tenderness at the puncture site in your groin, neck, or forearm. Check your puncture site every day for signs of infection. Get help right away if your puncture site is bleeding and the bleeding does not stop after applying firm  pressure to the area. This is a medical emergency. This information is not intended to replace advice given to you by your health care provider. Make sure you discuss any questions you have with your health care provider.

## 2023-07-09 ENCOUNTER — Encounter (HOSPITAL_COMMUNITY): Payer: Self-pay | Admitting: Cardiology

## 2023-07-14 ENCOUNTER — Encounter: Payer: Self-pay | Admitting: Orthopaedic Surgery

## 2023-07-23 ENCOUNTER — Other Ambulatory Visit: Payer: Self-pay | Admitting: Cardiology

## 2023-07-23 ENCOUNTER — Other Ambulatory Visit: Payer: Self-pay | Admitting: Family

## 2023-07-23 DIAGNOSIS — I480 Paroxysmal atrial fibrillation: Secondary | ICD-10-CM

## 2023-07-23 DIAGNOSIS — I10 Essential (primary) hypertension: Secondary | ICD-10-CM

## 2023-07-23 NOTE — Telephone Encounter (Signed)
Eliquis 5mg  refill request received. Patient is 68 years old, weight-93kg, Crea-1.03 on 06/26/23, Diagnosis-Afib, and last seen by Dr. Elberta Fortis on 02/28/23. Dose is appropriate based on dosing criteria. Will send in refill to requested pharmacy.

## 2023-08-05 ENCOUNTER — Ambulatory Visit (HOSPITAL_COMMUNITY)
Admission: RE | Admit: 2023-08-05 | Discharge: 2023-08-05 | Disposition: A | Discharge status: Home / Self Care | Payer: Medicare Other | Source: Ambulatory Visit | Attending: Internal Medicine | Admitting: Internal Medicine

## 2023-08-05 ENCOUNTER — Encounter (HOSPITAL_COMMUNITY): Payer: Self-pay | Admitting: Internal Medicine

## 2023-08-05 VITALS — BP 132/60 | HR 52 | Ht 67.0 in | Wt 207.8 lb

## 2023-08-05 DIAGNOSIS — E785 Hyperlipidemia, unspecified: Secondary | ICD-10-CM | POA: Diagnosis not present

## 2023-08-05 DIAGNOSIS — Z8249 Family history of ischemic heart disease and other diseases of the circulatory system: Secondary | ICD-10-CM | POA: Diagnosis not present

## 2023-08-05 DIAGNOSIS — D6869 Other thrombophilia: Secondary | ICD-10-CM | POA: Diagnosis not present

## 2023-08-05 DIAGNOSIS — Z7901 Long term (current) use of anticoagulants: Secondary | ICD-10-CM | POA: Diagnosis not present

## 2023-08-05 DIAGNOSIS — I480 Paroxysmal atrial fibrillation: Secondary | ICD-10-CM | POA: Diagnosis not present

## 2023-08-05 DIAGNOSIS — I10 Essential (primary) hypertension: Secondary | ICD-10-CM | POA: Diagnosis not present

## 2023-08-05 NOTE — Progress Notes (Signed)
 Primary Care Physician: Daryl Setter, NP Primary Cardiologist: Dr Sheena Primary Electrophysiologist: Dr Inocencio Referring Physician: [facility]   Aaron Ruiz is a 68 y.o. male with a history of HTN, HLD, atrial fibrillation who presents for follow up in the [facility]. The patient was initially diagnosed with atrial fibrillation 10/03/20 after presenting to [facility] ED with symptoms of tachypalpitations. He had just seen Dr Sheena who placed a cardiac monitor to screen for arrhythmias. Patient was started on Eliquis  for a CHADS2VASC score of 2. He was recently taken off of metoprolol  due to bradycardia, this was resumed at the ED. He denies any significant alcohol use but does admit to snoring and some daytime somnolence. Patient was seen by Dr Inocencio and started on flecainide . He eventually underwent afib ablation 02/08/22.   On follow up today, patient reports that he has done well since his ablation with no episodes of afib. He denies CP, swallowing pain, or groin issues. No bleeding issues on anticoagulation.   On follow up 12/09/22, patient is currently in atrial flutter. His ECG is abnormal and shows wide QRS complexes. He reports to be out of rhythm since this past Friday. He feels lightheaded and a little SOB with minimal exertion. He notes previously episodes were always brief and would convert to NSR quickly; over past couple of months episodes have seemed to last longer. Flecainide  increased to 150 mg BID on 6/3 per phone notes. He has not missed any doses of Eliquis .  On follow up 12/16/22, he is currently in atrial flutter. S/p successful DCCV on 6/13 with ERAF. He called on 6/14 noting HR 120-130s. Amlodipine  held and taking extra Toprol  for total of 50 mg daily.   On follow up 08/05/23, patient is currently in NSR. S/p Afib ablation on 07/08/23 by Dr. Inocencio. No episodes of Afib since ablation. He is doing great. He is walking  frequently and going to the gym. No chest pain or SOB. Leg sites healed without issue. No missed doses of Eliquis  5 mg BID.  Today, he denies symptoms of orthopnea, PND, lower extremity edema, dizziness, presyncope, syncope, snoring, daytime somnolence, bleeding, or neurologic sequela. The patient is tolerating medications without difficulties and is otherwise without complaint today.    Atrial Fibrillation Risk Factors:  he does have symptoms or diagnosis of sleep apnea. Patient declined sleep study. he does not have a history of rheumatic fever. he does not have a history of alcohol use. The patient does have a history of early familial atrial fibrillation or other arrhythmias. Sister has afib s/p ablation.  he has a BMI of Body mass index is 32.55 kg/m.[name]   08/05/23 0909  Weight: 94.3 kg     Family History  Problem Relation Age of Onset   Coronary artery disease Father        CABG X5   Diabetes Father    Colon cancer Maternal Aunt    Cancer Brother        lung (smoker)   Hypertension Other    Kidney disease Other     Atrial Fibrillation Management history:  Previous antiarrhythmic drugs: flecainide   Previous cardioversions: none Previous ablations: 02/08/22; 07/08/23 CHADS2VASC score: 2 Anticoagulation history: Eliquis    Past Medical History:  Diagnosis Date   Actinic keratosis 03/20/2009   Qualifier: Diagnosis of  By: Georgian DO, CHARM [place]    Allergic conjunctivitis 06/30/2018   Anemia    ANEMIA-NOS 02/06/2009  Qualifier: Diagnosis of  By: Anice CMA, [name]     Asthma    uses inhaler   Basal cell carcinoma 2017   left side of nose   Basal cell carcinoma of nose 10/16/2020   Borderline diabetes 05/03/2011   BPH (benign prostatic hyperplasia) 07/29/2014   Chemosis of conjunctiva of both eyes 01/15/2017   Colon polyps    Daytime somnolence 09/21/2020   Diverticulosis    Dysrhythmia    Essential hypertension 02/06/2009   Qualifier: Diagnosis of   By: Anice CMA, [name]      Fatigue 09/21/2020   Former smoker 09/21/2020   General medical examination 11/14/2010   GERD 02/06/2009   Qualifier: Diagnosis of  By: Anice CMA, [name]     GERD (gastroesophageal reflux disease)    on meds   High cholesterol 02/06/2009   Qualifier: Diagnosis of  By: Anice CMA, [name]     Hx of adenomatous colonic polyps 03/07/2009   Hx of eye surgery 2018   Left Eye was swollen and had procedure to remove excess swelling   Hyperglycemia 05/01/2011   Hyperlipidemia    Hypertension    Hypogonadism male 04/30/2011   Lactose intolerance    Lentigo 10/16/2020   Mild intermittent asthma 06/30/2018   Neoplasm of uncertain behavior of skin 10/16/2020   Obesity (BMI 30-39.9) 09/21/2020   OSA (obstructive sleep apnea) 11/22/2022   Osteoarthritis 09/24/2011   OVERWEIGHT 02/06/2009   Qualifier: Diagnosis of  By: [name]    Palpitations 09/21/2020   Paroxysmal atrial fibrillation (HCC) 10/06/2020   Rash/urticaria 08/07/2012   Recurrent UTI 07/30/2013   Routine general medical examination at a health care facility 12/16/2011   Secondary hypercoagulable state (HCC) 10/06/2020   SKIN LESION 03/20/2009   Qualifier: Diagnosis of  By: [name]    Snoring 09/21/2020   Unspecified asthma(493.90) 02/06/2009   Centricity Description: ASTHMA Qualifier: Diagnosis of  By: Anice CMA, [name]   Centricity Description: ASTHMA, INTERMITTENT, MILD Qualifier: Diagnosis of  By: Daryl FNP, [name] S    URI (upper respiratory infection) 07/23/2011   Vitamin D  deficiency 09/21/2020   Past Surgical History:  Procedure Laterality Date   ATRIAL FIBRILLATION ABLATION N/A 02/08/2022   Procedure: ATRIAL FIBRILLATION ABLATION;  Surgeon: Inocencio Soyla Lunger, MD;  Location: [facility];  Service: Cardiovascular;  Laterality: N/A;   ATRIAL FIBRILLATION ABLATION N/A 07/08/2023   Procedure: ATRIAL FIBRILLATION ABLATION;  Surgeon: Inocencio Soyla Lunger,  MD;  Location: [facility];  Service: Cardiovascular;  Laterality: N/A;   CARDIOVERSION N/A 12/12/2022   Procedure: CARDIOVERSION;  Surgeon: Shlomo Wilbert SAUNDERS, MD;  Location: [facility];  Service: Cardiovascular;  Laterality: N/A;   CYSTECTOMY  05/2004   removed fromback /sebaceous cyst   INGUINAL HERNIA REPAIR  1995   left   mohs procedure  2017   left side of nose   NASAL SEPTUM SURGERY     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   Procedure: LEFT TOTAL HIP ARTHROPLASTY ANTERIOR APPROACH;  Surgeon: Vernetta Lonni GRADE, MD;  Location: [facility] OR;  Service: Orthopedics;  Laterality: Left;    Current Outpatient Medications  Medication Sig Dispense Refill   acetaminophen  (TYLENOL ) 500 MG tablet Take 1 tablet (500 mg total) by mouth every 6 (six) hours as needed. 30 tablet 5   albuterol  (VENTOLIN  HFA) 108 (90 Base) MCG/ACT inhaler Inhale 2 puffs into the lungs every 6 (six) hours as needed for wheezing or shortness of breath. 8  g 0   amLODipine  (NORVASC ) 10 MG tablet TAKE 1 TABLET(10 MG) BY MOUTH DAILY 90 tablet 1   apixaban  (ELIQUIS ) 5 MG TABS tablet TAKE 1 TABLET(5 MG) BY MOUTH TWICE DAILY 180 tablet 1   cetirizine (ZYRTEC) 10 MG tablet Take 10 mg by mouth daily.     Cholecalciferol  (VITAMIN D ) 50 MCG (2000 UT) tablet Take 2,000 Units by mouth daily.     diphenhydrAMINE  (BENADRYL ) 25 MG tablet Take 25 mg by mouth at bedtime.     fluticasone  (FLONASE ) 50 MCG/ACT nasal spray Place 2 sprays into both nostrils daily as needed for allergies or rhinitis.     GLUCOSAMINE-CHONDROITIN PO Take 1 tablet by mouth daily.     guaiFENesin (MUCINEX) 600 MG 12 hr tablet Take 600 mg by mouth 2 (two) times daily as needed for to loosen phlegm or cough.     losartan -hydrochlorothiazide  (HYZAAR) 100-25 MG tablet TAKE 1 TABLET BY MOUTH DAILY 90 tablet 1   methocarbamol  (ROBAXIN ) 500 MG tablet Take 1 tablet (500 mg total) by mouth every 6 (six) hours as needed for muscle spasms. 30 tablet 1    metoprolol  succinate (TOPROL -XL) 25 MG 24 hr tablet TAKE 1 TABLET BY MOUTH DAILY 90 tablet 1   naphazoline-glycerin  (CLEAR EYES REDNESS) 0.012-0.25 % SOLN Place 1-2 drops into both eyes 4 (four) times daily as needed for eye irritation.     NON FORMULARY Pt uses c-pap nightly     Omega-3 Fatty Acids  (FISH OIL) 1200 MG CAPS Take 1,200 mg by mouth daily.     omeprazole  (PRILOSEC) 40 MG capsule TAKE 1 CAPSULE(40 MG) BY MOUTH DAILY 90 capsule 1   oxyCODONE  (OXY IR/ROXICODONE ) 5 MG immediate release tablet Take 1-2 tablets (5-10 mg total) by mouth every 6 (six) hours as needed for moderate pain (pain score 4-6) (pain score 4-6). 30 tablet 0   pravastatin  (PRAVACHOL ) 40 MG tablet TAKE 1 TABLET(40 MG) BY MOUTH DAILY 90 tablet 1   sildenafil  (REVATIO ) 20 MG tablet 1-2 tabs by mouth prior to sexual activity 30 tablet 5   tamsulosin  (FLOMAX ) 0.4 MG CAPS capsule TAKE 1 CAPSULE(0.4 MG) BY MOUTH DAILY 90 capsule 1   Turmeric Curcumin 500 MG CAPS Take 500 mg by mouth daily.     No current facility-administered medications for this encounter.    Allergies  Allergen Reactions   Sulfonamide Derivatives     Hallucination (Childhood Reaction)   ROS- All systems are reviewed and negative except as per the HPI above.  Physical Exam: Vitals:   08/05/23 0909  BP: 132/60  Pulse: (!) 52  Weight: 94.3 kg  Height: 5' 7 (1.702 m)    GEN- The patient is well appearing, alert and oriented x 3 today.   Neck - no JVD or carotid bruit noted Lungs- Clear to ausculation bilaterally, normal work of breathing Heart- Regular bradycardic rate and rhythm, no murmurs, rubs or gallops, PMI not laterally displaced Extremities- no clubbing, cyanosis, or edema Skin - no rash or ecchymosis noted   Wt Readings from Last 3 Encounters:  08/05/23 94.3 kg  07/08/23 93 kg  06/10/23 93 kg    EKG today demonstrates  Vent. rate 52 BPM PR interval 166 ms QRS duration 92 ms QT/QTcB 454/422 ms P-R-T axes 66 46 43 Sinus  bradycardia Otherwise normal ECG When compared with ECG of 08-Jul-2023 11:02, PREVIOUS ECG IS PRESENT  Echo 11/01/20 1. Left ventricular ejection fraction, by estimation, is 60 to 65%. The  left ventricle has  normal function. The left ventricle has no regional  wall motion abnormalities. Left ventricular diastolic parameters were  normal.   2. Right ventricular systolic function is normal. The right ventricular  size is normal.   3. The mitral valve is normal in structure. Trivial mitral valve  regurgitation. No evidence of mitral stenosis.   4. The aortic valve is normal in structure. Aortic valve regurgitation is  not visualized. No aortic stenosis is present.   5. The inferior vena cava is normal in size with greater than 50%  respiratory variability, suggesting right atrial pressure of 3 mmHg.   Epic records are reviewed at length today  CHA2DS2-VASc Score = 3  The patient's score is based upon: CHF History: 0 HTN History: 1 Diabetes History: 0 Stroke History: 0 Vascular Disease History: 1 Age Score: 1 Gender Score: 0      ASSESSMENT AND PLAN: 1. Paroxysmal Atrial Fibrillation (ICD10:  I48.0) The patient's CHA2DS2-VASc score is 3, indicating a 3.2% annual risk of stroke.   S/p afib ablation 02/08/22 S/p DCCV on 12/12/22 S/p Afib ablation on 07/08/23 by Dr. Inocencio.  He is currently in NSR. Continue Toprol  25 mg daily.   2. Secondary Hypercoagulable State (ICD10:  D68.69) The patient is at significant risk for stroke/thromboembolism based upon his CHA2DS2-VASc Score of 3.  Continue Apixaban  (Eliquis ).  No missed doses.   3. HTN Stable today.     Follow up as scheduled with EP.    Aaron Heinrich, PA-C [facility] [address] [place], [place] [address] [phone] 08/05/2023 9:27 AM

## 2023-08-19 ENCOUNTER — Other Ambulatory Visit: Payer: Self-pay | Admitting: Family

## 2023-08-22 ENCOUNTER — Other Ambulatory Visit (HOSPITAL_COMMUNITY): Payer: Self-pay

## 2023-08-22 DIAGNOSIS — I480 Paroxysmal atrial fibrillation: Secondary | ICD-10-CM

## 2023-08-22 MED ORDER — APIXABAN 5 MG PO TABS: 5.0000 mg | ORAL_TABLET | Freq: Two times a day (BID) | ORAL | Status: DC

## 2023-08-26 DIAGNOSIS — G4733 Obstructive sleep apnea (adult) (pediatric): Secondary | ICD-10-CM | POA: Diagnosis not present

## 2023-09-12 ENCOUNTER — Other Ambulatory Visit (HOSPITAL_COMMUNITY): Payer: Self-pay | Admitting: Internal Medicine

## 2023-09-25 DIAGNOSIS — M1711 Unilateral primary osteoarthritis, right knee: Secondary | ICD-10-CM | POA: Diagnosis not present

## 2023-09-25 DIAGNOSIS — M25561 Pain in right knee: Secondary | ICD-10-CM | POA: Diagnosis not present

## 2023-10-21 ENCOUNTER — Encounter: Payer: Self-pay | Admitting: Cardiology

## 2023-10-21 ENCOUNTER — Ambulatory Visit: Payer: Medicare Other | Attending: Cardiology | Admitting: Cardiology

## 2023-10-21 VITALS — BP 132/60 | HR 54 | Ht 67.0 in | Wt 211.0 lb

## 2023-10-21 DIAGNOSIS — I480 Paroxysmal atrial fibrillation: Secondary | ICD-10-CM

## 2023-10-21 DIAGNOSIS — I10 Essential (primary) hypertension: Secondary | ICD-10-CM | POA: Diagnosis not present

## 2023-10-21 DIAGNOSIS — D6869 Other thrombophilia: Secondary | ICD-10-CM | POA: Diagnosis not present

## 2023-10-21 NOTE — Progress Notes (Signed)
  Electrophysiology Office Note:   Date:  10/21/2023  ID:  Aaron Ruiz, DOB [birth year], MRN [id number]  Primary Cardiologist: [name] [name], DO Primary Heart Failure: None Electrophysiologist: Aaron Ruiz Aaron Ding, MD      History of Present Illness:   Aaron Ruiz is a 68 y.o. male with h/o fibrillation, hyperlipidemia, hypertension seen today for routine electrophysiology followup.   Since last being seen in our clinic the patient reports doing well.  He had 1 episode of atrial fibrillation for 3 to 5 minutes in March, but has had no further episodes.  He is happy with his control.  Able to do all of his daily activities without restriction.  he denies chest pain, palpitations, dyspnea, PND, orthopnea, nausea, vomiting, dizziness, syncope, edema, weight gain, or early satiety.   Review of systems complete and found to be negative unless listed in HPI.   EP Information / Studies Reviewed:    EKG is ordered today. Personal review as below.  EKG Interpretation Date/Time:  Tuesday October 21 2023 10:37:25 EDT Ventricular Rate:  54 PR Interval:  162 QRS Duration:  90 QT Interval:  440 QTC Calculation: 417 R Axis:   55  Text Interpretation: Sinus bradycardia When compared with ECG of 05-Aug-2023 09:11, No significant change was found Confirmed by Cyndia Degraff ([id number]) on 10/21/2023 11:02:46 AM     Risk Assessment/Calculations:    CHA2DS2-VASc Score = 3   This indicates a 3.2% annual risk of stroke. The patient's score is based upon: CHF History: 0 HTN History: 1 Diabetes History: 0 Stroke History: 0 Vascular Disease History: 1 Age Score: 1 Gender Score: 0             Physical Exam:   VS:  BP 132/60   Pulse (!) 54   Ht 5\' 7"  (1.702 m)   Wt 211 lb (95.7 kg)   SpO2 95%   BMI 33.05 kg/m    Wt Readings from Last 3 Encounters:  10/21/23 211 lb (95.7 kg)  08/05/23 207 lb 12.8 oz (94.3 kg)  07/08/23 205 lb (93 kg)     GEN: Well nourished, well developed in no acute  distress NECK: No JVD; No carotid bruits CARDIAC: Regular rate and rhythm, no murmurs, rubs, gallops RESPIRATORY:  Clear to auscultation without rales, wheezing or rhonchi  ABDOMEN: Soft, non-tender, non-distended EXTREMITIES:  No edema; No deformity   ASSESSMENT AND PLAN:    1.  Persistent atrial fibrillation: Currently on Eliquis  and amiodarone .  Post ablation 07/08/2023.  Has remained in sinus rhythm.  He has had 1 short episode of atrial fibrillation but no other recurrence.  He is happy with his control.  2.  Secondary hypercoagulable state: On Eliquis  for atrial fibrillation  3.  Hypertension: Well-controlled  4.  Obesity: Lifestyle modification encouraged  Follow up with Afib Clinic in 12 months  Signed, Halyn Flaugher Aaron Ding, MD

## 2023-11-12 ENCOUNTER — Ambulatory Visit: Payer: Medicare Other | Admitting: Orthopaedic Surgery

## 2023-11-12 ENCOUNTER — Other Ambulatory Visit (INDEPENDENT_AMBULATORY_CARE_PROVIDER_SITE_OTHER): Payer: Self-pay

## 2023-11-12 ENCOUNTER — Encounter: Payer: Self-pay | Admitting: Orthopaedic Surgery

## 2023-11-12 DIAGNOSIS — Z96642 Presence of left artificial hip joint: Secondary | ICD-10-CM

## 2023-11-12 NOTE — Progress Notes (Signed)
 The patient is now between 6 and 7 months status post a left total hip replacement.  He says he is doing great.  He is an active 68 year old gentleman.  His right hip moves smoothly and fluidly.  He has good gait and good range of motion and strength.  His leg lengths appear equal.  Standing AP pelvis and lateral of his left hip shows a well-seated total hip arthroplasty with no complicating features.  The right hip joint space only shows slight narrowing.  From my standpoint we will continue to increase his activities as comfort allows with no restrictions.  Will see him back for final visit in 6 months with a final AP pelvis standing.

## 2023-11-30 ENCOUNTER — Other Ambulatory Visit: Payer: Self-pay | Admitting: Family

## 2023-11-30 DIAGNOSIS — I10 Essential (primary) hypertension: Secondary | ICD-10-CM

## 2023-11-30 DIAGNOSIS — N401 Enlarged prostate with lower urinary tract symptoms: Secondary | ICD-10-CM

## 2023-12-01 ENCOUNTER — Encounter: Payer: Self-pay | Admitting: Cardiology

## 2023-12-01 DIAGNOSIS — G4733 Obstructive sleep apnea (adult) (pediatric): Secondary | ICD-10-CM | POA: Diagnosis not present

## 2023-12-02 NOTE — Telephone Encounter (Signed)
 Please review and advise.

## 2023-12-05 ENCOUNTER — Telehealth (HOSPITAL_COMMUNITY): Payer: Self-pay

## 2023-12-05 NOTE — Telephone Encounter (Signed)
 Pt made aware that medication vs repeat ablation are his options.  He is waiting on afib clinic to call him and arrange appt to discuss tx plan further.  I have held 9/5 procedure date (soonest available) in case decision is to try another ablation. Will send him held ablation date via mychart as discussed. Will follow up after he sees afib clinic.   Patient agreeable to plan.

## 2023-12-05 NOTE — Telephone Encounter (Signed)
 Patient called stating he sent a mychart to [facility] and haven't heard anything I open the Kardia mobile strips. Had Aaron Ruiz to verify Afib he stated that if the episodes are lasting longer we will bring him in for a visit.

## 2023-12-16 ENCOUNTER — Ambulatory Visit (HOSPITAL_COMMUNITY)
Admission: RE | Admit: 2023-12-16 | Discharge: 2023-12-16 | Disposition: A | Discharge status: Home / Self Care | Source: Ambulatory Visit | Attending: Internal Medicine | Admitting: Internal Medicine

## 2023-12-16 VITALS — BP 116/50 | HR 56 | Ht 67.0 in | Wt 209.0 lb

## 2023-12-16 DIAGNOSIS — I4891 Unspecified atrial fibrillation: Secondary | ICD-10-CM

## 2023-12-16 DIAGNOSIS — I480 Paroxysmal atrial fibrillation: Secondary | ICD-10-CM

## 2023-12-16 DIAGNOSIS — D6869 Other thrombophilia: Secondary | ICD-10-CM | POA: Diagnosis not present

## 2023-12-16 MED ORDER — MULTAQ 400 MG PO TABS: 400.0000 mg | ORAL_TABLET | Freq: Two times a day (BID) | ORAL | 2 refills | Status: DC

## 2023-12-16 NOTE — Progress Notes (Signed)
 Primary Care Physician: Dorrene Gaucher, NP Primary Cardiologist: Dr Emmette Harms Primary Electrophysiologist: Dr Lawana Pray Referring Physician: [facility]   Aaron Ruiz is a 68 y.o. male with a history of HTN, HLD, atrial fibrillation who presents for follow up in the [facility]. The patient was initially diagnosed with atrial fibrillation 10/03/20 after presenting to [facility] ED with symptoms of tachypalpitations. He had just seen Dr Emmette Harms who placed a cardiac monitor to screen for arrhythmias. Patient was started on Eliquis  for a CHADS2VASC score of 3. He was recently taken off of metoprolol  due to bradycardia, this was resumed at the ED. He denies any significant alcohol use but does admit to snoring and some daytime somnolence. Patient was seen by Dr Lawana Pray and started on flecainide . He eventually underwent afib ablation 02/08/22.   On follow up today, patient reports that he has done well since his ablation with no episodes of afib. He denies CP, swallowing pain, or groin issues. No bleeding issues on anticoagulation.   On follow up 12/09/22, patient is currently in atrial flutter. His ECG is abnormal and shows wide QRS complexes. He reports to be out of rhythm since this past Friday. He feels lightheaded and a little SOB with minimal exertion. He notes previously episodes were always brief and would convert to NSR quickly; over past couple of months episodes have seemed to last longer. Flecainide  increased to 150 mg BID on 6/3 per phone notes. He has not missed any doses of Eliquis .  On follow up 12/16/22, he is currently in atrial flutter. S/p successful DCCV on 6/13 with ERAF. He called on 6/14 noting HR 120-130s. Amlodipine  held and taking extra Toprol  for total of 50 mg daily.   On follow up 08/05/23, patient is currently in NSR. S/p Afib ablation on 07/08/23 by Dr. Lawana Pray. No episodes of Afib since ablation. He is doing great. He is walking  frequently and going to the gym. No chest pain or SOB. Leg sites healed without issue. No missed doses of Eliquis  5 mg BID.  On follow up 12/16/23, patient is currently in NSR. Patient contacted office noting increasing episodes of Afib and is tentatively scheduled for repeat ablation in September. He has noted 4 brief episodes of Afib in May and 4 so far in June. They last several minutes and he does not feel poorly during episodes. No missed doses of Eliquis .   Today, he denies symptoms of orthopnea, PND, lower extremity edema, dizziness, presyncope, syncope, snoring, daytime somnolence, bleeding, or neurologic sequela. The patient is tolerating medications without difficulties and is otherwise without complaint today.    Atrial Fibrillation Risk Factors:  he does have symptoms or diagnosis of sleep apnea. Patient declined sleep study. he does not have a history of rheumatic fever. he does not have a history of alcohol use. The patient does have a history of early familial atrial fibrillation or other arrhythmias. Sister has afib s/p ablation.  he has a BMI of Body mass index is 32.73 kg/m.Aaron Aas Filed Weights   12/16/23 0927  Weight: 94.8 kg      Family History  Problem Relation Age of Onset   Coronary artery disease Father        CABG X5   Diabetes Father    Colon cancer Maternal Aunt    Cancer Brother        lung (smoker)   Hypertension Other    Kidney disease Other     Atrial  Fibrillation Management history:  Previous antiarrhythmic drugs: flecainide   Previous cardioversions: none Previous ablations: 02/08/22; 07/08/23 CHADS2VASC score: 2 Anticoagulation history: Eliquis    Past Medical History:  Diagnosis Date   Actinic keratosis 03/20/2009   Qualifier: Diagnosis of  By: Trudi Fus DO, D. Robert    Allergic conjunctivitis 06/30/2018   Anemia    ANEMIA-NOS 02/06/2009   Qualifier: Diagnosis of  By: Sanford Crumble CMA, [name]     Asthma    uses inhaler   Basal cell carcinoma 2017    left side of nose   Basal cell carcinoma of nose 10/16/2020   Borderline diabetes 05/03/2011   BPH (benign prostatic hyperplasia) 07/29/2014   Chemosis of conjunctiva of both eyes 01/15/2017   Colon polyps    Daytime somnolence 09/21/2020   Diverticulosis    Dysrhythmia    Essential hypertension 02/06/2009   Qualifier: Diagnosis of  By: Sanford Crumble CMA, [name]      Fatigue 09/21/2020   Former smoker 09/21/2020   General medical examination 11/14/2010   GERD 02/06/2009   Qualifier: Diagnosis of  By: Sanford Crumble CMA, [name]     GERD (gastroesophageal reflux disease)    on meds   High cholesterol 02/06/2009   Qualifier: Diagnosis of  By: Sanford Crumble CMA, [name]     Hx of adenomatous colonic polyps 03/07/2009   Hx of eye surgery 2018   Left Eye was swollen and had procedure to remove excess swelling   Hyperglycemia 05/01/2011   Hyperlipidemia    Hypertension    Hypogonadism male 04/30/2011   Lactose intolerance    Lentigo 10/16/2020   Mild intermittent asthma 06/30/2018   Neoplasm of uncertain behavior of skin 10/16/2020   Obesity (BMI 30-39.9) 09/21/2020   OSA (obstructive sleep apnea) 11/22/2022   Osteoarthritis 09/24/2011   OVERWEIGHT 02/06/2009   Qualifier: Diagnosis of  By: Marthe Slain    Palpitations 09/21/2020   Paroxysmal atrial fibrillation (HCC) 10/06/2020   Rash/urticaria 08/07/2012   Recurrent UTI 07/30/2013   Routine general medical examination at a health care facility 12/16/2011   Secondary hypercoagulable state (HCC) 10/06/2020   SKIN LESION 03/20/2009   Qualifier: Diagnosis of  By: Marthe Slain    Snoring 09/21/2020   Unspecified asthma(493.90) 02/06/2009   Centricity Description: ASTHMA Qualifier: Diagnosis of  By: Sanford Crumble CMA, [name]   Centricity Description: ASTHMA, INTERMITTENT, MILD Qualifier: Diagnosis of  By: Fleming Hum FNP, [name] S    URI (upper respiratory infection) 07/23/2011   Vitamin D  deficiency 09/21/2020   Past Surgical History:   Procedure Laterality Date   ATRIAL FIBRILLATION ABLATION N/A 02/08/2022   Procedure: ATRIAL FIBRILLATION ABLATION;  Surgeon: Lei Pump, MD;  Location: [facility];  Service: Cardiovascular;  Laterality: N/A;   ATRIAL FIBRILLATION ABLATION N/A 07/08/2023   Procedure: ATRIAL FIBRILLATION ABLATION;  Surgeon: Lei Pump, MD;  Location: [facility] CV [facility];  Service: Cardiovascular;  Laterality: N/A;   CARDIOVERSION N/A 12/12/2022   Procedure: CARDIOVERSION;  Surgeon: Jacqueline Matsu, MD;  Location: [facility]VAS[facility] CV LAB;  Service: Cardiovascular;  Laterality: N/A;   CYSTECTOMY  05/2004   removed fromback /sebaceous cyst   INGUINAL HERNIA REPAIR  1995   left   mohs procedure  2017   left side of nose   NASAL SEPTUM SURGERY     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   Procedure: LEFT TOTAL HIP ARTHROPLASTY ANTERIOR APPROACH;  Surgeon: Arnie Lao, MD;  Location: [facility] OR;  Service: Orthopedics;  Laterality: Left;  Current Outpatient Medications  Medication Sig Dispense Refill   acetaminophen  (TYLENOL ) 500 MG tablet Take 1 tablet (500 mg total) by mouth every 6 (six) hours as needed. (Patient taking differently: Take 500 mg by mouth as needed.) 30 tablet 5   albuterol  (VENTOLIN  HFA) 108 (90 Base) MCG/ACT inhaler Inhale 2 puffs into the lungs every 6 (six) hours as needed for wheezing or shortness of breath. 8 g 0   amLODipine  (NORVASC ) 10 MG tablet TAKE 1 TABLET(10 MG) BY MOUTH DAILY 90 tablet 1   apixaban  (ELIQUIS ) 5 MG TABS tablet Take 1 tablet (5 mg total) by mouth 2 (two) times daily. 56 tablet    Ascorbic Acid (VITAMIN C PO) Taking emergen vitamin C - mix in water- He takes daily     cetirizine (ZYRTEC) 10 MG tablet Take 10 mg by mouth daily.     Cholecalciferol  (VITAMIN D ) 50 MCG (2000 UT) tablet Take 2,000 Units by mouth daily.     diphenhydrAMINE  (BENADRYL ) 25 MG tablet Take 25 mg by mouth at bedtime.     dronedarone (MULTAQ) 400 MG tablet Take 1 tablet  (400 mg total) by mouth 2 (two) times daily with a meal. 60 tablet 2   fluticasone  (FLONASE ) 50 MCG/ACT nasal spray Place 2 sprays into both nostrils daily as needed for allergies or rhinitis.     GLUCOSAMINE-CHONDROITIN PO Take 1 tablet by mouth daily.     guaiFENesin (MUCINEX) 600 MG 12 hr tablet Take 600 mg by mouth daily as needed for to loosen phlegm or cough.     losartan -hydrochlorothiazide  (HYZAAR) 100-25 MG tablet TAKE 1 TABLET BY MOUTH DAILY 90 tablet 0   methocarbamol  (ROBAXIN ) 500 MG tablet Take 1 tablet (500 mg total) by mouth every 6 (six) hours as needed for muscle spasms. (Patient taking differently: Take 500 mg by mouth as needed for muscle spasms.) 30 tablet 1   metoprolol  succinate (TOPROL -XL) 25 MG 24 hr tablet TAKE 1 TABLET BY MOUTH DAILY 90 tablet 0   naphazoline-glycerin  (CLEAR EYES REDNESS) 0.012-0.25 % SOLN Place 1-2 drops into both eyes 4 (four) times daily as needed for eye irritation.     NON FORMULARY Pt uses c-pap nightly     Omega-3 Fatty Acids  (FISH OIL) 1200 MG CAPS Take 1,200 mg by mouth daily.     omeprazole  (PRILOSEC) 40 MG capsule TAKE 1 CAPSULE(40 MG) BY MOUTH DAILY 90 capsule 1   oxyCODONE  (OXY IR/ROXICODONE ) 5 MG immediate release tablet Take 1-2 tablets (5-10 mg total) by mouth every 6 (six) hours as needed for moderate pain (pain score 4-6) (pain score 4-6). (Patient taking differently: Take 5-10 mg by mouth as needed for moderate pain (pain score 4-6) (pain score 4-6).) 30 tablet 0   pravastatin  (PRAVACHOL ) 40 MG tablet TAKE 1 TABLET(40 MG) BY MOUTH DAILY 90 tablet 1   sildenafil  (REVATIO ) 20 MG tablet 1-2 tabs by mouth prior to sexual activity 30 tablet 5   tamsulosin  (FLOMAX ) 0.4 MG CAPS capsule TAKE 1 CAPSULE(0.4 MG) BY MOUTH DAILY 90 capsule 0   Turmeric Curcumin 500 MG CAPS Take 500 mg by mouth daily.     No current facility-administered medications for this encounter.    Allergies  Allergen Reactions   Sulfonamide Derivatives     Hallucination  (Childhood Reaction)   ROS- All systems are reviewed and negative except as per the HPI above.  Physical Exam: Vitals:   12/16/23 0927  BP: (!) 116/50  Pulse: (!) 56  Weight: 94.8 kg  Height: 5' 7 (1.702 m)    GEN- The patient is well appearing, alert and oriented x 3 today.   Neck - no JVD or carotid bruit noted Lungs- Clear to ausculation bilaterally, normal work of breathing Heart- Regular bradycardic rate and rhythm, no murmurs, rubs or gallops, PMI not laterally displaced Extremities- no clubbing, cyanosis, or edema Skin - no rash or ecchymosis noted   Wt Readings from Last 3 Encounters:  12/16/23 94.8 kg  10/21/23 95.7 kg  08/05/23 94.3 kg    EKG today demonstrates  Vent. rate 56 BPM PR interval 160 ms QRS duration 86 ms QT/QTcB 444/428 ms P-R-T axes 61 18 24 Sinus bradycardia with sinus arrhythmia Otherwise normal ECG When compared with ECG of 21-Oct-2023 10:37, No significant change was found  Echo 11/01/20 1. Left ventricular ejection fraction, by estimation, is 60 to 65%. The  left ventricle has normal function. The left ventricle has no regional  wall motion abnormalities. Left ventricular diastolic parameters were  normal.   2. Right ventricular systolic function is normal. The right ventricular  size is normal.   3. The mitral valve is normal in structure. Trivial mitral valve  regurgitation. No evidence of mitral stenosis.   4. The aortic valve is normal in structure. Aortic valve regurgitation is  not visualized. No aortic stenosis is present.   5. The inferior vena cava is normal in size with greater than 50%  respiratory variability, suggesting right atrial pressure of 3 mmHg.   Epic records are reviewed at length today  CHA2DS2-VASc Score = 3  The patient's score is based upon: CHF History: 0 HTN History: 1 Diabetes History: 0 Stroke History: 0 Vascular Disease History: 1 Age Score: 1 Gender Score: 0      ASSESSMENT AND PLAN: 1.  Paroxysmal Atrial Fibrillation (ICD10:  I48.0) The patient's CHA2DS2-VASc score is 3, indicating a 3.2% annual risk of stroke.   S/p afib ablation 02/08/22 S/p DCCV on 12/12/22 S/p Afib ablation on 07/08/23 by Dr. Lawana Pray.  He is currently in NSR. We discussed options for treatment which include Multaq, amiodarone , and repeat ablation. He would like to continue watchful waiting for now with conservative observation. He ask that we send Multaq to pharmacy so he can check on price. If he notices more Afib / worsening burden, will contact clinic to confirm start taking Multaq and we will schedule repeat ECG visit.    2. Secondary Hypercoagulable State (ICD10:  D68.69) The patient is at significant risk for stroke/thromboembolism based upon his CHA2DS2-VASc Score of 3.  Continue Apixaban  (Eliquis ).  No missed doses.   3. HTN Stable today.    Follow up as scheduled with EP.    Minnie Amber, PA-C [facility] [address] [place], [place] [address] [phone] 12/16/2023 12:03 PM

## 2023-12-16 NOTE — Patient Instructions (Signed)
 Call our office if you want to stay with Multaq   Multaq 400 mg twice a day with food

## 2023-12-22 ENCOUNTER — Telehealth (HOSPITAL_COMMUNITY): Payer: Self-pay | Admitting: *Deleted

## 2023-12-22 MED ORDER — AMIODARONE HCL 200 MG PO TABS: ORAL_TABLET | ORAL | 3 refills | Status: DC

## 2023-12-22 NOTE — Telephone Encounter (Signed)
 Pt called requested to start amiodarone  after thinking about options that was given on his last appt. I verified no missed doses of blood thinner which  he denied. I discussed with Thom Heinrich P.A. and agrees to order Amiodarone  200 mg  bid x 30 days then 200 mg daily. Pt verbalized understanding. Follow up appt made in 3 weeks.

## 2024-01-01 ENCOUNTER — Other Ambulatory Visit: Payer: Self-pay

## 2024-01-01 DIAGNOSIS — E782 Mixed hyperlipidemia: Secondary | ICD-10-CM

## 2024-01-01 MED ORDER — PRAVASTATIN SODIUM 40 MG PO TABS: ORAL_TABLET | ORAL | 1 refills | Status: DC

## 2024-01-08 ENCOUNTER — Ambulatory Visit (HOSPITAL_COMMUNITY)
Admission: RE | Admit: 2024-01-08 | Discharge: 2024-01-08 | Disposition: A | Discharge status: Home / Self Care | Source: Ambulatory Visit | Attending: Internal Medicine | Admitting: Internal Medicine

## 2024-01-08 VITALS — HR 54

## 2024-01-08 DIAGNOSIS — I4891 Unspecified atrial fibrillation: Secondary | ICD-10-CM | POA: Diagnosis not present

## 2024-01-08 NOTE — Progress Notes (Signed)
 Patient is here for ECG recheck after starting amiodarone .   ECG today shows  Vent. rate 54 BPM PR interval 170 ms QRS duration 90 ms QT/QTcB 460/436 ms P-R-T axes 67 34 32 Sinus bradycardia Otherwise normal ECG When compared with ECG of 16-Dec-2023 09:37, No significant change was found   Patient will transition to amiodarone  200 mg once daily in 2 weeks; okay to continue amiodarone  as bridge to ablation. Patient will follow up as scheduled for ablation.

## 2024-01-13 ENCOUNTER — Telehealth: Payer: Self-pay

## 2024-01-13 DIAGNOSIS — H524 Presbyopia: Secondary | ICD-10-CM | POA: Diagnosis not present

## 2024-01-13 DIAGNOSIS — I480 Paroxysmal atrial fibrillation: Secondary | ICD-10-CM

## 2024-01-13 NOTE — Telephone Encounter (Signed)
 Called pt and confirmed he is scheduled for Afib Ablation with Dr. Inocencio on 9/5 at 12:30 PM.   I scheduled his CT Scan on 8/22 and he will have labs same day. He will be out of town first of August.   I will send Instruction letters via MyChart and mail.

## 2024-01-16 ENCOUNTER — Other Ambulatory Visit: Payer: Self-pay | Admitting: Cardiology

## 2024-01-16 ENCOUNTER — Other Ambulatory Visit: Payer: Self-pay | Admitting: Family

## 2024-01-16 DIAGNOSIS — I480 Paroxysmal atrial fibrillation: Secondary | ICD-10-CM

## 2024-01-16 DIAGNOSIS — I10 Essential (primary) hypertension: Secondary | ICD-10-CM

## 2024-01-16 NOTE — Telephone Encounter (Signed)
 Prescription refill request for Eliquis  received. Indication:afib Last office visit:6/25 Scr:1.03  12/24 Age: 68 Weight:94.8  kg  Prescription refilled

## 2024-01-16 NOTE — Telephone Encounter (Signed)
Please call pt to schedule follow up visit. 

## 2024-01-29 ENCOUNTER — Encounter: Payer: Self-pay | Admitting: Pharmacist

## 2024-01-29 NOTE — Progress Notes (Signed)
 Pharmacy Quality Measure Review  This patient is appearing on a report for being at risk of failing the adherence measure for hypertension (ACEi/ARB) medications this calendar year.   Medication: losartan  HCTZ Last fill date: 09/01/2023 for 90 day supply per adherence report  Reviewed recent refill history in Dr Annemarie database. Actual last refill date was 11/30/2023 for 90 day supply. Patient has no refills remaining. Next appointment with PCP is 01/30/2024 for medication review / refills  Madelin Ray, PharmD Clinical Pharmacist [facility]  Population Health [phone]

## 2024-01-30 ENCOUNTER — Ambulatory Visit: Admitting: Family

## 2024-01-30 VITALS — BP 128/49 | HR 58 | Temp 98.1°F | Resp 16 | Ht 67.0 in | Wt 210.0 lb

## 2024-01-30 DIAGNOSIS — K219 Gastro-esophageal reflux disease without esophagitis: Secondary | ICD-10-CM

## 2024-01-30 DIAGNOSIS — Z85828 Personal history of other malignant neoplasm of skin: Secondary | ICD-10-CM

## 2024-01-30 DIAGNOSIS — R7303 Prediabetes: Secondary | ICD-10-CM | POA: Diagnosis not present

## 2024-01-30 DIAGNOSIS — G4733 Obstructive sleep apnea (adult) (pediatric): Secondary | ICD-10-CM | POA: Diagnosis not present

## 2024-01-30 DIAGNOSIS — E669 Obesity, unspecified: Secondary | ICD-10-CM | POA: Diagnosis not present

## 2024-01-30 DIAGNOSIS — E559 Vitamin D deficiency, unspecified: Secondary | ICD-10-CM

## 2024-01-30 DIAGNOSIS — I480 Paroxysmal atrial fibrillation: Secondary | ICD-10-CM

## 2024-01-30 DIAGNOSIS — N401 Enlarged prostate with lower urinary tract symptoms: Secondary | ICD-10-CM | POA: Diagnosis not present

## 2024-01-30 DIAGNOSIS — H9319 Tinnitus, unspecified ear: Secondary | ICD-10-CM

## 2024-01-30 DIAGNOSIS — E782 Mixed hyperlipidemia: Secondary | ICD-10-CM

## 2024-01-30 DIAGNOSIS — D649 Anemia, unspecified: Secondary | ICD-10-CM

## 2024-01-30 DIAGNOSIS — I10 Essential (primary) hypertension: Secondary | ICD-10-CM

## 2024-01-30 DIAGNOSIS — D6869 Other thrombophilia: Secondary | ICD-10-CM

## 2024-01-30 DIAGNOSIS — H1013 Acute atopic conjunctivitis, bilateral: Secondary | ICD-10-CM

## 2024-01-30 DIAGNOSIS — E291 Testicular hypofunction: Secondary | ICD-10-CM

## 2024-01-30 DIAGNOSIS — J4520 Mild intermittent asthma, uncomplicated: Secondary | ICD-10-CM

## 2024-01-30 LAB — LIPID PANEL
Cholesterol: 157 mg/dL (ref 0–200)
HDL: 40.4 mg/dL (ref >39.00)
LDL Cholesterol: 69 mg/dL (ref 0–99)
NonHDL: 117.03
Total CHOL/HDL Ratio: 4
Triglycerides: 240 mg/dL — ABNORMAL HIGH (ref 0.0–149.0)
VLDL: 48 mg/dL — ABNORMAL HIGH (ref 0.0–40.0)

## 2024-01-30 LAB — HEMOGLOBIN A1C: Hgb A1c MFr Bld: 6.4 % (ref 4.6–6.5)

## 2024-01-30 LAB — PSA: PSA: 0.73 ng/mL (ref 0.10–4.00)

## 2024-01-30 NOTE — Assessment & Plan Note (Signed)
On CPAP.  Good compliance.

## 2024-01-30 NOTE — Progress Notes (Signed)
 l  Subjective:     Patient ID: Aaron Ruiz, male    DOB: [birth year], 68 y.o.   MRN: [id number]  Chief Complaint  Patient presents with   Hypertension    Here for follo wup   Tinnitus    Follow up    HPI  Discussed the use of AI scribe software for clinical note transcription with the patient, who gave verbal consent to proceed.  History of Present Illness  Aaron Ruiz is a 68 year old male with atrial fibrillation and asthma who presents for follow-up regarding his upcoming cardiac ablation and medication management. He is accompanied by his wife.  He is scheduled for his third cardiac ablation on February 20, 2024, due to a recurrence of symptoms after a successful ablation in January 2025. He is currently on amiodarone , which is not a long-term option. A CT scan and blood work are planned for the day of the ablation.  He uses a CPAP machine, which has improved his sleep quality, though he still experiences daytime drowsiness, particularly when inactive. He struggles to stay awake while reading but feels alert when active.  He experiences occasional asthma symptoms and uses an albuterol  inhaler as needed, with no recent use in the past several days.  His current medications include amlodipine  10 mg, losartan -hydrochlorothiazide  100-25 mg, toprol  25 mg, omeprazole , and Flomax . He uses Zyrtec and Clinisol for allergies as needed. He has discontinued testosterone  therapy and is doing well without it. He takes vitamin D  2000 units intermittently.  He is maintaining a healthy diet, avoiding sugar, and has joined the [facility] to exercise five days a week, though recent visits have been limited due to family commitments.     Health Maintenance Due  Topic Date Due   INFLUENZA VACCINE  01/30/2024    Past Medical History:  Diagnosis Date   Actinic keratosis 03/20/2009   Qualifier: Diagnosis of  By: Georgian DO, D. Robert    Allergic conjunctivitis 06/30/2018   Anemia     ANEMIA-NOS 02/06/2009   Qualifier: Diagnosis of  By: Anice CMA, [name]     Asthma    uses inhaler   Basal cell carcinoma 2017   left side of nose   Basal cell carcinoma of nose 10/16/2020   Borderline diabetes 05/03/2011   BPH (benign prostatic hyperplasia) 07/29/2014   Chemosis of conjunctiva of both eyes 01/15/2017   Colon polyps    Daytime somnolence 09/21/2020   Diverticulosis    Dysrhythmia    Essential hypertension 02/06/2009   Qualifier: Diagnosis of  By: Anice CMA, [name]      Fatigue 09/21/2020   Former smoker 09/21/2020   General medical examination 11/14/2010   GERD 02/06/2009   Qualifier: Diagnosis of  By: Anice CMA, [name]     GERD (gastroesophageal reflux disease)    on meds   High cholesterol 02/06/2009   Qualifier: Diagnosis of  By: Anice CMA, [name]     Hx of adenomatous colonic polyps 03/07/2009   Hx of eye surgery 2018   Left Eye was swollen and had procedure to remove excess swelling   Hyperglycemia 05/01/2011   Hyperlipidemia    Hypertension    Hypogonadism male 04/30/2011   Lactose intolerance    Lentigo 10/16/2020   Mild intermittent asthma 06/30/2018   Neoplasm of uncertain behavior of skin 10/16/2020   Obesity (BMI 30-39.9) 09/21/2020   OSA (obstructive sleep apnea) 11/22/2022   Osteoarthritis 09/24/2011   OVERWEIGHT 02/06/2009   Qualifier: Diagnosis  of  By: [name]    Palpitations 09/21/2020   Paroxysmal atrial fibrillation (HCC) 10/06/2020   Rash/urticaria 08/07/2012   Recurrent UTI 07/30/2013   Routine general medical examination at a health care facility 12/16/2011   Secondary hypercoagulable state (HCC) 10/06/2020   SKIN LESION 03/20/2009   Qualifier: Diagnosis of  By: [name]    Snoring 09/21/2020   Unspecified asthma(493.90) 02/06/2009   Centricity Description: ASTHMA Qualifier: Diagnosis of  By: Anice CMA, [name]   Centricity Description: ASTHMA, INTERMITTENT, MILD Qualifier: Diagnosis of  By:  Daryl FNP, Amiel Sharrow S    URI (upper respiratory infection) 07/23/2011   Vitamin D  deficiency 09/21/2020    Past Surgical History:  Procedure Laterality Date   ATRIAL FIBRILLATION ABLATION N/A 02/08/2022   Procedure: ATRIAL FIBRILLATION ABLATION;  Surgeon: Inocencio Soyla Lunger, MD;  Location: [facility];  Service: Cardiovascular;  Laterality: N/A;   ATRIAL FIBRILLATION ABLATION N/A 07/08/2023   Procedure: ATRIAL FIBRILLATION ABLATION;  Surgeon: Inocencio Soyla Lunger, MD;  Location: [facility];  Service: Cardiovascular;  Laterality: N/A;   CARDIOVERSION N/A 12/12/2022   Procedure: CARDIOVERSION;  Surgeon: Shlomo Wilbert SAUNDERS, MD;  Location: [facility];  Service: Cardiovascular;  Laterality: N/A;   CYSTECTOMY  05/2004   removed fromback /sebaceous cyst   INGUINAL HERNIA REPAIR  1995   left   mohs procedure  2017   left side of nose   NASAL SEPTUM SURGERY     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   Procedure: LEFT TOTAL HIP ARTHROPLASTY ANTERIOR APPROACH;  Surgeon: Vernetta Lonni GRADE, MD;  Location: [facility] OR;  Service: Orthopedics;  Laterality: Left;    Family History  Problem Relation Age of Onset   Coronary artery disease Father        CABG X5   Diabetes Father    Colon cancer Maternal Aunt    Cancer Brother        lung (smoker)   Hypertension Other    Kidney disease Other     Social History   Socioeconomic History   Marital status: Married    Spouse name: Not on file   Number of children: Not on file   Years of education: Not on file   Highest education level: Some college, no degree  Occupational History   Not on file  Tobacco Use   Smoking status: Former    Current packs/day: 0.00    Types: Cigarettes    Start date: 07/01/1969    Quit date: 07/01/1974    Years since quitting: 49.6   Smokeless tobacco: Never   Tobacco comments:    Former smoker 03/08/22  Vaping Use   Vaping status: Never Used  Substance and Sexual Activity   Alcohol use: No     Alcohol/week: 0.0 standard drinks of alcohol   Drug use: No   Sexual activity: Not on file  Other Topics Concern   Not on file  Social History Narrative   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[employer])   Married 33 years    1  daughter 44 - Hazen (lives in [place])   1  son 85    Retired   Alcohol use-no   Smoking Status:  quit   Caffeine use/day:  2-3 beverages daily   Social Drivers of Corporate investment banker Strain: Low Risk  (01/23/2024)   Overall Financial Resource Strain (CARDIA)    Difficulty of Paying Living Expenses: Not hard at all  Food Insecurity: No Food Insecurity (01/23/2024)   Hunger Vital Sign    Worried About Running Out of Food in the Last Year: Never true    Ran Out of Food in the Last Year: Never true  Transportation Needs: No Transportation Needs (01/23/2024)   PRAPARE - Administrator, Civil Service (Medical): No    Lack of Transportation (Non-Medical): No  Physical Activity: Sufficiently Active (01/23/2024)   Exercise Vital Sign    Days of Exercise per Week: 5 days    Minutes of Exercise per Session: 90 min  Stress: No Stress Concern Present (01/23/2024)   [employer] of Occupational Health - Occupational Stress Questionnaire    Feeling of Stress: Not at all  Social Connections: Socially Integrated (01/23/2024)   Social Connection and Isolation Panel    Frequency of Communication with Friends and Family: Three times a week    Frequency of Social Gatherings with Friends and Family: Once a week    Attends Religious Services: More than 4 times per year    Active Member of [employer] or Organizations: Yes    Attends Engineer, structural: More than 4 times per year    Marital Status: Married  Catering manager Violence: Not At Risk (06/10/2023)   Humiliation, Afraid, Rape, and Kick questionnaire    Fear of Current or Ex-Partner: No    Emotionally Abused: No    Physically Abused: No    Sexually Abused: No    Outpatient Medications  Prior to Visit  Medication Sig Dispense Refill   acetaminophen  (TYLENOL ) 500 MG tablet Take 1 tablet (500 mg total) by mouth every 6 (six) hours as needed. (Patient taking differently: Take 500 mg by mouth as needed.) 30 tablet 5   albuterol  (VENTOLIN  HFA) 108 (90 Base) MCG/ACT inhaler Inhale 2 puffs into the lungs every 6 (six) hours as needed for wheezing or shortness of breath. 8 g 0   amiodarone  (PACERONE ) 200 MG tablet Take 1 tablet (200 mg total) by mouth 2 (two) times daily for 30 days, THEN 1 tablet (200 mg total) daily. 60 tablet 3   amLODipine  (NORVASC ) 10 MG tablet TAKE 1 TABLET(10 MG) BY MOUTH DAILY 30 tablet 0   apixaban  (ELIQUIS ) 5 MG TABS tablet TAKE 1 TABLET(5 MG) BY MOUTH TWICE DAILY 180 tablet 1   Ascorbic Acid (VITAMIN C PO) Taking emergen vitamin C - mix in water- He takes daily     cetirizine (ZYRTEC) 10 MG tablet Take 10 mg by mouth daily.     Cholecalciferol  (VITAMIN D ) 50 MCG (2000 UT) tablet Take 2,000 Units by mouth daily.     diphenhydrAMINE  (BENADRYL ) 25 MG tablet Take 25 mg by mouth at bedtime.     fluticasone  (FLONASE ) 50 MCG/ACT nasal spray Place 2 sprays into both nostrils daily as needed for allergies or rhinitis.     GLUCOSAMINE-CHONDROITIN PO Take 1 tablet by mouth daily.     guaiFENesin (MUCINEX) 600 MG 12 hr tablet Take 600 mg by mouth daily as needed for to loosen phlegm or cough.     losartan -hydrochlorothiazide  (HYZAAR) 100-25 MG tablet TAKE 1 TABLET BY MOUTH DAILY 90 tablet 0   metoprolol  succinate (TOPROL -XL) 25 MG 24 hr tablet TAKE 1 TABLET BY MOUTH DAILY 90 tablet 0   naphazoline-glycerin  (CLEAR EYES REDNESS) 0.012-0.25 % SOLN Place 1-2 drops into both eyes 4 (four) times daily as needed for eye irritation.     NON FORMULARY Pt uses c-pap nightly  Omega-3 Fatty Acids  (FISH OIL) 1200 MG CAPS Take 1,200 mg by mouth daily.     omeprazole  (PRILOSEC) 40 MG capsule TAKE 1 CAPSULE(40 MG) BY MOUTH DAILY 90 capsule 1   pravastatin  (PRAVACHOL ) 40 MG tablet  TAKE 1 TABLET(40 MG) BY MOUTH DAILY 90 tablet 1   sildenafil  (REVATIO ) 20 MG tablet 1-2 tabs by mouth prior to sexual activity 30 tablet 5   tamsulosin  (FLOMAX ) 0.4 MG CAPS capsule TAKE 1 CAPSULE(0.4 MG) BY MOUTH DAILY 90 capsule 0   Turmeric Curcumin 500 MG CAPS Take 500 mg by mouth daily.     methocarbamol  (ROBAXIN ) 500 MG tablet Take 1 tablet (500 mg total) by mouth every 6 (six) hours as needed for muscle spasms. (Patient taking differently: Take 500 mg by mouth as needed for muscle spasms.) 30 tablet 1   oxyCODONE  (OXY IR/ROXICODONE ) 5 MG immediate release tablet Take 1-2 tablets (5-10 mg total) by mouth every 6 (six) hours as needed for moderate pain (pain score 4-6) (pain score 4-6). (Patient taking differently: Take 5-10 mg by mouth as needed for moderate pain (pain score 4-6) (pain score 4-6).) 30 tablet 0   No facility-administered medications prior to visit.    Allergies  Allergen Reactions   Sulfonamide Derivatives     Hallucination (Childhood Reaction)    ROS See HPI    Objective:    Physical Exam Constitutional:      General: He is not in acute distress.    Appearance: He is well-developed.  HENT:     Head: Normocephalic and atraumatic.  Cardiovascular:     Rate and Rhythm: Normal rate. Rhythm irregular.     Heart sounds: No murmur heard. Pulmonary:     Effort: Pulmonary effort is normal. No respiratory distress.     Breath sounds: Normal breath sounds. No wheezing or rales.  Skin:    General: Skin is warm and dry.  Neurological:     Mental Status: He is alert and oriented to person, place, and time.  Psychiatric:        Behavior: Behavior normal.        Thought Content: Thought content normal.      BP (!) 128/49 (BP Location: Right Arm, Patient Position: Sitting, Cuff Size: Large)   Pulse (!) 58   Temp 98.1 F (36.7 C) (Oral)   Resp 16   Ht 5' 7 (1.702 m)   Wt 210 lb (95.3 kg)   SpO2 97%   BMI 32.89 kg/m  Wt Readings from Last 3 Encounters:   01/30/24 210 lb (95.3 kg)  12/16/23 209 lb (94.8 kg)  10/21/23 211 lb (95.7 kg)       Assessment & Plan:   Problem List Items Addressed This Visit       Unprioritized   Vitamin D  deficiency - Primary   Continues otc vit D supplement. Has been stable.      Tinnitus   Reports stable.       Paroxysmal atrial fibrillation (HCC)   Rate controlled.  Scheduled for ablation. Management per cardiology.      OSA (obstructive sleep apnea)   On CPAP.  Good compliance.      Obesity (BMI 30-39.9)   Wt Readings from Last 3 Encounters:  01/30/24 210 lb (95.3 kg)  12/16/23 209 lb (94.8 kg)  10/21/23 211 lb (95.7 kg)   States that he has joined [employer] and tries to go 5 days a week. Trying to limit sugar. Encouraged portion control.  Mild intermittent asthma   Has used his inhaler some the last few weeks- stable with prn albuterol .       Hypogonadism male   Stable without testosterone  therapy.      Hypertension   BP Readings from Last 3 Encounters:  01/30/24 (!) 128/49  12/16/23 (!) 116/50  10/21/23 132/60   Stable, continue amlodipine , hyzaar, toprol  xl.        Hyperlipidemia   Lab Results  Component Value Date   CHOL 176 11/22/2022   HDL 42 11/22/2022   LDLCALC 104 (H) 11/22/2022   LDLDIRECT 116.0 09/11/2020   TRIG 182 (H) 11/22/2022   CHOLHDL 4.2 11/22/2022   Not on statin. Update lipid panel.      Relevant Orders   Lipid panel   Hypercoagulable state due to paroxysmal atrial fibrillation (HCC)   Continues Eliquis .       GERD (gastroesophageal reflux disease)   Stable on omeprazole . Continue same.      BPH (benign prostatic hyperplasia)   Lab Results  Component Value Date   PSA 0.71 06/18/2022   PSA 0.99 01/11/2020   PSA 0.64 12/31/2018   On flomax , urinating well. Update PSA.        Relevant Orders   PSA   Borderline diabetes   Lab Results  Component Value Date   HGBA1C 6.1 (H) 11/22/2022   Update A1C.       Relevant Orders    HgB A1c   Anemia   Lab Results  Component Value Date   WBC 4.7 06/11/2023   HGB 13.3 06/11/2023   HCT 43.9 06/11/2023   MCV 93 06/11/2023   PLT 168 06/11/2023   Has been stable.       Allergic conjunctivitis   Stable with zyrtec and flonase  prn.       Other Visit Diagnoses       History of basal cell carcinoma (BCC) of skin       Relevant Orders   Ambulatory referral to Dermatology       I have discontinued Fairy DEL. [employer] oxyCODONE . I am also having him maintain his acetaminophen , guaiFENesin, Fish Oil, albuterol , GLUCOSAMINE-CHONDROITIN PO, diphenhydrAMINE , Vitamin D , Turmeric Curcumin, cetirizine, sildenafil , naphazoline-glycerin , fluticasone , NON FORMULARY, methocarbamol , omeprazole , losartan -hydrochlorothiazide , tamsulosin , metoprolol  succinate, Ascorbic Acid (VITAMIN C PO), amiodarone , pravastatin , Eliquis , and amLODipine .  No orders of the defined types were placed in this encounter.

## 2024-01-30 NOTE — Patient Instructions (Signed)
  You had a follow-up visit to discuss your upcoming cardiac ablation and medication management. We reviewed your current health status, including your atrial fibrillation, sleep apnea, diabetes, hypertension, GERD, BPH, asthma, allergies, lactose intolerance, and tinnitus.  YOUR PLAN:  ATRIAL FIBRILLATION: You have recurrent atrial fibrillation and are scheduled for your third cardiac ablation on February 20, 2024. -Proceed with the scheduled CT scan and blood work on August 22nd. -Continue taking amiodarone  until further evaluation after the ablation. -We will discuss alternative antiarrhythmic medications if the ablation is unsuccessful.  OBSTRUCTIVE SLEEP APNEA: You are using a CPAP machine, which has improved your sleep quality, though you still experience daytime drowsiness. -Continue using your CPAP machine.  TYPE 2 DIABETES MELLITUS: Your diabetes is borderline controlled, and you have had some recent dietary indulgences. -We will order a diabetes test to monitor your glucose control. -Continue managing your diet by reducing sugar and carbohydrates.  HYPERTENSION: Your high blood pressure is being managed with medications. -Continue taking your current medications: amlodipine , losartan  hydrochlorothiazide , and toprol .  GASTROESOPHAGEAL REFLUX DISEASE (GERD): Your GERD is well-managed with omeprazole . -Continue taking omeprazole .  BENIGN PROSTATIC HYPERPLASIA (BPH): Your urinary symptoms are stable and managed with Flomax . -Continue taking Flomax .  ASTHMA: You have occasional asthma symptoms managed with an albuterol  inhaler. -Continue using your albuterol  inhaler as needed. -Monitor for any increase in the frequency of inhaler use.  ALLERGIC RHINITIS: Your allergies are managed with Zyrtec and Clinisol. -Continue taking Zyrtec and Clinisol as needed.  LACTOSE INTOLERANCE: You have long-standing lactose intolerance and lactase pills caused diarrhea. -Avoid lactose-containing  foods as much as possible.  TINNITUS: You have persistent tinnitus and we discussed skepticism regarding unverified treatments. -No specific treatment at this time.

## 2024-01-30 NOTE — Assessment & Plan Note (Signed)
 Lab Results  Component Value Date   WBC 4.7 06/11/2023   HGB 13.3 06/11/2023   HCT 43.9 06/11/2023   MCV 93 06/11/2023   PLT 168 06/11/2023   Has been stable.

## 2024-01-30 NOTE — Assessment & Plan Note (Signed)
 Continues Eliquis.

## 2024-01-30 NOTE — Assessment & Plan Note (Signed)
 BP Readings from Last 3 Encounters:  01/30/24 (!) 128/49  12/16/23 (!) 116/50  10/21/23 132/60   Stable, continue amlodipine , hyzaar, toprol  xl.

## 2024-01-30 NOTE — Assessment & Plan Note (Addendum)
 Wt Readings from Last 3 Encounters:  01/30/24 210 lb (95.3 kg)  12/16/23 209 lb (94.8 kg)  10/21/23 211 lb (95.7 kg)   States that he has joined [employer] and tries to go 5 days a week. Trying to limit sugar. Encouraged portion control.

## 2024-01-30 NOTE — Assessment & Plan Note (Signed)
 Stable with zyrtec and flonase  prn.

## 2024-01-30 NOTE — Assessment & Plan Note (Signed)
 Rate controlled.  Scheduled for ablation. Management per cardiology.

## 2024-01-30 NOTE — Assessment & Plan Note (Signed)
 Lab Results  Component Value Date   PSA 0.71 06/18/2022   PSA 0.99 01/11/2020   PSA 0.64 12/31/2018   On flomax , urinating well. Update PSA.

## 2024-01-30 NOTE — Assessment & Plan Note (Signed)
 Lab Results  Component Value Date   HGBA1C 6.1 (H) 11/22/2022   Update A1C.

## 2024-01-30 NOTE — Assessment & Plan Note (Signed)
 Stable without testosterone  therapy.

## 2024-01-30 NOTE — Assessment & Plan Note (Signed)
 Has used his inhaler some the last few weeks- stable with prn albuterol .

## 2024-01-30 NOTE — Assessment & Plan Note (Signed)
Stable on omeprazole. Continue same.  ?

## 2024-01-30 NOTE — Assessment & Plan Note (Signed)
 Continues otc vit D supplement. Has been stable.

## 2024-01-30 NOTE — Assessment & Plan Note (Signed)
 Reports stable

## 2024-01-30 NOTE — Assessment & Plan Note (Signed)
 Lab Results  Component Value Date   CHOL 176 11/22/2022   HDL 42 11/22/2022   LDLCALC 104 (H) 11/22/2022   LDLDIRECT 116.0 09/11/2020   TRIG 182 (H) 11/22/2022   CHOLHDL 4.2 11/22/2022   Not on statin. Update lipid panel.

## 2024-02-02 ENCOUNTER — Ambulatory Visit: Payer: Self-pay | Admitting: Family

## 2024-02-09 ENCOUNTER — Other Ambulatory Visit: Payer: Self-pay | Admitting: Family

## 2024-02-09 DIAGNOSIS — I10 Essential (primary) hypertension: Secondary | ICD-10-CM

## 2024-02-20 ENCOUNTER — Ambulatory Visit (HOSPITAL_COMMUNITY)
Admission: RE | Admit: 2024-02-20 | Discharge: 2024-02-20 | Disposition: A | Discharge status: Home / Self Care | Source: Ambulatory Visit | Attending: Cardiology | Admitting: Cardiology

## 2024-02-20 DIAGNOSIS — I480 Paroxysmal atrial fibrillation: Secondary | ICD-10-CM | POA: Insufficient documentation

## 2024-02-20 DIAGNOSIS — I2510 Atherosclerotic heart disease of native coronary artery without angina pectoris: Secondary | ICD-10-CM | POA: Diagnosis not present

## 2024-02-20 MED ORDER — IOHEXOL 350 MG/ML SOLN
80.0000 mL | Freq: Once | INTRAVENOUS | Status: AC | PRN
  Administered 2024-02-20: 80 mL via INTRAVENOUS

## 2024-02-21 LAB — CBC
Hematocrit: 42.1 % (ref 37.5–51.0)
Hemoglobin: 13.7 g/dL (ref 13.0–17.7)
MCH: 29.5 pg (ref 26.6–33.0)
MCHC: 32.5 g/dL (ref 31.5–35.7)
MCV: 91 fL (ref 79–97)
Platelets: 157 x10E3/uL (ref 150–450)
RBC: 4.65 x10E6/uL (ref 4.14–5.80)
RDW: 13.6 % (ref 11.6–15.4)
WBC: 4.3 x10E3/uL (ref 3.4–10.8)

## 2024-02-21 LAB — BASIC METABOLIC PANEL WITH GFR
BUN/Creatinine Ratio: 21 (ref 10–24)
BUN: 22 mg/dL (ref 8–27)
CO2: 20 mmol/L (ref 20–29)
Calcium: 9 mg/dL (ref 8.6–10.2)
Chloride: 107 mmol/L — ABNORMAL HIGH (ref 96–106)
Creatinine, Ser: 1.03 mg/dL (ref 0.76–1.27)
Glucose: 108 mg/dL — ABNORMAL HIGH (ref 70–99)
Potassium: 4.3 mmol/L (ref 3.5–5.2)
Sodium: 143 mmol/L (ref 134–144)
eGFR: 79 mL/min/1.73 (ref >59)

## 2024-02-23 ENCOUNTER — Ambulatory Visit: Payer: Self-pay | Admitting: Cardiology

## 2024-02-27 ENCOUNTER — Encounter (HOSPITAL_COMMUNITY): Payer: Self-pay

## 2024-03-02 ENCOUNTER — Other Ambulatory Visit: Payer: Self-pay | Admitting: Family

## 2024-03-02 DIAGNOSIS — I10 Essential (primary) hypertension: Secondary | ICD-10-CM

## 2024-03-02 DIAGNOSIS — N401 Enlarged prostate with lower urinary tract symptoms: Secondary | ICD-10-CM

## 2024-03-04 NOTE — Pre-Procedure Instructions (Signed)
 Attempted to call patient regarding procedure instructions.  Left voicemail on the following items: Arrival time 1000 Nothing to eat or drink after midnight No meds AM of procedure Responsible person to drive you home and stay with you for 24 hrs  Have you missed any doses of anti-coagulant Eliquis - should be taken twice a day, if you have missed any doses please let us  know.  Don't take dose morning of procedure.

## 2024-03-05 ENCOUNTER — Ambulatory Visit (HOSPITAL_BASED_OUTPATIENT_CLINIC_OR_DEPARTMENT_OTHER): Admitting: Anesthesiology

## 2024-03-05 ENCOUNTER — Ambulatory Visit (HOSPITAL_COMMUNITY)
Admission: RE | Admit: 2024-03-05 | Discharge: 2024-03-05 | Disposition: A | Discharge status: Home / Self Care | Attending: Cardiology | Admitting: Cardiology

## 2024-03-05 ENCOUNTER — Ambulatory Visit (HOSPITAL_COMMUNITY): Admitting: Anesthesiology

## 2024-03-05 ENCOUNTER — Other Ambulatory Visit: Payer: Self-pay

## 2024-03-05 ENCOUNTER — Encounter (HOSPITAL_COMMUNITY)
Admission: RE | Disposition: A | Discharge status: Home / Self Care | Payer: Self-pay | Source: Home / Self Care | Attending: Cardiology

## 2024-03-05 DIAGNOSIS — I2584 Coronary atherosclerosis due to calcified coronary lesion: Secondary | ICD-10-CM | POA: Diagnosis not present

## 2024-03-05 DIAGNOSIS — I10 Essential (primary) hypertension: Secondary | ICD-10-CM | POA: Insufficient documentation

## 2024-03-05 DIAGNOSIS — G4730 Sleep apnea, unspecified: Secondary | ICD-10-CM | POA: Insufficient documentation

## 2024-03-05 DIAGNOSIS — Z87891 Personal history of nicotine dependence: Secondary | ICD-10-CM | POA: Insufficient documentation

## 2024-03-05 DIAGNOSIS — J45909 Unspecified asthma, uncomplicated: Secondary | ICD-10-CM | POA: Insufficient documentation

## 2024-03-05 DIAGNOSIS — I483 Typical atrial flutter: Secondary | ICD-10-CM

## 2024-03-05 DIAGNOSIS — I4819 Other persistent atrial fibrillation: Secondary | ICD-10-CM | POA: Diagnosis not present

## 2024-03-05 DIAGNOSIS — E785 Hyperlipidemia, unspecified: Secondary | ICD-10-CM | POA: Diagnosis not present

## 2024-03-05 DIAGNOSIS — Z79899 Other long term (current) drug therapy: Secondary | ICD-10-CM | POA: Diagnosis not present

## 2024-03-05 DIAGNOSIS — I4891 Unspecified atrial fibrillation: Secondary | ICD-10-CM

## 2024-03-05 HISTORY — PX: ATRIAL FIBRILLATION ABLATION: EP1191

## 2024-03-05 LAB — POCT ACTIVATED CLOTTING TIME: Activated Clotting Time: 308 s

## 2024-03-05 MED ORDER — PROTAMINE SULFATE 10 MG/ML IV SOLN
INTRAVENOUS | Status: DC | PRN
Start: 2024-03-05 — End: 2024-03-05
  Administered 2024-03-05: 40 mg via INTRAVENOUS

## 2024-03-05 MED ORDER — HEPARIN SODIUM (PORCINE) 1000 UNIT/ML IJ SOLN
INTRAMUSCULAR | Status: AC
  Filled 2024-03-05: qty 20

## 2024-03-05 MED ORDER — SODIUM CHLORIDE 0.9 % IV SOLN: INTRAVENOUS | Status: DC

## 2024-03-05 MED ORDER — SODIUM CHLORIDE 0.9 % IV SOLN: 250.0000 mL | INTRAVENOUS | Status: DC | PRN

## 2024-03-05 MED ORDER — SUGAMMADEX SODIUM 200 MG/2ML IV SOLN
INTRAVENOUS | Status: DC | PRN
  Administered 2024-03-05: 200 mg via INTRAVENOUS

## 2024-03-05 MED ORDER — ONDANSETRON HCL 4 MG/2ML IJ SOLN
INTRAMUSCULAR | Status: DC | PRN
  Administered 2024-03-05: 4 mg via INTRAVENOUS

## 2024-03-05 MED ORDER — ATROPINE SULFATE 1 MG/10ML IJ SOSY
PREFILLED_SYRINGE | INTRAMUSCULAR | Status: DC | PRN
  Administered 2024-03-05: 1 mg via INTRAVENOUS

## 2024-03-05 MED ORDER — SODIUM CHLORIDE 0.9% FLUSH: 3.0000 mL | Freq: Two times a day (BID) | INTRAVENOUS | Status: DC

## 2024-03-05 MED ORDER — HEPARIN SODIUM (PORCINE) 1000 UNIT/ML IJ SOLN
INTRAMUSCULAR | Status: DC | PRN
  Administered 2024-03-05: 14000 [IU] via INTRAVENOUS

## 2024-03-05 MED ORDER — LIDOCAINE 2% (20 MG/ML) 5 ML SYRINGE
INTRAMUSCULAR | Status: DC | PRN
  Administered 2024-03-05: 50 mg via INTRAVENOUS

## 2024-03-05 MED ORDER — FENTANYL CITRATE (PF) 100 MCG/2ML IJ SOLN
INTRAMUSCULAR | Status: DC | PRN
  Administered 2024-03-05: 100 ug via INTRAVENOUS

## 2024-03-05 MED ORDER — ACETAMINOPHEN 325 MG PO TABS: 650.0000 mg | ORAL_TABLET | ORAL | Status: DC | PRN

## 2024-03-05 MED ORDER — ROCURONIUM BROMIDE 10 MG/ML (PF) SYRINGE
PREFILLED_SYRINGE | INTRAVENOUS | Status: DC | PRN
  Administered 2024-03-05: 60 mg via INTRAVENOUS
  Administered 2024-03-05: 20 mg via INTRAVENOUS

## 2024-03-05 MED ORDER — DEXAMETHASONE SODIUM PHOSPHATE 10 MG/ML IJ SOLN
INTRAMUSCULAR | Status: DC | PRN
  Administered 2024-03-05: 10 mg via INTRAVENOUS

## 2024-03-05 MED ORDER — SODIUM CHLORIDE 0.9% FLUSH: 3.0000 mL | INTRAVENOUS | Status: DC | PRN

## 2024-03-05 MED ORDER — PROPOFOL 10 MG/ML IV BOLUS
INTRAVENOUS | Status: DC | PRN
  Administered 2024-03-05: 160 mg via INTRAVENOUS

## 2024-03-05 MED ORDER — FENTANYL CITRATE (PF) 100 MCG/2ML IJ SOLN
INTRAMUSCULAR | Status: AC
  Filled 2024-03-05: qty 2

## 2024-03-05 MED ORDER — ATROPINE SULFATE 1 MG/10ML IJ SOSY
PREFILLED_SYRINGE | INTRAMUSCULAR | Status: AC
  Filled 2024-03-05: qty 10

## 2024-03-05 MED ORDER — ONDANSETRON HCL 4 MG/2ML IJ SOLN
4.0000 mg | Freq: Four times a day (QID) | INTRAMUSCULAR | Status: DC | PRN
Start: 2024-03-05 — End: 2024-03-05

## 2024-03-05 MED ORDER — HEPARIN (PORCINE) IN NACL 1000-0.9 UT/500ML-% IV SOLN
INTRAVENOUS | Status: DC | PRN
  Administered 2024-03-05 (×3): 500 mL

## 2024-03-05 SURGICAL SUPPLY — 19 items
BLANKET WARM UNDERBOD FULL ACC (MISCELLANEOUS) ×1 IMPLANT
CABLE FARASTAR GEN2 SNGL USE (CABLE) IMPLANT
CATH EZ STEER NAV 8MM D-F CUR (ABLATOR) IMPLANT
CATH FARAWAVE 2.0 31 (CATHETERS) IMPLANT
CATH GE 8FR SOUNDSTAR (CATHETERS) IMPLANT
CATH OCTARAY 2.0 F 3-3-3-3-3 (CATHETERS) IMPLANT
CATH WEBSTER BI DIR CS D-F CRV (CATHETERS) IMPLANT
CLOSURE MYNX CONTROL 6F/7F (Vascular Products) IMPLANT
CLOSURE PERCLOSE PROSTYLE (VASCULAR PRODUCTS) IMPLANT
COVER SWIFTLINK CONNECTOR (BAG) ×1 IMPLANT
DILATOR VESSEL 38 20CM 16FR (INTRODUCER) IMPLANT
GUIDEWIRE INQWIRE 1.5J.035X260 (WIRE) IMPLANT
KIT VERSACROSS CNCT FARADRIVE (KITS) IMPLANT
PACK EP LF (CUSTOM PROCEDURE TRAY) ×1 IMPLANT
PAD DEFIB RADIO PHYSIO CONN (PAD) ×1 IMPLANT
PATCH CARTO3 (PAD) IMPLANT
SHEATH FARADRIVE STEERABLE (SHEATH) IMPLANT
SHEATH PINNACLE 8F 10CM (SHEATH) IMPLANT
SHEATH PINNACLE 9F 10CM (SHEATH) IMPLANT

## 2024-03-05 NOTE — Anesthesia Preprocedure Evaluation (Addendum)
 Anesthesia Evaluation  Patient identified by MRN, date of birth, ID band Patient awake    Reviewed: Allergy  & Precautions, NPO status , Patient's Chart, lab work & pertinent test results  History of Anesthesia Complications Negative for: history of anesthetic complications  Airway Mallampati: III  TM Distance: >3 FB Neck ROM: Full   Comment: Previous grade I view with [place] 3, easy mask. Also previous grade II view with MAC 4. Dental  (+) Dental Advisory Given   Pulmonary neg shortness of breath, asthma (last used inhaler ~1 month ago) , sleep apnea , neg COPD, neg recent URI, former smoker   Pulmonary exam normal breath sounds clear to auscultation       Cardiovascular hypertension, (-) angina + CAD  (-) Past MI, (-) Cardiac Stents and (-) CABG + dysrhythmias Atrial Fibrillation  Rhythm:Regular Rate:Normal  HLD  TTE 11/01/2020: IMPRESSIONS    1. Left ventricular ejection fraction, by estimation, is 60 to 65%. The  left ventricle has normal function. The left ventricle has no regional  wall motion abnormalities. Left ventricular diastolic parameters were  normal.   2. Right ventricular systolic function is normal. The right ventricular  size is normal.   3. The mitral valve is normal in structure. Trivial mitral valve  regurgitation. No evidence of mitral stenosis.   4. The aortic valve is normal in structure. Aortic valve regurgitation is  not visualized. No aortic stenosis is present.   5. The inferior vena cava is normal in size with greater than 50%  respiratory variability, suggesting right atrial pressure of 3 mmHg.     Neuro/Psych neg Seizures negative neurological ROS     GI/Hepatic Neg liver ROS,GERD  ,,Diverticulosis    Endo/Other  negative endocrine ROSneg diabetes    Renal/GU negative Renal ROS   BPH    Musculoskeletal  (+) Arthritis , Osteoarthritis,    Abdominal  (+) + obese  Peds   Hematology  (+) Blood dyscrasia, anemia Lab Results      Component                Value               Date                      WBC                      4.3                 02/20/2024                HGB                      13.7                02/20/2024                HCT                      42.1                02/20/2024                MCV                      91  02/20/2024                PLT                      157                 02/20/2024              Anesthesia Other Findings   Reproductive/Obstetrics                              Anesthesia Physical Anesthesia Plan  ASA: 3  Anesthesia Plan: General   Post-op Pain Management:    Induction: Intravenous  PONV Risk Score and Plan: 2 and Ondansetron , Dexamethasone  and Treatment may vary due to age or medical condition  Airway Management Planned: Oral ETT  Additional Equipment:   Intra-op Plan:   Post-operative Plan: Extubation in OR  Informed Consent: I have reviewed the patients History and Physical, chart, labs and discussed the procedure including the risks, benefits and alternatives for the proposed anesthesia with the patient or authorized representative who has indicated his/her understanding and acceptance.     Dental advisory given  Plan Discussed with: CRNA and Anesthesiologist  Anesthesia Plan Comments: (Risks of general anesthesia discussed including, but not limited to, sore throat, hoarse voice, chipped/damaged teeth, injury to vocal cords, nausea and vomiting, allergic reactions, lung infection, heart attack, stroke, and death. All questions answered. )         Anesthesia Quick Evaluation

## 2024-03-05 NOTE — Progress Notes (Signed)
 Up and walked and small amt bleeding noted left groin and dressing changed and no further oozing noted

## 2024-03-05 NOTE — Discharge Instructions (Signed)

## 2024-03-05 NOTE — Anesthesia Postprocedure Evaluation (Signed)
 Anesthesia Post Note  Patient: Aaron Ruiz  Procedure(s) Performed: ATRIAL FIBRILLATION ABLATION     Patient location during evaluation: PACU Anesthesia Type: General Level of consciousness: awake Pain management: pain level controlled Vital Signs Assessment: post-procedure vital signs reviewed and stable Respiratory status: spontaneous breathing, nonlabored ventilation and respiratory function stable Cardiovascular status: blood pressure returned to baseline and stable Postop Assessment: no apparent nausea or vomiting Anesthetic complications: no   No notable events documented.  Last Vitals:  Vitals:   03/05/24 1345 03/05/24 1412  BP: 128/62 (!) 131/52  Pulse: 63 66  Resp: 13 13  Temp:    SpO2: 92% 95%    Last Pain:  Vitals:   03/05/24 1335  TempSrc: Tympanic  PainSc:                  [name]

## 2024-03-05 NOTE — Progress Notes (Signed)
Dr Camnitz in and ok to d/c home 

## 2024-03-05 NOTE — Transfer of Care (Signed)
 Immediate Anesthesia Transfer of Care Note  Patient: Aaron Ruiz  Procedure(s) Performed: ATRIAL FIBRILLATION ABLATION  Patient Location: Cath Lab  Anesthesia Type:General  Level of Consciousness: awake, alert , oriented, and patient cooperative  Airway & Oxygen Therapy: Patient Spontanous Breathing  Post-op Assessment: Report given to RN and Post -op Vital signs reviewed and stable  Post vital signs: Reviewed and stable  Last Vitals:  Vitals Value Taken Time  BP 127/62 1309  Temp    Pulse 63 03/05/24 13:09  Resp 14 03/05/24 13:09  SpO2 95 % 03/05/24 13:09  Vitals shown include unfiled device data.  Last Pain:  Vitals:   03/05/24 1020  TempSrc: Oral  PainSc:          Complications: No notable events documented.

## 2024-03-05 NOTE — Anesthesia Procedure Notes (Signed)
 Procedure Name: Intubation Date/Time: 03/05/2024 11:43 AM  Performed by: Cindie Donald CROME, CRNAPre-anesthesia Checklist: Patient identified, Emergency Drugs available, Suction available and Patient being monitored Patient Re-evaluated:Patient Re-evaluated prior to induction Oxygen Delivery Method: Circle System Utilized Preoxygenation: Pre-oxygenation with 100% oxygen Induction Type: IV induction Ventilation: Mask ventilation without difficulty Laryngoscope Size: Mac and 4 Grade View: Grade I Tube type: Oral Tube size: 7.5 mm Number of attempts: 1 Airway Equipment and Method: Stylet Placement Confirmation: ETT inserted through vocal cords under direct vision, positive ETCO2 and breath sounds checked- equal and bilateral Secured at: 23 cm Tube secured with: Tape Dental Injury: Teeth and Oropharynx as per pre-operative assessment  Comments: Grade 1 view, MAC 4 with cricoid pressure

## 2024-03-05 NOTE — Progress Notes (Signed)
 Patient arrived to cath holding area bay 20. Patient A/O x4. Answers questions and follows commands appropriately. No neuro deficits noted. Bilateral groin sites are dry/intact with no bleeding or hematoma noted. Post activity and precautions explained. Patient verbalized understanding. EKG obtained. Will continue to monitor per orders/protocol.Aaron Ruiz E

## 2024-03-05 NOTE — H&P (Signed)
  Electrophysiology Office Note:   Date:  03/05/2024  ID:  Aaron Ruiz, DOB December 29, 1955, MRN [id number]  Primary Cardiologist: Aaron [name], DO Primary Heart Failure: None Electrophysiologist: Aaron Nembhard Aaron Norton, MD      History of Present Illness:   Aaron Ruiz is a 68 y.o. male with h/o fibrillation, hyperlipidemia, hypertension seen today for routine electrophysiology followup.   Today, denies symptoms of palpitations, chest pain, dyspnea, orthopnea, PND, lower extremity edema, claudication, dizziness, presyncope, syncope, bleeding, or neurologic sequela. The patient is tolerating medications without difficulties. Plan ablation today.   EP Information / Studies Reviewed:    EKG is ordered today. Personal review as below.        Risk Assessment/Calculations:    CHA2DS2-VASc Score = 3   This indicates a 3.2% annual risk of stroke. The patient's score is based upon: CHF History: 0 HTN History: 1 Diabetes History: 0 Stroke History: 0 Vascular Disease History: 1 Age Score: 1 Gender Score: 0            Physical Exam:   VS:  There were no vitals taken for this visit.   Wt Readings from Last 3 Encounters:  01/30/24 95.3 kg  12/16/23 94.8 kg  10/21/23 95.7 kg    GEN: Well nourished, well developed in no acute distress NECK: No JVD; No carotid bruits CARDIAC: Regular rate and rhythm, no murmurs, rubs, gallops RESPIRATORY:  Clear to auscultation without rales, wheezing or rhonchi  ABDOMEN: Soft, non-tender, non-distended EXTREMITIES:  No edema; No deformity    ASSESSMENT AND PLAN:    1.  Persistent atrial fibrillation: Aaron Ruiz has presented today for surgery, with the diagnosis of AF.  The various methods of treatment have been discussed with the patient and family. After consideration of risks, benefits and other options for treatment, the patient has consented to  Procedure(s): Catheter ablation as a surgical intervention .  Risks include but not  limited to complete heart block, stroke, esophageal damage, nerve damage, bleeding, vascular damage, tamponade, perforation, MI, and death. The patient's history has been reviewed, patient examined, no change in status, stable for surgery.  I have reviewed the patient's chart and labs.  Questions were answered to the patient's satisfaction.    Aaron Badger Norton, MD 03/05/2024 10:01 AM

## 2024-03-07 ENCOUNTER — Encounter (HOSPITAL_COMMUNITY): Payer: Self-pay | Admitting: Cardiology

## 2024-03-08 ENCOUNTER — Telehealth (HOSPITAL_COMMUNITY): Payer: Self-pay

## 2024-03-08 NOTE — Telephone Encounter (Signed)
 Attempted to reach patient to follow up with procedure completed on 03/05/24, no answer. Left VM for patient to return call.

## 2024-03-08 NOTE — Telephone Encounter (Signed)
 Spoke with patient to complete post procedure follow up call.  Patient reports no complications with groin sites.   Instructions reviewed with patient:  It is normal to have bruising, tenderness, mild swelling, and a pea or marble sized lump/knot at the groin site which can take up to three months to resolve.  Get help right away if you notice sudden swelling at the puncture site.  Check your puncture site every day for signs of infection: fever, redness, swelling, pus drainage, warmth, foul odor or excessive pain. If this occurs, please call [phone], to speak with the RN Navigator. Get help right away if your puncture site is bleeding and the bleeding does not stop after applying firm pressure to the area.  You may continue to have skipped beats/ atrial fibrillation during the first several months after your procedure.  It is very important not to miss any doses of your blood thinner Eliquis .    You will follow up with the Afib clinic on 04/02/24 and follow up with the Afib clinic on 06/07/24.   Patient verbalized understanding to all instructions provided.

## 2024-03-11 ENCOUNTER — Ambulatory Visit: Admitting: Internal Medicine

## 2024-03-16 ENCOUNTER — Other Ambulatory Visit: Payer: Self-pay | Admitting: Family

## 2024-03-16 DIAGNOSIS — I10 Essential (primary) hypertension: Secondary | ICD-10-CM

## 2024-04-02 ENCOUNTER — Ambulatory Visit (HOSPITAL_COMMUNITY)
Admission: RE | Admit: 2024-04-02 | Discharge: 2024-04-02 | Disposition: A | Discharge status: Home / Self Care | Source: Ambulatory Visit | Attending: Internal Medicine | Admitting: Internal Medicine

## 2024-04-02 ENCOUNTER — Encounter (HOSPITAL_COMMUNITY): Payer: Self-pay | Admitting: Internal Medicine

## 2024-04-02 VITALS — BP 126/66 | HR 53 | Ht 67.0 in | Wt 212.0 lb

## 2024-04-02 DIAGNOSIS — I4819 Other persistent atrial fibrillation: Secondary | ICD-10-CM

## 2024-04-02 DIAGNOSIS — Z5181 Encounter for therapeutic drug level monitoring: Secondary | ICD-10-CM | POA: Diagnosis not present

## 2024-04-02 DIAGNOSIS — D6869 Other thrombophilia: Secondary | ICD-10-CM

## 2024-04-02 DIAGNOSIS — I480 Paroxysmal atrial fibrillation: Secondary | ICD-10-CM

## 2024-04-02 DIAGNOSIS — Z79899 Other long term (current) drug therapy: Secondary | ICD-10-CM

## 2024-04-02 NOTE — Progress Notes (Signed)
 Primary Care Physician: Aaron Setter, NP Primary Cardiologist: Dr Aaron Ruiz Primary Electrophysiologist: Dr Aaron Ruiz Referring Physician: [facility]   Aaron Ruiz is a 68 y.o. male with a history of HTN, HLD, CAC score of 783, and atrial fibrillation who presents for follow up in the [facility]. The patient was initially diagnosed with atrial fibrillation 10/03/20 after presenting to [facility] ED with symptoms of tachypalpitations. He had just seen Dr Aaron Ruiz who placed a cardiac monitor to screen for arrhythmias. Patient was started on Eliquis  for a CHADS2VASC score of 3. He was recently taken off of metoprolol  due to bradycardia, this was resumed at the ED. He denies any significant alcohol use but does admit to snoring and some daytime somnolence. Patient was seen by Dr Aaron Ruiz and started on flecainide . He eventually underwent afib ablation 02/08/22.   On follow up today, patient reports that he has done well since his ablation with no episodes of afib. He denies CP, swallowing pain, or groin issues. No bleeding issues on anticoagulation.   On follow up 12/09/22, patient is currently in atrial flutter. His ECG is abnormal and shows wide QRS complexes. He reports to be out of rhythm since this past Friday. He feels lightheaded and a little SOB with minimal exertion. He notes previously episodes were always brief and would convert to NSR quickly; over past couple of months episodes have seemed to last longer. Flecainide  increased to 150 mg BID on 6/3 per phone notes. He has not missed any doses of Eliquis .  On follow up 12/16/22, he is currently in atrial flutter. S/p successful DCCV on 6/13 with ERAF. He called on 6/14 noting HR 120-130s. Amlodipine  held and taking extra Toprol  for total of 50 mg daily.   On follow up 08/05/23, patient is currently in NSR. S/p Afib ablation on 07/08/23 by Dr. Inocencio. No episodes of Afib since ablation. He is doing great.  He is walking frequently and going to the gym. No chest pain or SOB. Leg sites healed without issue. No missed doses of Eliquis  5 mg BID.  On follow up 12/16/23, patient is currently in NSR. Patient contacted office noting increasing episodes of Afib and is tentatively scheduled for repeat ablation in September. He has noted 4 brief episodes of Afib in May and 4 so far in June. They last several minutes and he does not feel poorly during episodes. No missed doses of Eliquis .   On follow up 04/02/24, patient is currently in NSR. S/p Afib/flutter ablation on 03/05/24 by Dr. Inocencio. No episodes of Afib since ablation. He is taking amiodarone  200 mg daily. No chest pain or SOB. Leg sites healed without issue. No missed doses of anticoagulant.  Today, he denies symptoms of orthopnea, PND, lower extremity edema, dizziness, presyncope, syncope, snoring, daytime somnolence, bleeding, or neurologic sequela. The patient is tolerating medications without difficulties and is otherwise without complaint today.    Atrial Fibrillation Risk Factors:  he does have symptoms or diagnosis of sleep apnea. Patient declined sleep study. he does not have a history of rheumatic fever. he does not have a history of alcohol use. The patient does have a history of early familial atrial fibrillation or other arrhythmias. Sister has afib s/p ablation.  he has a BMI of Body mass index is 33.2 kg/m.Aaron Filed Weights   04/02/24 1039  Weight: 96.2 kg      Family History  Problem Relation Age of Onset   Coronary artery  disease Father        CABG X5   Diabetes Father    Colon cancer Maternal Aunt    Cancer Brother        lung (smoker)   Hypertension Other    Kidney disease Other     Atrial Fibrillation Management history:  Previous antiarrhythmic drugs: flecainide , amiodarone  Previous cardioversions: none Previous ablations: 02/08/22; 07/08/23, 03/05/24 CHADS2VASC score: 3 Anticoagulation history: Eliquis    Past  Medical History:  Diagnosis Date   Actinic keratosis 03/20/2009   Qualifier: Diagnosis of  By: Georgian DO, Aaron Ruiz    Allergic conjunctivitis 06/30/2018   Anemia    ANEMIA-NOS 02/06/2009   Qualifier: Diagnosis of  By: Aaron Ruiz     Asthma    uses inhaler   Basal cell carcinoma 2017   left side of nose   Basal cell carcinoma of nose 10/16/2020   Borderline diabetes 05/03/2011   BPH (benign prostatic hyperplasia) 07/29/2014   Chemosis of conjunctiva of both eyes 01/15/2017   Colon polyps    Daytime somnolence 09/21/2020   Diverticulosis    Dysrhythmia    Essential hypertension 02/06/2009   Qualifier: Diagnosis of  By: Aaron Ruiz      Fatigue 09/21/2020   Former smoker 09/21/2020   General medical examination 11/14/2010   GERD 02/06/2009   Qualifier: Diagnosis of  By: Aaron Ruiz     GERD (gastroesophageal reflux disease)    on meds   High cholesterol 02/06/2009   Qualifier: Diagnosis of  By: Aaron Ruiz     Hx of adenomatous colonic polyps 03/07/2009   Hx of eye surgery 2018   Left Eye was swollen and had procedure to remove excess swelling   Hyperglycemia 05/01/2011   Hyperlipidemia    Hypertension    Hypogonadism male 04/30/2011   Lactose intolerance    Lentigo 10/16/2020   Mild intermittent asthma 06/30/2018   Neoplasm of uncertain behavior of skin 10/16/2020   Obesity (BMI 30-39.9) 09/21/2020   OSA (obstructive sleep apnea) 11/22/2022   Osteoarthritis 09/24/2011   OVERWEIGHT 02/06/2009   Qualifier: Diagnosis of  By: Aaron Ruiz    Palpitations 09/21/2020   Paroxysmal atrial fibrillation (HCC) 10/06/2020   Rash/urticaria 08/07/2012   Recurrent UTI 07/30/2013   Routine general medical examination at a health care facility 12/16/2011   Secondary hypercoagulable state 10/06/2020   SKIN LESION 03/20/2009   Qualifier: Diagnosis of  By: Aaron Ruiz    Snoring 09/21/2020   Unspecified asthma(493.90) 02/06/2009    Centricity Description: ASTHMA Qualifier: Diagnosis of  By: Aaron Ruiz   Centricity Description: ASTHMA, INTERMITTENT, MILD Qualifier: Diagnosis of  By: Aaron Ruiz S    URI (upper respiratory infection) 07/23/2011   Vitamin D  deficiency 09/21/2020   Past Surgical History:  Procedure Laterality Date   ATRIAL FIBRILLATION ABLATION N/A 02/08/2022   Procedure: ATRIAL FIBRILLATION ABLATION;  Surgeon: Aaron Ruiz Soyla Lunger, MD;  Location: [facility] INVASIVE CV LAB;  Service: Cardiovascular;  Laterality: N/A;   ATRIAL FIBRILLATION ABLATION N/A 07/08/2023   Procedure: ATRIAL FIBRILLATION ABLATION;  Surgeon: Aaron Ruiz Soyla Lunger, MD;  Location: [facility] INVASIVE CV LAB;  Service: Cardiovascular;  Laterality: N/A;   ATRIAL FIBRILLATION ABLATION N/A 03/05/2024   Procedure: ATRIAL FIBRILLATION ABLATION;  Surgeon: Aaron Ruiz Soyla Lunger, MD;  Location: [facility] INVASIVE CV LAB;  Service: Cardiovascular;  Laterality: N/A;   CARDIOVERSION N/A 12/12/2022   Procedure: CARDIOVERSION;  Surgeon: Shlomo Wilbert SAUNDERS, MD;  [facility] INVASIVE  CV LAB;  Service: Cardiovascular;  Laterality: N/A;   CYSTECTOMY  05/2004   removed fromback /sebaceous cyst   INGUINAL HERNIA REPAIR  1995   left   mohs procedure  2017   left side of nose   NASAL SEPTUM SURGERY     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   Procedure: LEFT TOTAL HIP ARTHROPLASTY ANTERIOR APPROACH;  Surgeon: Vernetta Lonni GRADE, MD;  Location: [facility] OR;  Service: Orthopedics;  Laterality: Left;    Current Outpatient Medications  Medication Sig Dispense Refill   acetaminophen  (TYLENOL ) 500 MG tablet Take 1 tablet (500 mg total) by mouth every 6 (six) hours as needed. 30 tablet 5   albuterol  (VENTOLIN  HFA) 108 (90 Base) MCG/ACT inhaler Inhale 2 puffs into the lungs every 6 (six) hours as needed for wheezing or shortness of breath. 8 g 0   amiodarone  (PACERONE ) 200 MG tablet Take 1 tablet (200 mg total) by mouth 2 (two) times daily for 30 days, THEN 1 tablet (200  mg total) daily. 60 tablet 3   amLODipine  (NORVASC ) 10 MG tablet Take 1 tablet (10 mg total) by mouth daily. 90 tablet 0   apixaban  (ELIQUIS ) 5 MG TABS tablet TAKE 1 TABLET(5 MG) BY MOUTH TWICE DAILY 180 tablet 1   Ascorbic Acid (VITAMIN C PO) Take 1 packet by mouth daily. Taking emergen vitamin C - mix in water- He takes daily     cetirizine (ZYRTEC) 10 MG tablet Take 10 mg by mouth daily.     Cholecalciferol  (VITAMIN D ) 50 MCG (2000 UT) tablet Take 2,000 Units by mouth daily.     diphenhydrAMINE  (BENADRYL ) 25 MG tablet Take 25 mg by mouth at bedtime.     fluticasone  (FLONASE ) 50 MCG/ACT nasal spray Place 2 sprays into both nostrils daily as needed for allergies or rhinitis.     GLUCOSAMINE-CHONDROITIN PO Take 1 tablet by mouth daily.     guaiFENesin (MUCINEX) 600 MG 12 hr tablet Take 600 mg by mouth daily as needed for to loosen phlegm or cough.     losartan -hydrochlorothiazide  (HYZAAR) 100-25 MG tablet TAKE 1 TABLET BY MOUTH DAILY 90 tablet 0   metoprolol  succinate (TOPROL -XL) 25 MG 24 hr tablet TAKE 1 TABLET BY MOUTH DAILY 90 tablet 0   naphazoline-glycerin  (CLEAR EYES REDNESS) 0.012-0.25 % SOLN Place 1-2 drops into both eyes 4 (four) times daily as needed for eye irritation.     NON FORMULARY Pt uses c-pap nightly     Omega-3 Fatty Acids  (FISH OIL) 1200 MG CAPS Take 1,200 mg by mouth daily.     omeprazole  (PRILOSEC) 40 MG capsule TAKE 1 CAPSULE(40 MG) BY MOUTH DAILY 90 capsule 1   pravastatin  (PRAVACHOL ) 40 MG tablet TAKE 1 TABLET(40 MG) BY MOUTH DAILY 90 tablet 1   sildenafil  (REVATIO ) 20 MG tablet 1-2 tabs by mouth prior to sexual activity 30 tablet 5   tamsulosin  (FLOMAX ) 0.4 MG CAPS capsule TAKE 1 CAPSULE(0.4 MG) BY MOUTH DAILY 90 capsule 0   Turmeric Curcumin 500 MG CAPS Take 500 mg by mouth daily.     No current facility-administered medications for this encounter.    Allergies  Allergen Reactions   Iodinated Contrast Media Itching    Needs to be Pre-medicated - Severe Itching     Sulfonamide Derivatives     Hallucination (Childhood Reaction)   ROS- All systems are reviewed and negative except as per the HPI above.  Physical Exam: Vitals:   04/02/24 1039  BP: 126/66  Pulse: ROLLEN)  53  Weight: 96.2 kg  Height: 5' 7 (1.702 m)    GEN- The patient is well appearing, alert and oriented x 3 today.   Neck - no JVD or carotid bruit noted Lungs- Clear to ausculation bilaterally, normal work of breathing Heart- Regular bradycardic rate and rhythm, no murmurs, rubs or gallops, PMI not laterally displaced Extremities- no clubbing, cyanosis, or edema Skin - no rash or ecchymosis noted   Wt Readings from Last 3 Encounters:  04/02/24 96.2 kg  03/05/24 95.3 kg  01/30/24 95.3 kg    EKG today demonstrates  Vent. rate 53 BPM PR interval 178 ms QRS duration 90 ms QT/QTcB 456/427 ms P-R-T axes 57 17 26 Sinus bradycardia Otherwise normal ECG When compared with ECG of 05-Mar-2024 13:13, No significant change was found  Echo 11/01/20 1. Left ventricular ejection fraction, by estimation, is 60 to 65%. The  left ventricle has normal function. The left ventricle has no regional  wall motion abnormalities. Left ventricular diastolic parameters were  normal.   2. Right ventricular systolic function is normal. The right ventricular  size is normal.   3. The mitral valve is normal in structure. Trivial mitral valve  regurgitation. No evidence of mitral stenosis.   4. The aortic valve is normal in structure. Aortic valve regurgitation is  not visualized. No aortic stenosis is present.   5. The inferior vena cava is normal in size with greater than 50%  respiratory variability, suggesting right atrial pressure of 3 mmHg.   Epic records are reviewed at length today  CHA2DS2-VASc Score = 3  The patient's score is based upon: CHF History: 0 HTN History: 1 Diabetes History: 0 Stroke History: 0 Vascular Disease History: 1 Age Score: 1 Gender Score: 0        ASSESSMENT AND PLAN: Persistent Atrial Fibrillation / Flutter (ICD10:  I48.0) The patient's CHA2DS2-VASc score is 3, indicating a 3.2% annual risk of stroke.   S/p afib ablation 02/08/22 S/p DCCV on 12/12/22 S/p Afib ablation on 07/08/23 by Dr. Inocencio. S/p Afib ablation on 03/05/24 by Dr. Inocencio.   Patient is currently in NSR. Continue Toprol  25 mg daily. Anticipate discontinuation of amiodarone  at upcoming office visit.   High risk medication monitoring (ICD10: [id number]) Patient requires ongoing monitoring for anti-arrhythmic medication which has the potential to cause life threatening arrhythmias or AV block. Qtc stable. Continue amiodarone  200 mg daily.   Secondary Hypercoagulable State (ICD10:  D68.69) The patient is at significant risk for stroke/thromboembolism based upon his CHA2DS2-VASc Score of 3.  Continue Apixaban  (Eliquis ).  No missed doses.   HTN Stable today.     Follow up with Afib clinic as scheduled.    Aaron Heinrich, PA-C Afib Clinic [facility] [address] [place], [place] [address] [phone] 04/02/2024 11:04 AM

## 2024-04-08 ENCOUNTER — Encounter: Payer: Self-pay | Admitting: Dermatology

## 2024-04-08 ENCOUNTER — Ambulatory Visit: Admitting: Dermatology

## 2024-04-08 VITALS — BP 133/63 | HR 64

## 2024-04-08 DIAGNOSIS — L570 Actinic keratosis: Secondary | ICD-10-CM | POA: Diagnosis not present

## 2024-04-08 DIAGNOSIS — C44311 Basal cell carcinoma of skin of nose: Secondary | ICD-10-CM

## 2024-04-08 DIAGNOSIS — D229 Melanocytic nevi, unspecified: Secondary | ICD-10-CM

## 2024-04-08 DIAGNOSIS — W908XXA Exposure to other nonionizing radiation, initial encounter: Secondary | ICD-10-CM

## 2024-04-08 DIAGNOSIS — D099 Carcinoma in situ, unspecified: Secondary | ICD-10-CM

## 2024-04-08 DIAGNOSIS — C44319 Basal cell carcinoma of skin of other parts of face: Secondary | ICD-10-CM

## 2024-04-08 DIAGNOSIS — Z85828 Personal history of other malignant neoplasm of skin: Secondary | ICD-10-CM

## 2024-04-08 DIAGNOSIS — L821 Other seborrheic keratosis: Secondary | ICD-10-CM

## 2024-04-08 DIAGNOSIS — L814 Other melanin hyperpigmentation: Secondary | ICD-10-CM

## 2024-04-08 DIAGNOSIS — D0439 Carcinoma in situ of skin of other parts of face: Secondary | ICD-10-CM | POA: Diagnosis not present

## 2024-04-08 DIAGNOSIS — D485 Neoplasm of uncertain behavior of skin: Secondary | ICD-10-CM | POA: Diagnosis not present

## 2024-04-08 DIAGNOSIS — L578 Other skin changes due to chronic exposure to nonionizing radiation: Secondary | ICD-10-CM

## 2024-04-08 DIAGNOSIS — Z1283 Encounter for screening for malignant neoplasm of skin: Secondary | ICD-10-CM | POA: Diagnosis not present

## 2024-04-08 DIAGNOSIS — C4491 Basal cell carcinoma of skin, unspecified: Secondary | ICD-10-CM

## 2024-04-08 DIAGNOSIS — D1801 Hemangioma of skin and subcutaneous tissue: Secondary | ICD-10-CM

## 2024-04-08 HISTORY — DX: Basal cell carcinoma of skin, unspecified: C44.91

## 2024-04-08 HISTORY — DX: Carcinoma in situ, unspecified: D09.9

## 2024-04-08 MED ORDER — TRIAMCINOLONE ACETONIDE 0.1 % EX OINT: 1.0000 | TOPICAL_OINTMENT | Freq: Every day | CUTANEOUS | 3 refills | Status: AC | PRN

## 2024-04-08 NOTE — Patient Instructions (Signed)

## 2024-04-08 NOTE — Progress Notes (Unsigned)
 New Patient Visit   Subjective  Aaron Ruiz is a 68 y.o. male who presents for the following: Skin Cancer Screening and Full Body Skin Exam  The patient presents for Total-Body Skin Exam (TBSE) for skin cancer screening and mole check. The patient has spots, moles and lesions to be evaluated, some may be new or changing.  Hx of BCC on his nose previously.   Patient has been going to the beach over the past several years. Reports that he has been wearing SPF 30-50 sunscreen.   The following portions of the chart were reviewed this encounter and updated as appropriate: medications, allergies, medical history  Review of Systems:  No other skin or systemic complaints except as noted in HPI or Assessment and Plan.  Objective  Well appearing patient in no apparent distress; mood and affect are within normal limits.  A full examination was performed including scalp, head, eyes, ears, nose, lips, neck, chest, axillae, abdomen, back, buttocks, bilateral upper extremities, bilateral lower extremities, hands, feet, fingers, toes, fingernails, and toenails. All findings within normal limits unless otherwise noted below.   Relevant physical exam findings are noted in the Assessment and Plan.  Right Cheek 9mm pink pearly papule  Left superior forehead 6mm hyperkeratotic papule  Right Ala Nasi 8m pink pearly papule  Head - Anterior (Face), Left Forearm - Posterior (3), Left Hand - Posterior (3), Nose, Right Lower Leg - Posterior (3), Right Upper Arm - Posterior Erythematous thin papules/macules with gritty scale.   Assessment & Plan   SKIN CANCER SCREENING PERFORMED TODAY.  ACTINIC DAMAGE - Chronic condition, secondary to cumulative UV/sun exposure - diffuse scaly erythematous macules with underlying dyspigmentation - Recommend daily broad spectrum sunscreen SPF 30+ to sun-exposed areas, reapply every 2 hours as needed.  - Staying in the shade or wearing long sleeves, sun glasses  (UVA+UVB protection) and wide brim hats (4-inch brim around the entire circumference of the hat) are also recommended for sun protection.  - Call for new or changing lesions.  LENTIGINES, SEBORRHEIC KERATOSES, HEMANGIOMAS - Benign normal skin lesions - Benign-appearing - Call for any changes  MELANOCYTIC NEVI - Tan-brown and/or pink-flesh-colored symmetric macules and papules - Benign appearing on exam today - Observation - Call clinic for new or changing moles - Recommend daily use of broad spectrum spf 30+ sunscreen to sun-exposed areas.   HISTORY OF BASAL CELL CARCINOMA OF THE SKIN - No evidence of recurrence today - Recommend regular full body skin exams - Recommend daily broad spectrum sunscreen SPF 30+ to sun-exposed areas, reapply every 2 hours as needed.  - Call if any new or changing lesions are noted between office visits  NEOPLASM OF UNCERTAIN BEHAVIOR OF SKIN (3) Right Cheek Skin / nail biopsy Type of biopsy: tangential   Informed consent: discussed and consent obtained   Timeout: patient name, date of birth, surgical site, and procedure verified   Procedure prep:  Patient was prepped and draped in usual sterile fashion Prep type:  Isopropyl alcohol Anesthesia: the lesion was anesthetized in a standard fashion   Anesthetic:  1% lidocaine  w/ epinephrine 1-100,000 buffered w/ 8.4% NaHCO3 Instrument used: DermaBlade   Hemostasis achieved with: aluminum chloride   Outcome: patient tolerated procedure well   Post-procedure details: sterile dressing applied and wound care instructions given   Dressing type: bandage and petrolatum    Specimen 1 - Surgical pathology Differential Diagnosis: r/o NMSC vs other  Check Margins: No Left superior forehead Skin / nail biopsy  Type of biopsy: tangential   Informed consent: discussed and consent obtained   Timeout: patient name, date of birth, surgical site, and procedure verified   Procedure prep:  Patient was prepped and  draped in usual sterile fashion Prep type:  Isopropyl alcohol Anesthesia: the lesion was anesthetized in a standard fashion   Anesthetic:  1% lidocaine  w/ epinephrine 1-100,000 buffered w/ 8.4% NaHCO3 Instrument used: DermaBlade   Hemostasis achieved with: aluminum chloride   Outcome: patient tolerated procedure well   Post-procedure details: sterile dressing applied and wound care instructions given   Dressing type: bandage and petrolatum    Specimen 2 - Surgical pathology Differential Diagnosis: r/o NMSC vs other  Check Margins: No Right Ala Nasi Skin / nail biopsy Type of biopsy: tangential   Informed consent: discussed and consent obtained   Timeout: patient name, date of birth, surgical site, and procedure verified   Procedure prep:  Patient was prepped and draped in usual sterile fashion Prep type:  Isopropyl alcohol Anesthesia: the lesion was anesthetized in a standard fashion   Anesthetic:  1% lidocaine  w/ epinephrine 1-100,000 buffered w/ 8.4% NaHCO3 Instrument used: DermaBlade   Hemostasis achieved with: aluminum chloride   Outcome: patient tolerated procedure well   Post-procedure details: sterile dressing applied and wound care instructions given   Dressing type: bandage and petrolatum    Specimen 3 - Surgical pathology Differential Diagnosis: r/o NMSC vs other  Check Margins: No AK (ACTINIC KERATOSIS) (12) Head - Anterior (Face), Left Forearm - Posterior (3), Left Hand - Posterior (3), Nose, Right Lower Leg - Posterior (3), Right Upper Arm - Posterior Destruction of lesion - Head - Anterior (Face), Left Forearm - Posterior (3), Left Hand - Posterior (3), Nose, Right Lower Leg - Posterior (3), Right Upper Arm - Posterior Complexity: simple   Destruction method: cryotherapy   Informed consent: discussed and consent obtained   Timeout:  patient name, date of birth, surgical site, and procedure verified Outcome: patient tolerated procedure well with no complications    Post-procedure details: wound care instructions given     Return in about 6 months (around 10/07/2024) for FBSE.  LILLETTE Rollene Gobble, RN, am acting as scribe for [name], MD .   Documentation: I have reviewed the above documentation for accuracy and completeness, and I agree with the above.  [name], MD

## 2024-04-12 ENCOUNTER — Ambulatory Visit: Payer: Self-pay | Admitting: Dermatology

## 2024-04-12 LAB — SURGICAL PATHOLOGY

## 2024-04-14 NOTE — Progress Notes (Signed)
 Spoke with pt and gave him bx results and treatment recommendations

## 2024-05-03 ENCOUNTER — Encounter: Payer: Self-pay | Admitting: Radiology

## 2024-05-17 ENCOUNTER — Ambulatory Visit: Admitting: Orthopaedic Surgery

## 2024-05-17 ENCOUNTER — Encounter: Payer: Self-pay | Admitting: Orthopaedic Surgery

## 2024-05-17 ENCOUNTER — Other Ambulatory Visit: Payer: Self-pay

## 2024-05-17 DIAGNOSIS — Z96642 Presence of left artificial hip joint: Secondary | ICD-10-CM

## 2024-05-17 NOTE — Progress Notes (Signed)
 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  Since I have seen him last he has had ablations for a flutter.  He is still on Eliquis  and amiodarone .  He says the hip is feeling great.  He will be 68 years old and January but he looks much younger.  He stays active.  He reports that he has good range of motion and strength of that left hip but he still has a hard time getting up from the ground more so just stiffness in his back and his right hip.  He denies any groin pain on either hip.  Both hips move smoothly and fluidly with no blocks to rotation.  Standing AP pelvis shows a well-seated left total hip arthroplasty with no complicating features.  The right hip joint space is well-maintained.  At this point follow-up for his hip can be as needed.  He does develop any issues with the left hip or his right hip or other orthopedic issues he knows to reach out to us .

## 2024-05-19 ENCOUNTER — Encounter: Payer: Self-pay | Admitting: Dermatology

## 2024-05-19 ENCOUNTER — Ambulatory Visit: Admitting: Dermatology

## 2024-05-19 VITALS — BP 127/64 | HR 62 | Temp 97.9°F

## 2024-05-19 DIAGNOSIS — L814 Other melanin hyperpigmentation: Secondary | ICD-10-CM | POA: Diagnosis not present

## 2024-05-19 DIAGNOSIS — C4492 Squamous cell carcinoma of skin, unspecified: Secondary | ICD-10-CM

## 2024-05-19 DIAGNOSIS — L578 Other skin changes due to chronic exposure to nonionizing radiation: Secondary | ICD-10-CM

## 2024-05-19 DIAGNOSIS — D0439 Carcinoma in situ of skin of other parts of face: Secondary | ICD-10-CM | POA: Diagnosis not present

## 2024-05-19 NOTE — Progress Notes (Signed)
 Follow-Up Visit   Subjective  Aaron Ruiz is a 68 y.o. 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 Corey.   The following portions of the chart were reviewed this encounter and updated as appropriate: medications, allergies, medical history  Review of Systems:  No other skin or systemic complaints except as noted in HPI or Assessment and Plan.  Objective  Well appearing patient in no apparent distress; mood and affect are within normal limits.  A focused examination was performed of the following areas: Left superior forehead Relevant physical exam findings are noted in the Assessment and Plan.   Left Forehead Hyperkeratotic papule   Assessment & Plan   SQUAMOUS CELL CARCINOMA OF SKIN Left Forehead Mohs surgery  Consent obtained: written  Anticoagulation: Is the patient taking prescription anticoagulant and/or aspirin prescribed/recommended by a physician? Yes   Was the anticoagulation regimen changed prior to Mohs? No    Anesthesia: Anesthesia method: local infiltration Local anesthetic: lidocaine  1% WITH epi  Procedure Details: Timeout: pre-procedure verification complete Procedure Prep: patient was prepped and draped in usual sterile fashion Prep type: chlorhexidine  Biopsy accession number: [id number] Pre-Op  diagnosis: squamous cell carcinoma SCC subtype: in situ MohsAIQ Surgical site (if tumor spans multiple areas, please select predominant area): forehead (non-eyebrow) Surgery side: left Surgical site (from skin exam): Left Forehead Pre-operative length (cm): 0.5 Pre-operative width (cm): 0.5 Indications for Mohs surgery: anatomic location where tissue conservation is critical  Micrographic Surgery Details: Post-operative length (cm): 1.1 Post-operative width (cm): 1.1 Number of Mohs stages: 1 Post surgery depth of defect: subcutaneous fat  Stage 1    Tumor features  identified on Mohs section: no tumor identified  Reconstruction: Was the defect reconstructed? Yes   Was reconstruction performed by the same Mohs surgeon? Yes   Setting of reconstruction: outpatient office When was reconstruction performed? same day Type of reconstruction: linear Linear reconstruction: complex  Skin repair Complexity:  Complex Final length (cm):  3.8 Informed consent: discussed and consent obtained   Timeout: patient name, date of birth, surgical site, and procedure verified   Procedure prep:  Patient was prepped and draped in usual sterile fashion Prep type:  Chlorhexidine  Anesthesia: the lesion was anesthetized in a standard fashion   Anesthetic:  1% lidocaine  w/ epinephrine 1-100,000 buffered w/ 8.4% NaHCO3 Reason for type of repair: reduce tension to allow closure, preserve normal anatomical and functional relationships, avoid adjacent structures and allow side-to-side closure without requiring a flap or graft   Undermining: area extensively undermined   Subcutaneous layers (deep stitches):  Suture size:  5-0 Suture type: Monocryl (poliglecaprone 25)   Stitches:  Buried vertical mattress Fine/surface layer approximation (top stitches):  Suture size:  6-0 Suture type: fast-absorbing plain gut   Stitches: simple running   Hemostasis achieved with: suture, pressure and electrodesiccation Outcome: patient tolerated procedure well with no complications   Post-procedure details: sterile dressing applied and wound care instructions given   Dressing type: bandage and pressure dressing      Return if symptoms worsen or fail to improve.  [name], CMA, am acting as scribe for RUFUS CHRISTELLA COREY, MD.    05/19/2024  HISTORY OF PRESENT ILLNESS  Aaron Ruiz is seen in consultation at the request of Dr. 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 They note that the area has been present for about 6 months increasing in size with  time.  There is no history of previous treatment.  Reports no other new or changing lesions and has no other complaints today.  Medications and allergies: see patient chart.  Review of systems: Reviewed 8 systems and notable for the above skin cancer.  All other systems reviewed are unremarkable/negative, unless noted in the HPI. Past medical history, surgical history, family history, social history were also reviewed and are noted in the chart/questionnaire.    PHYSICAL EXAMINATION  General: Well-appearing, in no acute distress, alert and oriented x 4. Vitals reviewed in chart (if available).   Skin: Exam reveals a 0.5 x 0.5 cm erythematous papule and biopsy scar on the left forehead. There are rhytids, telangiectasias, and lentigines, consistent with photodamage.  Biopsy report(s) reviewed, confirming the diagnosis.   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 Due to location, size, histology, or recurrence and the likelihood of subclinical extension as well as the need to conserve normal surrounding tissue, the patient was deemed acceptable for Mohs micrographic surgery (MMS).  The nature and purpose of the procedure, associated benefits and risks including recurrence and scarring, possible complications such as pain, infection, and bleeding, and alternative methods of treatment if appropriate were discussed with the patient during consent. The lesion location was verified by the patient, by reviewing previous notes, pathology reports, and by photographs as well as angulation measurements if available.  Informed consent was reviewed and signed by the patient, and timeout was performed at 8:30 AM. See op note below.  2. For the photodamage and solar lentigines, sun protection discussed/information given on OTC sunscreens, and we recommend continued regular follow-up with primary dermatologist every 6 months or sooner for any  growing, bleeding, or changing lesions. 3. Prognosis and future surveillance discussed. 4. Letter with treatment outcome sent to referring provider. 5. Pain acetaminophen /ibuprofen  MOHS MICROGRAPHIC SURGERY AND RECONSTRUCTION  Initial size:   0.5 x 0.5 cm Surgical defect/wound size: 1.1 x 1.1 cm Anesthesia:    0.33% lidocaine  with 1:200,000 epinephrine EBL:    <5 mL Complications:  None Repair type:   Complex SQ suture:   5-0 Monocryl Cutaneous suture:  6-0 Plain gut Final size of the repair: 3.8 cm  Stages: 1  STAGE I: Anesthesia achieved with 0.5% lidocaine  with 1:200,000 epinephrine. ChloraPrep applied. 1 section(s) excised using Mohs technique (this includes total peripheral and deep tissue margin excision and evaluation with frozen sections, excised and interpreted by the same physician). The tumor was first debulked and then excised with an approx. 2mm margin.  Hemostasis was achieved with electrocautery as needed.  The specimen was then oriented, subdivided/relaxed, inked, and processed using Mohs technique.    Frozen section analysis revealed a clear deep and peripheral margin.  Reconstruction  The surgical wound was then cleaned, prepped, and re-anesthetized as above. Wound edges were undermined extensively along at least one entire edge and at a distance equal to or greater than the width of the defect (see wound defect size above) in order to achieve closure and decrease wound tension and anatomic distortion. Redundant tissue repair including standing cone removal was performed. Hemostasis was achieved with electrocautery. Subcutaneous and epidermal tissues were approximated with the above sutures. The surgical site was then lightly scrubbed with sterile, saline-soaked gauze. The area was then bandaged using Vaseline ointment, non-adherent gauze, gauze pads, and tape to provide an adequate pressure dressing. The patient tolerated the procedure well, was given detailed written  and  verbal wound care instructions, and was discharged in good condition.   The patient will follow-up: 4 weeks.    Documentation: I have reviewed the above documentation for accuracy and completeness, and I agree with the above.  [name], MD

## 2024-05-19 NOTE — Patient Instructions (Signed)

## 2024-05-30 ENCOUNTER — Other Ambulatory Visit: Payer: Self-pay | Admitting: Family

## 2024-05-30 DIAGNOSIS — I10 Essential (primary) hypertension: Secondary | ICD-10-CM

## 2024-05-30 DIAGNOSIS — N401 Enlarged prostate with lower urinary tract symptoms: Secondary | ICD-10-CM

## 2024-06-02 ENCOUNTER — Ambulatory Visit: Admitting: Dermatology

## 2024-06-02 ENCOUNTER — Encounter: Admitting: Dermatology

## 2024-06-02 ENCOUNTER — Encounter: Payer: Self-pay | Admitting: Dermatology

## 2024-06-02 VITALS — BP 135/60 | HR 68 | Temp 98.3°F

## 2024-06-02 DIAGNOSIS — L578 Other skin changes due to chronic exposure to nonionizing radiation: Secondary | ICD-10-CM

## 2024-06-02 DIAGNOSIS — L814 Other melanin hyperpigmentation: Secondary | ICD-10-CM

## 2024-06-02 DIAGNOSIS — L905 Scar conditions and fibrosis of skin: Secondary | ICD-10-CM

## 2024-06-02 DIAGNOSIS — C44311 Basal cell carcinoma of skin of nose: Secondary | ICD-10-CM

## 2024-06-02 DIAGNOSIS — C4492 Squamous cell carcinoma of skin, unspecified: Secondary | ICD-10-CM

## 2024-06-02 DIAGNOSIS — C4491 Basal cell carcinoma of skin, unspecified: Secondary | ICD-10-CM

## 2024-06-02 NOTE — Patient Instructions (Signed)

## 2024-06-02 NOTE — Progress Notes (Signed)
 Follow-Up Visit   Subjective  Aaron Ruiz is a 68 y.o. male who presents for the following: Mohs for a nodular basal cell carcinoma on the right ala nasi. Lesion was biopsied on 04/08/2024.  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  The following portions of the chart were reviewed this encounter and updated as appropriate: medications, allergies, medical history  Review of Systems:  No other skin or systemic complaints except as noted in HPI or Assessment and Plan.  Objective  Well appearing patient in no apparent distress; mood and affect are within normal limits.  A focused examination was performed of the following areas: Nose and forehead Relevant physical exam findings are noted in the Assessment and Plan.   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  - Call if any new or changing lesions are noted between office visits   Basal Cell Carcinoma, Nodular Exam: right cheek  Treatment Plan: Mohs appointment 06/16/2024  BASAL CELL CARCINOMA (BCC), UNSPECIFIED SITE Right Ala Nasi Mohs surgery  Consent obtained: written  Universal Protocol: Procedure explained and questions answered to patient or proxy's satisfaction: Yes   Test results  available and properly labeled: Yes   Pathology report reviewed: Yes   External notes reviewed: Yes   Photo or diagram used for site identification: Yes   Site/side marked: Yes   Slide independently reviewed by Mohs surgeon: Yes    Anticoagulation: Is the patient taking prescription anticoagulant and/or aspirin prescribed/recommended by a physician? Yes   Was the anticoagulation regimen changed prior to Mohs? No    Anesthesia: Anesthesia method: local infiltration Local anesthetic: lidocaine  1% WITH epi  Procedure Details: Timeout: pre-procedure verification complete Procedure Prep: patient was prepped and draped in usual sterile fashion Prep type: chlorhexidine  Biopsy accession number: [id number] Biopsy lab: [facility] Date of biopsy: 04/08/2024 Frozen section biopsy performed: No   Specimen debulked: No   Pre-Op  diagnosis: basal cell carcinoma BCC subtype: nodular MohsAIQ Surgical site (if tumor spans multiple areas, please select predominant area): nose Surgery side: right Surgical site (from skin exam): Right Ala Nasi Pre-operative length (cm): 0.5 Pre-operative width (cm): 0.5 Indications for Mohs surgery: anatomic location where tissue conservation is critical Previously treated? No    Micrographic Surgery Details: Post-operative length (cm): 0.8 Post-operative width (cm): 0.8 Number of Mohs stages: 1 Post surgery depth of defect: dermis and subcutaneous fat  Stage 1    Tumor features identified on Mohs section: no tumor identified  Patient tolerance of procedure: tolerated well, no immediate complications  Reconstruction: Was the defect reconstructed?: No    Opioids: Did the patient receive a prescription for opioid/narcotic related to Mohs surgery?: No    Antibiotics: Does patient meet AHA  guidelines for endocarditis?: No   Does patient meet AHA guidelines for orthopedic prophylaxis?: No   Were antibiotics given on the day of surgery?: No   Did  surgery breach mucosa, expose cartilage/bone, involve an area of lymphedema/inflamed/infected tissue? No      Return in about 4 weeks (around 06/30/2024) for Follow Up.  I, Rollene Gobble, RN, am acting as scribe for Aaron CHRISTELLA HOLY, MD .   06/02/2024  HISTORY OF PRESENT ILLNESS  Aaron Ruiz is seen in consultation at the request of Dr. Holy for biopsy-proven Nodular Basal Cell Carcinoma on the right nasal ala. They note that the area has been present for about 6 months increasing in size with time.  There is no history of previous treatment.  Reports no other new or changing lesions and has no other complaints today.  Medications and allergies: see patient chart.  Review of systems: Reviewed 8 systems and notable for the above skin cancer.  All other systems reviewed are unremarkable/negative, unless noted in the HPI. Past medical history, surgical history, family history, social history were also reviewed and are noted in the chart/questionnaire.    PHYSICAL EXAMINATION  General: Well-appearing, in no acute distress, alert and oriented x 4. Vitals reviewed in chart (if available).   Skin: Exam reveals a 0.5 x 0.5 cm erythematous papule and biopsy scar on the right nasal ala. There are rhytids, telangiectasias, and lentigines, consistent with photodamage.  Biopsy report(s) reviewed, confirming the diagnosis.   ASSESSMENT  1) Nodular Basal Cell Carcinoma on the right nasal ala 2) photodamage 3) solar lentigines   PLAN   1. Due to location, size, histology, or recurrence and the likelihood of subclinical extension as well as the need to conserve normal surrounding tissue, the patient was deemed acceptable for Mohs micrographic surgery (MMS).  The nature and purpose of the procedure, associated benefits and risks including recurrence and scarring, possible complications such as pain, infection, and bleeding, and alternative methods of treatment if appropriate were discussed with  the patient during consent. The lesion location was verified by the patient, by reviewing previous notes, pathology reports, and by photographs as well as angulation measurements if available.  Informed consent was reviewed and signed by the patient, and timeout was performed at 9:00 AM. See op note below.  2. For the photodamage and solar lentigines, sun protection discussed/information given on OTC sunscreens, and we recommend continued regular follow-up with primary dermatologist every 6 months or sooner for any growing, bleeding, or changing lesions. 3. Prognosis and future surveillance discussed. 4. Letter with treatment outcome sent to referring provider. 5. Pain acetaminophen /ibuprofen  MOHS MICROGRAPHIC SURGERY AND RECONSTRUCTION  Initial size:   0.5 x 0.5 cm Surgical defect/wound size: 0.8 x 0.8 cm Anesthesia:    0.33% lidocaine  with 1:200,000 epinephrine EBL:    <5 mL Complications:  None Repair type:   Second Intention  Stages: 1  STAGE I: Anesthesia achieved with 0.5% lidocaine  with 1:200,000 epinephrine. ChloraPrep applied. 1 section(s) excised using Mohs technique (this includes total peripheral and deep tissue margin excision and evaluation with frozen sections, excised and interpreted by the same physician). The tumor was first debulked and then excised with an approx. 2mm margin.  Hemostasis was achieved with electrocautery as needed.  The specimen was then oriented, subdivided/relaxed, inked, and processed using Mohs technique.    Frozen section analysis revealed a clear deep and peripheral margin.  Reconstruction  Patient was notified of results and repair options were discussed, including  second intention healing. After reviewing the advantages and disadvantages of each, we agreed on second intention healing as appropriate.   The surgical site was then lightly scrubbed with sterile, saline-soaked gauze.  The area was bandaged using Vaseline ointment, non-adherent gauze,  gauze pads, and tape to provide an adequate pressure dressing.   The patient tolerated the procedure well, was given detailed written and verbal wound care instructions, and was discharged in good condition.  The patient will follow-up in 4 weeks and as scheduled with primary dermatologist.     Documentation: I have reviewed the above documentation for accuracy and completeness, and I agree with the above.  Aaron CHRISTELLA HOLY, MD

## 2024-06-07 ENCOUNTER — Ambulatory Visit (HOSPITAL_COMMUNITY): Admission: RE | Admit: 2024-06-07 | Discharge: 2024-06-07 | Attending: Internal Medicine | Admitting: Internal Medicine

## 2024-06-07 ENCOUNTER — Encounter (HOSPITAL_COMMUNITY): Payer: Self-pay | Admitting: Internal Medicine

## 2024-06-07 VITALS — BP 110/50 | HR 57 | Ht 67.0 in | Wt 213.0 lb

## 2024-06-07 DIAGNOSIS — D6869 Other thrombophilia: Secondary | ICD-10-CM | POA: Diagnosis not present

## 2024-06-07 DIAGNOSIS — I480 Paroxysmal atrial fibrillation: Secondary | ICD-10-CM

## 2024-06-07 DIAGNOSIS — I4819 Other persistent atrial fibrillation: Secondary | ICD-10-CM

## 2024-06-07 DIAGNOSIS — Z5181 Encounter for therapeutic drug level monitoring: Secondary | ICD-10-CM

## 2024-06-07 NOTE — Progress Notes (Signed)
 Primary Care Physician: Daryl Setter, NP Primary Cardiologist: Dr Sheena Primary Electrophysiologist: Dr Inocencio Referring Physician: [facility]   Aaron Ruiz is a 68 y.o. male with a history of HTN, HLD, CAC score of 783, and atrial fibrillation who presents for follow up in the [facility]. The patient was initially diagnosed with atrial fibrillation 10/03/20 after presenting to [facility] ED with symptoms of tachypalpitations. He had just seen Dr Sheena who placed a cardiac monitor to screen for arrhythmias. Patient was started on Eliquis  for a CHADS2VASC score of 3. He was recently taken off of metoprolol  due to bradycardia, this was resumed at the ED. He denies any significant alcohol use but does admit to snoring and some daytime somnolence. Patient was seen by Dr Inocencio and started on flecainide . He eventually underwent afib ablation 02/08/22.   On follow up today, patient reports that he has done well since his ablation with no episodes of afib. He denies CP, swallowing pain, or groin issues. No bleeding issues on anticoagulation.   On follow up 12/09/22, patient is currently in atrial flutter. His ECG is abnormal and shows wide QRS complexes. He reports to be out of rhythm since this past Friday. He feels lightheaded and a little SOB with minimal exertion. He notes previously episodes were always brief and would convert to NSR quickly; over past couple of months episodes have seemed to last longer. Flecainide  increased to 150 mg BID on 6/3 per phone notes. He has not missed any doses of Eliquis .  On follow up 12/16/22, he is currently in atrial flutter. S/p successful DCCV on 6/13 with ERAF. He called on 6/14 noting HR 120-130s. Amlodipine  held and taking extra Toprol  for total of 50 mg daily.   On follow up 08/05/23, patient is currently in NSR. S/p Afib ablation on 07/08/23 by Dr. Inocencio. No episodes of Afib since ablation. He is doing great.  He is walking frequently and going to the gym. No chest pain or SOB. Leg sites healed without issue. No missed doses of Eliquis  5 mg BID.  On follow up 12/16/23, patient is currently in NSR. Patient contacted office noting increasing episodes of Afib and is tentatively scheduled for repeat ablation in September. He has noted 4 brief episodes of Afib in May and 4 so far in June. They last several minutes and he does not feel poorly during episodes. No missed doses of Eliquis .   On follow up 04/02/24, patient is currently in NSR. S/p Afib/flutter ablation on 03/05/24 by Dr. Inocencio. No episodes of Afib since ablation. He is taking amiodarone  200 mg daily. No chest pain or SOB. Leg sites healed without issue. No missed doses of anticoagulant.  On follow-up 06/07/2024, patient is currently in NSR.  He has had overall no A-fib burden since last office visit.  He is taking amiodarone  200 mg daily.  No bleeding issues on Eliquis .  Today, he denies symptoms of orthopnea, PND, lower extremity edema, dizziness, presyncope, syncope, snoring, daytime somnolence, bleeding, or neurologic sequela. The patient is tolerating medications without difficulties and is otherwise without complaint today.    Atrial Fibrillation Risk Factors:  he does have symptoms or diagnosis of sleep apnea. Patient declined sleep study. he does not have a history of rheumatic fever. he does not have a history of alcohol use. The patient does have a history of early familial atrial fibrillation or other arrhythmias. Sister has afib s/p ablation.  he has a  BMI of Body mass index is 33.36 kg/m.Aaron Ruiz Filed Weights   06/07/24 1049  Weight: 96.6 kg     Family History  Problem Relation Age of Onset   Coronary artery disease Father        CABG X5   Diabetes Father    Colon cancer Maternal Aunt    Cancer Brother        lung (smoker)   Hypertension Other    Kidney disease Other     Atrial Fibrillation Management history:  Previous  antiarrhythmic drugs: flecainide , amiodarone  Previous cardioversions: none Previous ablations: 02/08/22; 07/08/23, 03/05/24 CHADS2VASC score: 3 Anticoagulation history: Eliquis    Past Medical History:  Diagnosis Date   Actinic keratosis 03/20/2009   Qualifier: Diagnosis of  By: Georgian DO, D. Robert    Allergic conjunctivitis 06/30/2018   Anemia    ANEMIA-NOS 02/06/2009   Qualifier: Diagnosis of  By: Anice CMA, [name]     Asthma    uses inhaler   Basal cell carcinoma 2017   left side of nose   Basal cell carcinoma of nose 10/16/2020   Basal cell carcinoma of skin 04/08/2024   right cheek- NEEDS MOHS   Basal cell carcinoma of skin 04/08/2024   right ala nasi- tx Mohs Dr. Corey 12.3.2025   Borderline diabetes 05/03/2011   BPH (benign prostatic hyperplasia) 07/29/2014   Chemosis of conjunctiva of both eyes 01/15/2017   Colon polyps    Daytime somnolence 09/21/2020   Diverticulosis    Dysrhythmia    Essential hypertension 02/06/2009   Qualifier: Diagnosis of  By: Anice CMA, [name]      Fatigue 09/21/2020   Former smoker 09/21/2020   General medical examination 11/14/2010   GERD 02/06/2009   Qualifier: Diagnosis of  By: Anice CMA, [name]     GERD (gastroesophageal reflux disease)    on meds   High cholesterol 02/06/2009   Qualifier: Diagnosis of  By: Anice CMA, [name]     Hx of adenomatous colonic polyps 03/07/2009   Hx of eye surgery 2018   Left Eye was swollen and had procedure to remove excess swelling   Hyperglycemia 05/01/2011   Hyperlipidemia    Hypertension    Hypogonadism male 04/30/2011   Lactose intolerance    Lentigo 10/16/2020   Mild intermittent asthma 06/30/2018   Neoplasm of uncertain behavior of skin 10/16/2020   Obesity (BMI 30-39.9) 09/21/2020   OSA (obstructive sleep apnea) 11/22/2022   Osteoarthritis 09/24/2011   OVERWEIGHT 02/06/2009   Qualifier: Diagnosis of  By: [name]    Palpitations 09/21/2020   Paroxysmal atrial fibrillation  (HCC) 10/06/2020   Rash/urticaria 08/07/2012   Recurrent UTI 07/30/2013   Routine general medical examination at a health care facility 12/16/2011   Secondary hypercoagulable state 10/06/2020   SKIN LESION 03/20/2009   Qualifier: Diagnosis of  By: [name]    Snoring 09/21/2020   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   Left superior forehead- NEEDS MOHS   Unspecified asthma(493.90) 02/06/2009   Centricity Description: ASTHMA Qualifier: Diagnosis of  By: Anice CMA, [name]   Centricity Description: ASTHMA, INTERMITTENT, MILD Qualifier: Diagnosis of  By: Daryl FNP, [name] S    URI (upper respiratory infection) 07/23/2011   Vitamin D  deficiency 09/21/2020   Past Surgical History:  Procedure Laterality Date   ATRIAL FIBRILLATION ABLATION N/A 02/08/2022   Procedure: ATRIAL FIBRILLATION ABLATION;  Surgeon: Inocencio Soyla Lunger, MD;  Location: [facility];  Service: Cardiovascular;  Laterality: N/A;  ATRIAL FIBRILLATION ABLATION N/A 07/08/2023   Procedure: ATRIAL FIBRILLATION ABLATION;  Surgeon: Inocencio Soyla Lunger, MD;  Location: [facility];  Service: Cardiovascular;  Laterality: N/A;   ATRIAL FIBRILLATION ABLATION N/A 03/05/2024   Procedure: ATRIAL FIBRILLATION ABLATION;  Surgeon: Inocencio Soyla Lunger, MD;  Location: [facility];  Service: Cardiovascular;  Laterality: N/A;   CARDIOVERSION N/A 12/12/2022   Procedure: CARDIOVERSION;  Surgeon: Shlomo Wilbert SAUNDERS, MD;  Location: [facility];  Service: Cardiovascular;  Laterality: N/A;   CYSTECTOMY  05/2004   removed fromback /sebaceous cyst   INGUINAL HERNIA REPAIR  1995   left   mohs procedure  2017   left side of nose   NASAL SEPTUM SURGERY     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   Procedure: LEFT TOTAL HIP ARTHROPLASTY ANTERIOR APPROACH;  Surgeon: Vernetta Lonni GRADE, MD;  Location: [facility] OR;  Service: Orthopedics;  Laterality: Left;    Current Outpatient Medications  Medication Sig  Dispense Refill   acetaminophen  (TYLENOL ) 500 MG tablet Take 1 tablet (500 mg total) by mouth every 6 (six) hours as needed. 30 tablet 5   albuterol  (VENTOLIN  HFA) 108 (90 Base) MCG/ACT inhaler Inhale 2 puffs into the lungs every 6 (six) hours as needed for wheezing or shortness of breath. 8 g 0   amLODipine  (NORVASC ) 10 MG tablet Take 1 tablet (10 mg total) by mouth daily. 90 tablet 0   apixaban  (ELIQUIS ) 5 MG TABS tablet TAKE 1 TABLET(5 MG) BY MOUTH TWICE DAILY 180 tablet 1   Ascorbic Acid (VITAMIN C PO) Take 1 packet by mouth daily. Taking emergen vitamin C - mix in water- He takes daily     cetirizine (ZYRTEC) 10 MG tablet Take 10 mg by mouth daily.     Cholecalciferol  (VITAMIN D ) 50 MCG (2000 UT) tablet Take 2,000 Units by mouth daily.     diphenhydrAMINE  (BENADRYL ) 25 MG tablet Take 25 mg by mouth at bedtime.     fluticasone  (FLONASE ) 50 MCG/ACT nasal spray Place 2 sprays into both nostrils daily as needed for allergies or rhinitis.     GLUCOSAMINE-CHONDROITIN PO Take 1 tablet by mouth daily.     guaiFENesin (MUCINEX) 600 MG 12 hr tablet Take 600 mg by mouth daily as needed for to loosen phlegm or cough.     losartan -hydrochlorothiazide  (HYZAAR) 100-25 MG tablet TAKE 1 TABLET BY MOUTH DAILY 90 tablet 0   metoprolol  succinate (TOPROL -XL) 25 MG 24 hr tablet TAKE 1 TABLET BY MOUTH DAILY 90 tablet 0   naphazoline-glycerin  (CLEAR EYES REDNESS) 0.012-0.25 % SOLN Place 1-2 drops into both eyes 4 (four) times daily as needed for eye irritation.     NON FORMULARY Pt uses c-pap nightly     Omega-3 Fatty Acids  (FISH OIL) 1200 MG CAPS Take 1,200 mg by mouth daily.     omeprazole  (PRILOSEC) 40 MG capsule TAKE 1 CAPSULE(40 MG) BY MOUTH DAILY 90 capsule 1   pravastatin  (PRAVACHOL ) 40 MG tablet TAKE 1 TABLET(40 MG) BY MOUTH DAILY 90 tablet 1   sildenafil  (REVATIO ) 20 MG tablet 1-2 tabs by mouth prior to sexual activity 30 tablet 5   tamsulosin  (FLOMAX ) 0.4 MG CAPS capsule TAKE 1 CAPSULE(0.4 MG) BY MOUTH  DAILY 90 capsule 0   triamcinolone  ointment (KENALOG ) 0.1 % Apply 1 Application topically daily as needed. 80 g 3   Turmeric Curcumin 500 MG CAPS Take 500 mg by mouth daily.     No current facility-administered medications for this encounter.  Allergies  Allergen Reactions   Iodinated Contrast Media Itching    Needs to be Pre-medicated - Severe Itching    Sulfonamide Derivatives     Hallucination (Childhood Reaction)   ROS- All systems are reviewed and negative except as per the HPI above.  Physical Exam: Vitals:   06/07/24 1049  BP: (!) 110/50  Pulse: (!) 57  Weight: 96.6 kg  Height: 5' 7 (1.702 m)    GEN- The patient is well appearing, alert and oriented x 3 today.   Neck - no JVD or carotid bruit noted Lungs- Clear to ausculation bilaterally, normal work of breathing Heart- Regular rate and rhythm, no murmurs, rubs or gallops, PMI not laterally displaced Extremities- no clubbing, cyanosis, or edema Skin - no rash or ecchymosis noted   Wt Readings from Last 3 Encounters:  06/07/24 96.6 kg  04/02/24 96.2 kg  03/05/24 95.3 kg    EKG today demonstrates  EKG Interpretation Date/Time:  Monday June 07 2024 10:51:18 EST Ventricular Rate:  57 PR Interval:  174 QRS Duration:  90 QT Interval:  448 QTC Calculation: 436 R Axis:   32  Text Interpretation: Sinus bradycardia Otherwise normal ECG When compared with ECG of 02-Apr-2024 10:41, No significant change was found Confirmed by Terra Pac ([id number]) on 06/07/2024 10:57:19 AM    Echo 11/01/20 1. Left ventricular ejection fraction, by estimation, is 60 to 65%. The  left ventricle has normal function. The left ventricle has no regional  wall motion abnormalities. Left ventricular diastolic parameters were  normal.   2. Right ventricular systolic function is normal. The right ventricular  size is normal.   3. The mitral valve is normal in structure. Trivial mitral valve  regurgitation. No evidence of mitral  stenosis.   4. The aortic valve is normal in structure. Aortic valve regurgitation is  not visualized. No aortic stenosis is present.   5. The inferior vena cava is normal in size with greater than 50%  respiratory variability, suggesting right atrial pressure of 3 mmHg.   Epic records are reviewed at length today  CHA2DS2-VASc Score = 3  The patient's score is based upon: CHF History: 0 HTN History: 1 Diabetes History: 0 Stroke History: 0 Vascular Disease History: 1 Age Score: 1 Gender Score: 0       ASSESSMENT AND PLAN: Persistent Atrial Fibrillation / Flutter (ICD10:  I48.0) The patient's CHA2DS2-VASc score is 3, indicating a 3.2% annual risk of stroke.   S/p afib ablation 02/08/22 S/p DCCV on 12/12/22 S/p Afib ablation on 07/08/23 by Dr. Inocencio. S/p Afib ablation on 03/05/24 by Dr. Inocencio.   Patient is currently in NSR.  After discussion with patient, will stop amiodarone  today.  Continue Toprol  25 mg daily.  Secondary Hypercoagulable State (ICD10:  D68.69) The patient is at significant risk for stroke/thromboembolism based upon his CHA2DS2-VASc Score of 3.  Continue Apixaban  (Eliquis ).  No missed doses.  HTN Stable today.    Follow up with Dr. Inocencio in 6 months.   Pac Terra, PA-C [facility] [address] [place], [place] [address] [phone] 06/07/2024 11:04 AM

## 2024-06-09 ENCOUNTER — Encounter: Payer: Self-pay | Admitting: Dermatology

## 2024-06-14 ENCOUNTER — Other Ambulatory Visit: Payer: Self-pay | Admitting: Family

## 2024-06-14 DIAGNOSIS — I10 Essential (primary) hypertension: Secondary | ICD-10-CM

## 2024-06-14 NOTE — Telephone Encounter (Signed)
 Please contact pt to schedule follow up in February

## 2024-06-15 ENCOUNTER — Ambulatory Visit: Payer: Medicare Other

## 2024-06-15 VITALS — BP 124/64 | HR 67 | Temp 98.6°F | Ht 67.0 in | Wt 213.0 lb

## 2024-06-15 DIAGNOSIS — Z0000 Encounter for general adult medical examination without abnormal findings: Secondary | ICD-10-CM

## 2024-06-15 NOTE — Progress Notes (Signed)
 Chief Complaint  Patient presents with   Medicare Wellness     Subjective:   MARCQUES WRIGHTSMAN is a 68 y.o. male who presents for a Medicare Annual Wellness Visit.  Visit info / Clinical Intake: Medicare Wellness Visit Type:: Subsequent Annual Wellness Visit Persons participating in visit and providing information:: patient Medicare Wellness Visit Mode:: In-person (required for WTM) Interpreter Needed?: No Pre-visit prep was completed: yes AWV questionnaire completed by patient prior to visit?: yes Date:: 06/08/24 Living arrangements:: lives with spouse/significant other Patient's Overall Health Status Rating: very good Typical amount of pain: none Does pain affect daily life?: no Are you currently prescribed opioids?: no  Dietary Habits and Nutritional Risks How many meals a day?: 3 Eats fruit and vegetables daily?: yes Most meals are obtained by: preparing own meals In the last 2 weeks, have you had any of the following?: none Diabetic:: no  Functional Status Activities of Daily Living (to include ambulation/medication): Independent Ambulation: Independent with device- listed below Home Assistive Devices/Equipment: Eyeglasses; Contact lenses; Other (Comment) (Hearing Aids) Medication Administration: Independent Home Management (perform basic housework or laundry): Independent Manage your own finances?: yes Primary transportation is: driving Concerns about vision?: no *vision screening is required for WTM* Concerns about hearing?: (!) yes Uses hearing aids?: (!) yes Hear whispered voice?: yes  Fall Screening Falls in the past year?: 0 Number of falls in past year: 0 Was there an injury with Fall?: 0 Fall Risk Category Calculator: 0 Patient Fall Risk Level: Low Fall Risk  Fall Risk Patient at Risk for Falls Due to: No Fall Risks Fall risk Follow up: Falls evaluation completed  Home and Transportation Safety: All rugs have non-skid backing?: N/A, no rugs All  stairs or steps have railings?: yes Grab bars in the bathtub or shower?: yes Have non-skid surface in bathtub or shower?: yes Good home lighting?: yes Regular seat belt use?: yes Hospital stays in the last year:: no  Cognitive Assessment Difficulty concentrating, remembering, or making decisions? : no Will 6CIT or Mini Cog be Completed: yes What year is it?: 0 points What month is it?: 0 points Give patient an address phrase to remember (5 components): [address] [place]  About what time is it?: 0 points Count backwards from 20 to 1: 0 points Say the months of the year in reverse: 0 points Repeat the address phrase from earlier: 0 points 6 CIT Score: 0 points  Advance Directives (For Healthcare) Does Patient Have a Medical Advance Directive?: Yes Does patient want to make changes to medical advance directive?: No - Patient declined Type of Advance Directive: Healthcare Power of [place]; Living will Copy of Healthcare Power of Attorney in Chart?: No - copy requested Copy of Living Will in Chart?: No - copy requested  Reviewed/Updated  Reviewed/Updated: Reviewed All (Medical, Surgical, Family, Medications, Allergies, Care Teams, Patient Goals)    Allergies (verified) Iodinated contrast media and Sulfonamide derivatives   Current Medications (verified) Outpatient Encounter Medications as of 06/15/2024  Medication Sig   acetaminophen  (TYLENOL ) 500 MG tablet Take 1 tablet (500 mg total) by mouth every 6 (six) hours as needed.   albuterol  (VENTOLIN  HFA) 108 (90 Base) MCG/ACT inhaler Inhale 2 puffs into the lungs every 6 (six) hours as needed for wheezing or shortness of breath.   amLODipine  (NORVASC ) 10 MG tablet TAKE 1 TABLET(10 MG) BY MOUTH DAILY   apixaban  (ELIQUIS ) 5 MG TABS tablet TAKE 1 TABLET(5 MG) BY MOUTH TWICE DAILY   Ascorbic Acid (VITAMIN C  PO) Take 1 packet by mouth daily. Taking emergen vitamin C - mix in water- He takes daily   cetirizine (ZYRTEC) 10 MG  tablet Take 10 mg by mouth daily.   Cholecalciferol  (VITAMIN D ) 50 MCG (2000 UT) tablet Take 2,000 Units by mouth daily.   diphenhydrAMINE  (BENADRYL ) 25 MG tablet Take 25 mg by mouth at bedtime.   fluticasone  (FLONASE ) 50 MCG/ACT nasal spray Place 2 sprays into both nostrils daily as needed for allergies or rhinitis.   GLUCOSAMINE-CHONDROITIN PO Take 1 tablet by mouth daily.   guaiFENesin (MUCINEX) 600 MG 12 hr tablet Take 600 mg by mouth daily as needed for to loosen phlegm or cough.   losartan -hydrochlorothiazide  (HYZAAR) 100-25 MG tablet TAKE 1 TABLET BY MOUTH DAILY   metoprolol  succinate (TOPROL -XL) 25 MG 24 hr tablet TAKE 1 TABLET BY MOUTH DAILY   naphazoline-glycerin  (CLEAR EYES REDNESS) 0.012-0.25 % SOLN Place 1-2 drops into both eyes 4 (four) times daily as needed for eye irritation.   NON FORMULARY Pt uses c-pap nightly   Omega-3 Fatty Acids  (FISH OIL) 1200 MG CAPS Take 1,200 mg by mouth daily.   omeprazole  (PRILOSEC) 40 MG capsule TAKE 1 CAPSULE(40 MG) BY MOUTH DAILY   pravastatin  (PRAVACHOL ) 40 MG tablet TAKE 1 TABLET(40 MG) BY MOUTH DAILY   sildenafil  (REVATIO ) 20 MG tablet 1-2 tabs by mouth prior to sexual activity   tamsulosin  (FLOMAX ) 0.4 MG CAPS capsule TAKE 1 CAPSULE(0.4 MG) BY MOUTH DAILY   triamcinolone  ointment (KENALOG ) 0.1 % Apply 1 Application topically daily as needed.   Turmeric Curcumin 500 MG CAPS Take 500 mg by mouth daily.   No facility-administered encounter medications on file as of 06/15/2024.    History: Past Medical History:  Diagnosis Date   Actinic keratosis 03/20/2009   Qualifier: Diagnosis of  By: Georgian DO, D. Robert    Allergic conjunctivitis 06/30/2018   Anemia    ANEMIA-NOS 02/06/2009   Qualifier: Diagnosis of  By: Anice CMA, [name]     Asthma    uses inhaler   Basal cell carcinoma 2017   left side of nose   Basal cell carcinoma of nose 10/16/2020   Basal cell carcinoma of skin 04/08/2024   right cheek- NEEDS MOHS   Basal cell carcinoma of  skin 04/08/2024   right ala nasi- tx Mohs Dr. Corey 12.3.2025   Borderline diabetes 05/03/2011   BPH (benign prostatic hyperplasia) 07/29/2014   Chemosis of conjunctiva of both eyes 01/15/2017   Colon polyps    Daytime somnolence 09/21/2020   Diverticulosis    Dysrhythmia    Essential hypertension 02/06/2009   Qualifier: Diagnosis of  By: Anice CMA, [name]      Fatigue 09/21/2020   Former smoker 09/21/2020   General medical examination 11/14/2010   GERD 02/06/2009   Qualifier: Diagnosis of  By: Anice CMA, [name]     GERD (gastroesophageal reflux disease)    on meds   High cholesterol 02/06/2009   Qualifier: Diagnosis of  By: Anice CMA, [name]     Hx of adenomatous colonic polyps 03/07/2009   Hx of eye surgery 2018   Left Eye was swollen and had procedure to remove excess swelling   Hyperglycemia 05/01/2011   Hyperlipidemia    Hypertension    Hypogonadism male 04/30/2011   Lactose intolerance    Lentigo 10/16/2020   Mild intermittent asthma 06/30/2018   Neoplasm of uncertain behavior of skin 10/16/2020   Obesity (BMI 30-39.9) 09/21/2020   OSA (obstructive sleep apnea) 11/22/2022  Osteoarthritis 09/24/2011   OVERWEIGHT 02/06/2009   Qualifier: Diagnosis of  By: [name]    Palpitations 09/21/2020   Paroxysmal atrial fibrillation (HCC) 10/06/2020   Rash/urticaria 08/07/2012   Recurrent UTI 07/30/2013   Routine general medical examination at a health care facility 12/16/2011   Secondary hypercoagulable state 10/06/2020   SKIN LESION 03/20/2009   Qualifier: Diagnosis of  By: [name]    Snoring 09/21/2020   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   Left superior forehead-tx Mohs Dr. Corey 05/19/2024   Unspecified asthma(493.90) 02/06/2009   Centricity Description: ASTHMA Qualifier: Diagnosis of  By: Anice CMA, [name]   Centricity Description: ASTHMA, INTERMITTENT, MILD Qualifier: Diagnosis of  By: Daryl FNP, [name] S    URI (upper  respiratory infection) 07/23/2011   Vitamin D  deficiency 09/21/2020   Past Surgical History:  Procedure Laterality Date   ATRIAL FIBRILLATION ABLATION N/A 02/08/2022   Procedure: ATRIAL FIBRILLATION ABLATION;  Surgeon: Inocencio Soyla Lunger, MD;  Location: [facility];  Service: Cardiovascular;  Laterality: N/A;   ATRIAL FIBRILLATION ABLATION N/A 07/08/2023   Procedure: ATRIAL FIBRILLATION ABLATION;  Surgeon: Inocencio Soyla Lunger, MD;  Location: [facility];  Service: Cardiovascular;  Laterality: N/A;   ATRIAL FIBRILLATION ABLATION N/A 03/05/2024   Procedure: ATRIAL FIBRILLATION ABLATION;  Surgeon: Inocencio Soyla Lunger, MD;  Location: [facility];  Service: Cardiovascular;  Laterality: N/A;   CARDIOVERSION N/A 12/12/2022   Procedure: CARDIOVERSION;  Surgeon: Shlomo Wilbert SAUNDERS, MD;  Location: [facility];  Service: Cardiovascular;  Laterality: N/A;   CYSTECTOMY  05/2004   removed fromback /sebaceous cyst   INGUINAL HERNIA REPAIR  1995   left   mohs procedure  2017   left side of nose   NASAL SEPTUM SURGERY     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   Procedure: LEFT TOTAL HIP ARTHROPLASTY ANTERIOR APPROACH;  Surgeon: Vernetta Lonni GRADE, MD;  Location: [facility];  Service: Orthopedics;  Laterality: Left;   Family History  Problem Relation Age of Onset   Coronary artery disease Father        CABG X5   Diabetes Father    Colon cancer Maternal Aunt    Cancer Brother        lung (smoker)   Hypertension Other    Kidney disease Other    Social History   Occupational History   Not on file  Tobacco Use   Smoking status: Former    Current packs/day: 0.00    Types: Cigarettes    Start date: 07/01/1969    Quit date: 07/01/1974    Years since quitting: 49.9   Smokeless tobacco: Never   Tobacco comments:    Former smoker 03/08/22  Vaping Use   Vaping status: Never Used  Substance and Sexual Activity   Alcohol use: No    Alcohol/week: 0.0 standard drinks of alcohol    Drug use: No   Sexual activity: Not on file   Tobacco Counseling Counseling given: No Tobacco comments: Former smoker 03/08/22  SDOH Screenings   Food Insecurity: No Food Insecurity (06/15/2024)  Housing: Unknown (06/15/2024)  Transportation Needs: No Transportation Needs (06/15/2024)  Utilities: Not At Risk (06/15/2024)  Depression (PHQ2-9): Low Risk (06/15/2024)  Financial Resource Strain: Low Risk (06/08/2024)  Physical Activity: Sufficiently Active (06/15/2024)  Social Connections: Socially Integrated (06/15/2024)  Stress: No Stress Concern Present (06/15/2024)  Tobacco Use: Medium Risk (06/15/2024)  Health Literacy: Adequate Health Literacy (06/15/2024)   See  flowsheets for full screening details  Depression Screen PHQ 2 & 9 Depression Scale- Over the past 2 weeks, how often have you been bothered by any of the following problems? Little interest or pleasure in doing things: 0 Feeling down, depressed, or hopeless (PHQ Adolescent also includes...irritable): 0 PHQ-2 Total Score: 0     Goals Addressed               This Visit's Progress     Remain active (pt-stated)               Objective:    Today's Vitals   06/15/24 0918  BP: 124/64  Pulse: 67  Temp: 98.6 F (37 C)  TempSrc: Oral  SpO2: 97%  Weight: 213 lb (96.6 kg)  Height: 5' 7 (1.702 m)   Body mass index is 33.36 kg/m.  Hearing/Vision screen Hearing Screening - Comments:: Wears Hearing Aids Vision Screening - Comments:: Wears rx glasses - up to date with routine eye exams with [facility]  Immunizations and Health Maintenance Health Maintenance  Topic Date Due   Colonoscopy  10/26/2024   Medicare Annual Wellness (AWV)  06/15/2025   DTaP/Tdap/Td (3 - Td or Tdap) 03/29/2029   Pneumococcal Vaccine: 50+ Years  Completed   Influenza Vaccine  Completed   Hepatitis C Screening  Completed   Zoster Vaccines- Shingrix   Completed   Meningococcal B Vaccine  Aged Out   COVID-19 Vaccine   Discontinued        Assessment/Plan:  This is a routine wellness examination for Josie.  Patient Care Team: Daryl Setter, NP as PCP - General (Internal Medicine) [name], [name], DO as PCP - Cardiology (Cardiology) Inocencio Soyla Lunger, MD as PCP - Electrophysiology (Cardiology)  I have personally reviewed and noted the following in the patients chart:   Medical and social history Use of alcohol, tobacco or illicit drugs  Current medications and supplements including opioid prescriptions. Functional ability and status Nutritional status Physical activity Advanced directives List of other physicians Hospitalizations, surgeries, and ER visits in previous 12 months Vitals Screenings to include cognitive, depression, and falls Referrals and appointments  No orders of the defined types were placed in this encounter.  In addition, I have reviewed and discussed with patient certain preventive protocols, quality metrics, and best practice recommendations. A written personalized care plan for preventive services as well as general preventive health recommendations were provided to patient.   Rojelio LELON Blush, LPN   [id number]   Return in 53 weeks (on 06/21/2025).  After Visit Summary: (In Person-Declined) Patient declined AVS at this time.  Nurse Notes: No voiced or noted concerns at this time

## 2024-06-15 NOTE — Patient Instructions (Addendum)
 Aaron Ruiz,  Thank you for taking the time for your Medicare Wellness Visit. I appreciate your continued commitment to your health goals. Please review the care plan we discussed, and feel free to reach out if I can assist you further.  Please note that Annual Wellness Visits do not include a physical exam. Some assessments may be limited, especially if the visit was conducted virtually. If needed, we may recommend an in-person follow-up with your provider.  Ongoing Care Seeing your primary care provider every 3 to 6 months helps us  monitor your health and provide consistent, personalized care.   Referrals If a referral was made during today's visit and you haven't received any updates within two weeks, please contact the referred provider directly to check on the status.  Recommended Screenings:  Health Maintenance  Topic Date Due   Colon Cancer Screening  10/26/2024   Medicare Annual Wellness Visit  06/15/2025   DTaP/Tdap/Td vaccine (3 - Td or Tdap) 03/29/2029   Pneumococcal Vaccine for age over 62  Completed   Flu Shot  Completed   Hepatitis C Screening  Completed   Zoster (Shingles) Vaccine  Completed   Meningitis B Vaccine  Aged Out   COVID-19 Vaccine  Discontinued       06/15/2024    9:25 AM  Advanced Directives  Does Patient Have a Medical Advance Directive? Yes  Type of Estate Agent of [place];Living will  Does patient want to make changes to medical advance directive? No - Patient declined  Copy of Healthcare Power of Attorney in Chart? No - copy requested    Vision: Annual vision screenings are recommended for early detection of glaucoma, cataracts, and diabetic retinopathy. These exams can also reveal signs of chronic conditions such as diabetes and high blood pressure.  Dental: Annual dental screenings help detect early signs of oral cancer, gum disease, and other conditions linked to overall health, including heart disease and  diabetes.  Please see the attached documents for additional preventive care recommendations.

## 2024-06-16 ENCOUNTER — Encounter: Payer: Self-pay | Admitting: Dermatology

## 2024-06-16 ENCOUNTER — Ambulatory Visit: Admitting: Dermatology

## 2024-06-16 VITALS — BP 130/63 | HR 60 | Temp 98.7°F

## 2024-06-16 DIAGNOSIS — L814 Other melanin hyperpigmentation: Secondary | ICD-10-CM | POA: Diagnosis not present

## 2024-06-16 DIAGNOSIS — L578 Other skin changes due to chronic exposure to nonionizing radiation: Secondary | ICD-10-CM

## 2024-06-16 DIAGNOSIS — C4491 Basal cell carcinoma of skin, unspecified: Secondary | ICD-10-CM

## 2024-06-16 DIAGNOSIS — S0120XA Unspecified open wound of nose, initial encounter: Secondary | ICD-10-CM | POA: Diagnosis not present

## 2024-06-16 DIAGNOSIS — C44319 Basal cell carcinoma of skin of other parts of face: Secondary | ICD-10-CM

## 2024-06-16 DIAGNOSIS — T1490XD Injury, unspecified, subsequent encounter: Secondary | ICD-10-CM

## 2024-06-16 NOTE — Patient Instructions (Signed)

## 2024-06-16 NOTE — Progress Notes (Signed)
 Follow-Up Visit   Subjective  Aaron Ruiz is a 68 y.o. 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 Corey.   He is s/p Mohs on the right nasal ala for a BCC on 06/02/2024, healing by second intention.   The following portions of the chart were reviewed this encounter and updated as appropriate: medications, allergies, medical history  Review of Systems:  No other skin or systemic complaints except as noted in HPI or Assessment and Plan.  Objective  Well appearing patient in no apparent distress; mood and affect are within normal limits.  A focused examination was performed of the following areas: Right cheek Right nose Relevant physical exam findings are noted in the Assessment and Plan.   Right Cheek Pink nodule   Assessment & Plan   Healing wound s/p Mohs for [employer] on the right nasal ala, treated on 06/02/2024, healing by second intention - Reassured that wound is healing well - Discussed that scars take up to 12 months to mature from the date of surgery - Recommend SPF 30+ to scar daily to prevent purple color - OK to start scar massage at 4-6 weeks post-op - Can consider silicone based products for scar healing - Recommend continue ointment for another 3 weeks daily under a bandage  HISTORY OF BASAL CELL CARCINOMA OF THE SKIN - No evidence of recurrence today - Recommend regular full body skin exams - Recommend daily broad spectrum sunscreen SPF 30+ to sun-exposed areas, reapply every 2 hours as needed.  - Call if any new or changing lesions are noted between office visits  Wound Bed Preparation The patient is being prepared for a potential delayed skin graft . The wound bed is thoroughly assessed and prepared to optimize graft take and healing. The necrotic tissue is debrided with a sharp curette, with the help of forceps and a curved iris to create a clean, viable base, and any infected tissue is  removed to reduce the risk of graft failure. The depth of the wound preparation extended to the dermis. Hemostasis is achieved to prevent bleeding complications during the grafting process. The wound edges are carefully undermined, and the underlying tissues are inspected for adequate vascularity to support the graft. The wound is then thoroughly irrigated, and the area is prepped with antiseptic solutions to minimize the risk of infection. Once the wound bed is adequately prepared, it is covered with a moist dressing in preparation for the skin graft placement.  Wound Size 0.7 x 0.7 cm BASAL CELL CARCINOMA (BCC), UNSPECIFIED SITE Right Cheek - Mohs surgery  Consent obtained: written  Anticoagulation: Was the anticoagulation regimen changed prior to Mohs? No    Anesthesia: Anesthesia method: local infiltration Local anesthetic: lidocaine  1% WITH epi  Procedure Details: Timeout: pre-procedure verification complete Procedure Prep: patient was prepped and draped in usual sterile fashion Prep type: chlorhexidine  Biopsy accession number: [id number] Pre-Op  diagnosis: basal cell carcinoma BCC subtype: nodular MohsAIQ Surgical site (if tumor spans multiple areas, please select predominant area): cheek (including jawline) Surgery side: right Surgical site (from skin exam): Right Cheek Pre-operative length (cm): 0.6 Pre-operative width (cm): 0.6 Indications for Mohs surgery: anatomic location where tissue conservation is critical  Micrographic Surgery Details: Post-operative length (cm): 1.5 Post-operative width (cm): 1.3 Number of Mohs stages: 1 Post surgery depth of defect: dermis  Stage 1    Tumor features identified on Mohs section: no tumor identified  Reconstruction: Was  the defect reconstructed? Yes   Was reconstruction performed by the same Mohs surgeon? Yes   Setting of reconstruction: outpatient office When was reconstruction performed? same day Type of reconstruction:  linear Linear reconstruction: complex  - Skin repair Complexity:  Complex Final length (cm):  4.2 Informed consent: discussed and consent obtained   Timeout: patient name, date of birth, surgical site, and procedure verified   Procedure prep:  Patient was prepped and draped in usual sterile fashion Prep type:  Chlorhexidine  Anesthesia: the lesion was anesthetized in a standard fashion   Anesthetic:  1% lidocaine  w/ epinephrine 1-100,000 buffered w/ 8.4% NaHCO3 Reason for type of repair: reduce tension to allow closure, allow closure of the large defect and preserve normal anatomy   Undermining: area extensively undermined   Subcutaneous layers (deep stitches):  Suture size:  5-0 Suture type: Monocryl (poliglecaprone 25)   Stitches:  Buried vertical mattress Fine/surface layer approximation (top stitches):  Suture size:  6-0 Suture type: fast-absorbing plain gut   Stitches: simple running   Hemostasis achieved with: suture, pressure and electrodesiccation Outcome: patient tolerated procedure well with no complications   Post-procedure details: sterile dressing applied and wound care instructions given   Dressing type: bandage and pressure dressing    HEALING WOUND     Return in about 2 weeks (around 06/30/2024) for healing wound.  Aaron Ruiz, CMA, am acting as scribe for Aaron CHRISTELLA HOLY, MD.    06/16/2024  HISTORY OF PRESENT ILLNESS  Aaron Ruiz is seen in consultation at the request of Dr. Holy for biopsy-proven Nodular Basal Cell Carcinoma of the right cheek. They note that the area has been present for about 6 months increasing in size with time.  There is no history of previous treatment.  Reports no other new or changing lesions and has no other complaints today.  Medications and allergies: see patient chart.  Review of systems: Reviewed 8 systems and notable for the above skin cancer.  All other systems reviewed are unremarkable/negative, unless noted in the  HPI. Past medical history, surgical history, family history, social history were also reviewed and are noted in the chart/questionnaire.    PHYSICAL EXAMINATION  General: Well-appearing, in no acute distress, alert and oriented x 4. Vitals reviewed in chart (if available).   Skin: Exam reveals a 0.6 x 0.6 cm erythematous papule and biopsy scar on the right cheek. There are rhytids, telangiectasias, and lentigines, consistent with photodamage.   Biopsy report(s) reviewed, confirming the diagnosis.   ASSESSMENT  1) Nodular Basal Cell Carcinoma of the right cheek 2) photodamage 3) solar lentigines   PLAN   1. Due to location, size, histology, or recurrence and the likelihood of subclinical extension as well as the need to conserve normal surrounding tissue, the patient was deemed acceptable for Mohs micrographic surgery (MMS).  The nature and purpose of the procedure, associated benefits and risks including recurrence and scarring, possible complications such as pain, infection, and bleeding, and alternative methods of treatment if appropriate were discussed with the patient during consent. The lesion location was verified by the patient, by reviewing previous notes, pathology reports, and by photographs as well as angulation measurements if available.  Informed consent was reviewed and signed by the patient, and timeout was performed at 9:30 AM. See op note below.  2. For the photodamage and solar lentigines, sun protection discussed/information given on OTC sunscreens, and we recommend continued regular follow-up with primary dermatologist every 6 months or sooner for any  growing, bleeding, or changing lesions. 3. Prognosis and future surveillance discussed. 4. Letter with treatment outcome sent to referring provider. 5. Pain acetaminophen /ibuprofen  MOHS MICROGRAPHIC SURGERY AND RECONSTRUCTION  Initial size:   0.6 x 0.6 cm Surgical defect/wound size: 1.5 x 1.3 cm Anesthesia:    0.33%  lidocaine  with 1:200,000 epinephrine EBL:    <5 mL Complications:  None Repair type:   Complex SQ suture:   5-0 Monocryl Cutaneous suture:  6-0 Plain gut Final size of the repair: 4.2 cm  Stages: 1  STAGE I: Anesthesia achieved with 0.5% lidocaine  with 1:200,000 epinephrine. ChloraPrep applied. 1 section(s) excised using Mohs technique (this includes total peripheral and deep tissue margin excision and evaluation with frozen sections, excised and interpreted by the same physician). The tumor was first debulked and then excised with an approx. 2mm margin.  Hemostasis was achieved with electrocautery as needed.  The specimen was then oriented, subdivided/relaxed, inked, and processed using Mohs technique.    Frozen section analysis revealed a clear deep and peripheral margin.  Reconstruction  The surgical wound was then cleaned, prepped, and re-anesthetized as above. Wound edges were undermined extensively along at least one entire edge and at a distance equal to or greater than the width of the defect (see wound defect size above) in order to achieve closure and decrease wound tension and anatomic distortion. Redundant tissue repair including standing cone removal was performed. Hemostasis was achieved with electrocautery. Subcutaneous and epidermal tissues were approximated with the above sutures. The surgical site was then lightly scrubbed with sterile, saline-soaked gauze. The area was then bandaged using Vaseline ointment, non-adherent gauze, gauze pads, and tape to provide an adequate pressure dressing. The patient tolerated the procedure well, was given detailed written and verbal wound care instructions, and was discharged in good condition.   The patient will follow-up: 4 weeks.    Documentation: I have reviewed the above documentation for accuracy and completeness, and I agree with the above.  Aaron CHRISTELLA HOLY, MD

## 2024-06-21 NOTE — Telephone Encounter (Signed)
Got him scheduled

## 2024-06-28 ENCOUNTER — Other Ambulatory Visit: Payer: Self-pay | Admitting: Family

## 2024-06-28 DIAGNOSIS — E782 Mixed hyperlipidemia: Secondary | ICD-10-CM

## 2024-06-30 ENCOUNTER — Ambulatory Visit: Admitting: Dermatology

## 2024-07-15 ENCOUNTER — Other Ambulatory Visit: Payer: Self-pay | Admitting: Cardiology

## 2024-07-15 DIAGNOSIS — I480 Paroxysmal atrial fibrillation: Secondary | ICD-10-CM

## 2024-08-04 ENCOUNTER — Encounter: Payer: Self-pay | Admitting: Cardiology

## 2024-08-10 ENCOUNTER — Ambulatory Visit: Payer: Medicare (Managed Care) | Admitting: Family

## 2024-10-07 ENCOUNTER — Ambulatory Visit: Admitting: Dermatology

## 2025-06-21 ENCOUNTER — Ambulatory Visit
